# Patient Record
Sex: Female | Born: 1945
Health system: Southern US, Community
[De-identification: ages and names within clinical notes are randomized; demographics above are authoritative.]

## PROBLEM LIST (undated history)

## (undated) DIAGNOSIS — N289 Disorder of kidney and ureter, unspecified: Secondary | ICD-10-CM

## (undated) DIAGNOSIS — H3530 Unspecified macular degeneration: Secondary | ICD-10-CM

## (undated) DIAGNOSIS — M1990 Unspecified osteoarthritis, unspecified site: Secondary | ICD-10-CM

## (undated) DIAGNOSIS — Z972 Presence of dental prosthetic device (complete) (partial): Secondary | ICD-10-CM

## (undated) DIAGNOSIS — D649 Anemia, unspecified: Secondary | ICD-10-CM

## (undated) DIAGNOSIS — Z872 Personal history of diseases of the skin and subcutaneous tissue: Secondary | ICD-10-CM

## (undated) DIAGNOSIS — E7800 Pure hypercholesterolemia, unspecified: Secondary | ICD-10-CM

## (undated) DIAGNOSIS — C9000 Multiple myeloma not having achieved remission: Secondary | ICD-10-CM

## (undated) DIAGNOSIS — I10 Essential (primary) hypertension: Secondary | ICD-10-CM

## (undated) HISTORY — DX: Personal history of diseases of the skin and subcutaneous tissue: Z87.2

## (undated) HISTORY — PX: EXPLORATORY LAPAROTOMY: SUR591

## (undated) HISTORY — DX: Unspecified macular degeneration: H35.30

---

## 2005-01-22 ENCOUNTER — Ambulatory Visit: Payer: Self-pay | Admitting: Unknown Physician Specialty

## 2005-02-10 ENCOUNTER — Ambulatory Visit: Payer: Self-pay | Admitting: Unknown Physician Specialty

## 2006-03-11 ENCOUNTER — Ambulatory Visit: Payer: Self-pay | Admitting: Unknown Physician Specialty

## 2007-05-19 ENCOUNTER — Ambulatory Visit: Payer: Self-pay | Admitting: Unknown Physician Specialty

## 2008-05-24 ENCOUNTER — Ambulatory Visit: Payer: Self-pay | Admitting: Unknown Physician Specialty

## 2009-07-30 ENCOUNTER — Ambulatory Visit: Payer: Self-pay | Admitting: Unknown Physician Specialty

## 2010-08-15 ENCOUNTER — Ambulatory Visit: Payer: Self-pay | Admitting: Unknown Physician Specialty

## 2010-08-20 ENCOUNTER — Ambulatory Visit: Payer: Self-pay | Admitting: Internal Medicine

## 2011-10-05 ENCOUNTER — Ambulatory Visit: Payer: Self-pay | Admitting: Unknown Physician Specialty

## 2011-10-28 ENCOUNTER — Ambulatory Visit: Payer: Self-pay | Admitting: Unknown Physician Specialty

## 2013-04-04 DIAGNOSIS — N200 Calculus of kidney: Secondary | ICD-10-CM | POA: Insufficient documentation

## 2013-04-04 DIAGNOSIS — D4100 Neoplasm of uncertain behavior of unspecified kidney: Secondary | ICD-10-CM | POA: Insufficient documentation

## 2013-04-04 DIAGNOSIS — D4120 Neoplasm of uncertain behavior of unspecified ureter: Secondary | ICD-10-CM

## 2013-12-07 ENCOUNTER — Ambulatory Visit: Payer: Self-pay | Admitting: Unknown Physician Specialty

## 2013-12-11 LAB — PATHOLOGY REPORT

## 2014-01-06 ENCOUNTER — Emergency Department: Payer: Self-pay | Admitting: Emergency Medicine

## 2014-01-06 LAB — CBC WITH DIFFERENTIAL/PLATELET
BASOS PCT: 0.2 %
Basophil #: 0 10*3/uL (ref 0.0–0.1)
EOS ABS: 0 10*3/uL (ref 0.0–0.7)
Eosinophil %: 0.2 %
HCT: 40.9 % (ref 35.0–47.0)
HGB: 14 g/dL (ref 12.0–16.0)
Lymphocyte #: 0.4 10*3/uL — ABNORMAL LOW (ref 1.0–3.6)
Lymphocyte %: 3.8 %
MCH: 30.5 pg (ref 26.0–34.0)
MCHC: 34.3 g/dL (ref 32.0–36.0)
MCV: 89 fL (ref 80–100)
MONOS PCT: 3.7 %
Monocyte #: 0.4 x10 3/mm (ref 0.2–0.9)
Neutrophil #: 9.7 10*3/uL — ABNORMAL HIGH (ref 1.4–6.5)
Neutrophil %: 92.1 %
Platelet: 242 10*3/uL (ref 150–440)
RBC: 4.6 10*6/uL (ref 3.80–5.20)
RDW: 13.5 % (ref 11.5–14.5)
WBC: 10.6 10*3/uL (ref 3.6–11.0)

## 2014-01-06 LAB — URINALYSIS, COMPLETE
BILIRUBIN, UR: NEGATIVE
Bacteria: NONE SEEN
Blood: NEGATIVE
Glucose,UR: NEGATIVE mg/dL (ref 0–75)
Leukocyte Esterase: NEGATIVE
Nitrite: NEGATIVE
PH: 6 (ref 4.5–8.0)
Protein: NEGATIVE
RBC, UR: NONE SEEN /HPF (ref 0–5)
Specific Gravity: 1.025 (ref 1.003–1.030)
Squamous Epithelial: NONE SEEN
WBC UR: NONE SEEN /HPF (ref 0–5)

## 2014-01-06 LAB — COMPREHENSIVE METABOLIC PANEL
Albumin: 3.3 g/dL — ABNORMAL LOW (ref 3.4–5.0)
Alkaline Phosphatase: 74 U/L
Anion Gap: 7 (ref 7–16)
BUN: 16 mg/dL (ref 7–18)
Bilirubin,Total: 0.6 mg/dL (ref 0.2–1.0)
CALCIUM: 8.7 mg/dL (ref 8.5–10.1)
CHLORIDE: 101 mmol/L (ref 98–107)
CREATININE: 0.91 mg/dL (ref 0.60–1.30)
Co2: 24 mmol/L (ref 21–32)
EGFR (African American): >60
EGFR (Non-African Amer.): >60
Glucose: 128 mg/dL — ABNORMAL HIGH (ref 65–99)
Osmolality: 267 (ref 275–301)
Potassium: 3.7 mmol/L (ref 3.5–5.1)
SGOT(AST): 70 U/L — ABNORMAL HIGH (ref 15–37)
SGPT (ALT): 68 U/L (ref 12–78)
SODIUM: 132 mmol/L — AB (ref 136–145)
TOTAL PROTEIN: 7.6 g/dL (ref 6.4–8.2)

## 2014-01-06 LAB — LIPASE, BLOOD: Lipase: 103 U/L (ref 73–393)

## 2014-02-19 ENCOUNTER — Ambulatory Visit: Payer: Self-pay

## 2016-01-14 ENCOUNTER — Inpatient Hospital Stay
Admission: RE | Admit: 2016-01-14 | Discharge: 2016-01-14 | Disposition: A | Discharge status: Home / Self Care | Payer: Self-pay | Source: Ambulatory Visit | Attending: *Deleted | Admitting: *Deleted

## 2016-01-14 ENCOUNTER — Other Ambulatory Visit: Payer: Self-pay | Admitting: *Deleted

## 2016-01-14 ENCOUNTER — Other Ambulatory Visit: Payer: Self-pay | Admitting: Nurse Practitioner

## 2016-01-14 DIAGNOSIS — Z9289 Personal history of other medical treatment: Secondary | ICD-10-CM

## 2016-01-14 DIAGNOSIS — N63 Unspecified lump in unspecified breast: Secondary | ICD-10-CM

## 2016-01-14 DIAGNOSIS — N6489 Other specified disorders of breast: Secondary | ICD-10-CM

## 2016-01-24 ENCOUNTER — Ambulatory Visit
Admission: RE | Admit: 2016-01-24 | Discharge: 2016-01-24 | Disposition: A | Discharge status: Home / Self Care | Payer: Commercial Managed Care - HMO | Source: Ambulatory Visit | Attending: Nurse Practitioner | Admitting: Nurse Practitioner

## 2016-01-24 ENCOUNTER — Other Ambulatory Visit: Payer: Self-pay | Admitting: Nurse Practitioner

## 2016-01-24 DIAGNOSIS — N6489 Other specified disorders of breast: Secondary | ICD-10-CM

## 2016-01-24 DIAGNOSIS — N644 Mastodynia: Secondary | ICD-10-CM | POA: Diagnosis present

## 2016-01-24 DIAGNOSIS — N63 Unspecified lump in unspecified breast: Secondary | ICD-10-CM

## 2016-09-30 ENCOUNTER — Other Ambulatory Visit: Payer: Self-pay | Admitting: Nurse Practitioner

## 2016-09-30 DIAGNOSIS — Z1231 Encounter for screening mammogram for malignant neoplasm of breast: Secondary | ICD-10-CM

## 2016-09-30 DIAGNOSIS — E2839 Other primary ovarian failure: Secondary | ICD-10-CM

## 2016-10-29 DIAGNOSIS — E785 Hyperlipidemia, unspecified: Secondary | ICD-10-CM | POA: Insufficient documentation

## 2016-10-29 DIAGNOSIS — E782 Mixed hyperlipidemia: Secondary | ICD-10-CM | POA: Insufficient documentation

## 2016-11-06 ENCOUNTER — Emergency Department: Payer: Commercial Managed Care - HMO

## 2016-11-06 ENCOUNTER — Emergency Department
Admission: EM | Admit: 2016-11-06 | Discharge: 2016-11-06 | Disposition: A | Discharge status: Home / Self Care | Payer: Commercial Managed Care - HMO | Attending: Emergency Medicine | Admitting: Emergency Medicine

## 2016-11-06 ENCOUNTER — Encounter: Payer: Self-pay | Admitting: Emergency Medicine

## 2016-11-06 DIAGNOSIS — J069 Acute upper respiratory infection, unspecified: Secondary | ICD-10-CM | POA: Insufficient documentation

## 2016-11-06 DIAGNOSIS — R05 Cough: Secondary | ICD-10-CM | POA: Diagnosis present

## 2016-11-06 HISTORY — DX: Disorder of kidney and ureter, unspecified: N28.9

## 2016-11-06 HISTORY — DX: Anemia, unspecified: D64.9

## 2016-11-06 HISTORY — DX: Pure hypercholesterolemia, unspecified: E78.00

## 2016-11-06 HISTORY — DX: Multiple myeloma not having achieved remission: C90.00

## 2016-11-06 HISTORY — DX: Essential (primary) hypertension: I10

## 2016-11-06 LAB — CBC WITH DIFFERENTIAL/PLATELET
Basophils Absolute: 0.1 10*3/uL (ref 0–0.1)
Basophils Relative: 1 %
EOS PCT: 1 %
Eosinophils Absolute: 0.1 10*3/uL (ref 0–0.7)
HCT: 32.7 % — ABNORMAL LOW (ref 35.0–47.0)
Hemoglobin: 11 g/dL — ABNORMAL LOW (ref 12.0–16.0)
LYMPHS ABS: 3 10*3/uL (ref 1.0–3.6)
LYMPHS PCT: 21 %
MCH: 32.1 pg (ref 26.0–34.0)
MCHC: 33.5 g/dL (ref 32.0–36.0)
MCV: 95.9 fL (ref 80.0–100.0)
MONO ABS: 1.2 10*3/uL — AB (ref 0.2–0.9)
MONOS PCT: 8 %
Neutro Abs: 10.4 10*3/uL — ABNORMAL HIGH (ref 1.4–6.5)
Neutrophils Relative %: 69 %
PLATELETS: 236 10*3/uL (ref 150–440)
RBC: 3.41 MIL/uL — ABNORMAL LOW (ref 3.80–5.20)
RDW: 14.4 % (ref 11.5–14.5)
WBC: 14.8 10*3/uL — ABNORMAL HIGH (ref 3.6–11.0)

## 2016-11-06 LAB — COMPREHENSIVE METABOLIC PANEL
ALT: 17 U/L (ref 14–54)
ANION GAP: 5 (ref 5–15)
AST: 31 U/L (ref 15–41)
Albumin: 3.3 g/dL — ABNORMAL LOW (ref 3.5–5.0)
Alkaline Phosphatase: 56 U/L (ref 38–126)
BUN: 11 mg/dL (ref 6–20)
CHLORIDE: 104 mmol/L (ref 101–111)
CO2: 23 mmol/L (ref 22–32)
CREATININE: 1.2 mg/dL — AB (ref 0.44–1.00)
Calcium: 9.8 mg/dL (ref 8.9–10.3)
GFR, EST AFRICAN AMERICAN: 52 mL/min — AB (ref >60)
GFR, EST NON AFRICAN AMERICAN: 45 mL/min — AB (ref >60)
Glucose, Bld: 109 mg/dL — ABNORMAL HIGH (ref 65–99)
POTASSIUM: 3.9 mmol/L (ref 3.5–5.1)
Sodium: 132 mmol/L — ABNORMAL LOW (ref 135–145)
Total Bilirubin: 0.3 mg/dL (ref 0.3–1.2)
Total Protein: 11.1 g/dL — ABNORMAL HIGH (ref 6.5–8.1)

## 2016-11-06 LAB — TROPONIN I: Troponin I: <0.03 ng/mL (ref <0.03)

## 2016-11-06 MED ORDER — ALBUTEROL SULFATE HFA 108 (90 BASE) MCG/ACT IN AERS: 2.0000 | INHALATION_SPRAY | Freq: Four times a day (QID) | RESPIRATORY_TRACT | 0 refills | Status: DC | PRN

## 2016-11-06 MED ORDER — IPRATROPIUM-ALBUTEROL 0.5-2.5 (3) MG/3ML IN SOLN
3.0000 mL | Freq: Once | RESPIRATORY_TRACT | Status: AC
  Administered 2016-11-06: 3 mL via RESPIRATORY_TRACT
  Filled 2016-11-06: qty 3

## 2016-11-06 MED ORDER — AZITHROMYCIN 250 MG PO TABS: ORAL_TABLET | ORAL | 0 refills | Status: AC

## 2016-11-06 NOTE — ED Triage Notes (Signed)
Patient presents with productive cough, nasal congestion, fever since monday. Patient states she took 593m tylenol at 0700 this morning. Patient afebrile now. Patient states she was recently diagnosed with multiple myeloma but it has not been staged yet, awaiting pet scan. ([facility]

## 2016-11-06 NOTE — ED Provider Notes (Signed)
[facility] Emergency Department Provider Note   ____________________________________________   I have reviewed the triage vital signs and the nursing notes.   HISTORY  Chief Complaint Cough   History limited by: Not Limited   HPI Sherry Oneal is a 70 y.o. female who presents to the emergency department today because of concern for cough, congestion and fever. The patient states that the symptoms started roughly 4 days ago. She initially was having congestion in her head but feels like it moved down to her chest last night. The patient has had a cough since last night. When she coughs she feels a burning sensation in the middle of her chest. She has had fever up to 101. Took tylenol for the fever this morning.    Past Medical History:  Diagnosis Date  . Anemia   . High blood pressure   . High cholesterol   . Multiple myeloma ([place])   . Renal insufficiency     There are no active problems to display for this patient.   Past Surgical History:  Procedure Laterality Date  . EXPLORATORY LAPAROTOMY      Prior to Admission medications   Not on File    Allergies Atenolol; Doxycycline; Epinephrine; Nsaids; Penicillins; Sulfa antibiotics; and Tramadol  Family History  Problem Relation Age of Onset  . Breast cancer Maternal Grandmother 70    Social History Social History  Substance Use Topics  . Smoking status: Never Smoker  . Smokeless tobacco: Never Used  . Alcohol use No    Review of Systems  Constitutional: Negative for fever. Cardiovascular: Positive for burning sensation. Respiratory: Positive for cough. Gastrointestinal: Negative for abdominal pain, vomiting and diarrhea. Genitourinary: Negative for dysuria. Musculoskeletal: Negative for back pain. Skin: Negative for rash. Neurological: Negative for headaches, focal weakness or numbness.  10-point ROS otherwise  negative.  ____________________________________________   PHYSICAL EXAM:  VITAL SIGNS: ED Triage Vitals  Enc Vitals Group     BP 11/06/16 0809 (!) 123/45     Pulse --      Resp 11/06/16 0809 20     Temp 11/06/16 0809 98.3 F (36.8 C)     Temp Source 11/06/16 0809 Oral     SpO2 11/06/16 0809 99 %     Weight 11/06/16 0809 138 lb (62.6 kg)     Height 11/06/16 0809 _0  (1.626 m)     Head Circumference --      Peak Flow --      Pain Score 11/06/16 0810 6   Constitutional: Alert and oriented. Well appearing and in no distress. Eyes: Conjunctivae are normal. Normal extraocular movements. ENT   Head: Normocephalic and atraumatic.   Nose: No congestion/rhinnorhea.   Mouth/Throat: Mucous membranes are moist.   Neck: No stridor. Hematological/Lymphatic/Immunilogical: No cervical lymphadenopathy. Cardiovascular: Normal rate, regular rhythm.  No murmurs, rubs, or gallops.  Respiratory: Normal respiratory effort without tachypnea nor retractions. Breath sounds are clear and equal bilaterally. No wheezes/rales/rhonchi. Occasional dry cough. Gastrointestinal: Soft and nontender. No distention.  Genitourinary: Deferred Musculoskeletal: Normal range of motion in all extremities. No lower extremity edema. Neurologic:  Normal speech and language. No gross focal neurologic deficits are appreciated.  Skin:  Skin is warm, dry and intact. No rash noted. Psychiatric: Mood and affect are normal. Speech and behavior are normal. Patient exhibits appropriate insight and judgment.  ____________________________________________    LABS (pertinent positives/negatives)  Labs Reviewed  CBC WITH DIFFERENTIAL/PLATELET - Abnormal; Notable for the following:  Result Value   WBC 14.8 (*)    RBC 3.41 (*)    Hemoglobin 11.0 (*)    HCT 32.7 (*)    Neutro Abs 10.4 (*)    Monocytes Absolute 1.2 (*)    All other components within normal limits  COMPREHENSIVE METABOLIC PANEL - Abnormal;  Notable for the following:    Sodium 132 (*)    Glucose, Bld 109 (*)    Creatinine, Ser 1.20 (*)    Total Protein 11.1 (*)    Albumin 3.3 (*)    GFR calc non Af Amer 45 (*)    GFR calc Af Amer 52 (*)    All other components within normal limits  TROPONIN I      ____________________________________________    RADIOLOGY  CXR IMPRESSION:  No acute abnormality.    ____________________________________________   PROCEDURES  Procedures  ____________________________________________   INITIAL IMPRESSION / ASSESSMENT AND PLAN / ED COURSE  Pertinent labs & imaging results that were available during my care of the patient were reviewed by me and considered in my medical decision making (see chart for details).  Patient here with congestion, cough, fever. X-ray without pneumonia, patient with mild elevation of WBCs. She did state she felt better after duoneb treatment. Will discharge with albuterol inhaler and z-pak.  ____________________________________________   FINAL CLINICAL IMPRESSION(S) / ED DIAGNOSES  Final diagnoses:  Upper respiratory tract infection, unspecified type     Note: This dictation was prepared with Dragon dictation. Any transcriptional errors that result from this process are unintentional    Nance Pear, MD 11/06/16 1129

## 2016-11-06 NOTE — ED Notes (Signed)
Patient reports cough since Tuesday. Patient states that it burns her chest. Patient states that she has been running fever, temperature was up to 101 last pm. Patient took tylenol for fever. Patient states that cough is mostly non-productive but sometimes she does get up white sputum. Patient has had some shortness of breath this morning when she had a coughing spell.

## 2016-11-06 NOTE — Discharge Instructions (Signed)
Please seek medical attention for any high fevers, chest pain, shortness of breath, change in behavior, persistent vomiting, bloody stool or any other new or concerning symptoms.  

## 2016-12-29 DIAGNOSIS — T380X5D Adverse effect of glucocorticoids and synthetic analogues, subsequent encounter: Secondary | ICD-10-CM | POA: Diagnosis not present

## 2016-12-29 DIAGNOSIS — E785 Hyperlipidemia, unspecified: Secondary | ICD-10-CM | POA: Diagnosis not present

## 2016-12-29 DIAGNOSIS — G4709 Other insomnia: Secondary | ICD-10-CM | POA: Diagnosis not present

## 2016-12-29 DIAGNOSIS — I10 Essential (primary) hypertension: Secondary | ICD-10-CM | POA: Diagnosis not present

## 2016-12-29 DIAGNOSIS — E041 Nontoxic single thyroid nodule: Secondary | ICD-10-CM | POA: Diagnosis not present

## 2016-12-29 DIAGNOSIS — K5909 Other constipation: Secondary | ICD-10-CM | POA: Diagnosis not present

## 2016-12-29 DIAGNOSIS — N2889 Other specified disorders of kidney and ureter: Secondary | ICD-10-CM | POA: Diagnosis not present

## 2016-12-29 DIAGNOSIS — C9000 Multiple myeloma not having achieved remission: Secondary | ICD-10-CM | POA: Diagnosis not present

## 2016-12-29 DIAGNOSIS — Z5112 Encounter for antineoplastic immunotherapy: Secondary | ICD-10-CM | POA: Diagnosis not present

## 2016-12-29 DIAGNOSIS — R897 Abnormal histological findings in specimens from other organs, systems and tissues: Secondary | ICD-10-CM | POA: Diagnosis not present

## 2016-12-30 DIAGNOSIS — R69 Illness, unspecified: Secondary | ICD-10-CM | POA: Diagnosis not present

## 2017-01-01 DIAGNOSIS — C9000 Multiple myeloma not having achieved remission: Secondary | ICD-10-CM | POA: Diagnosis not present

## 2017-01-01 DIAGNOSIS — Z880 Allergy status to penicillin: Secondary | ICD-10-CM | POA: Diagnosis not present

## 2017-01-01 DIAGNOSIS — Z5112 Encounter for antineoplastic immunotherapy: Secondary | ICD-10-CM | POA: Diagnosis not present

## 2017-01-01 DIAGNOSIS — Z885 Allergy status to narcotic agent status: Secondary | ICD-10-CM | POA: Diagnosis not present

## 2017-01-01 DIAGNOSIS — Z882 Allergy status to sulfonamides status: Secondary | ICD-10-CM | POA: Diagnosis not present

## 2017-01-01 DIAGNOSIS — L509 Urticaria, unspecified: Secondary | ICD-10-CM | POA: Diagnosis not present

## 2017-01-01 DIAGNOSIS — N261 Atrophy of kidney (terminal): Secondary | ICD-10-CM | POA: Diagnosis not present

## 2017-01-01 DIAGNOSIS — L299 Pruritus, unspecified: Secondary | ICD-10-CM | POA: Diagnosis not present

## 2017-01-05 DIAGNOSIS — Z880 Allergy status to penicillin: Secondary | ICD-10-CM | POA: Diagnosis not present

## 2017-01-05 DIAGNOSIS — Z882 Allergy status to sulfonamides status: Secondary | ICD-10-CM | POA: Diagnosis not present

## 2017-01-05 DIAGNOSIS — Z886 Allergy status to analgesic agent status: Secondary | ICD-10-CM | POA: Diagnosis not present

## 2017-01-05 DIAGNOSIS — Z885 Allergy status to narcotic agent status: Secondary | ICD-10-CM | POA: Diagnosis not present

## 2017-01-05 DIAGNOSIS — C9000 Multiple myeloma not having achieved remission: Secondary | ICD-10-CM | POA: Diagnosis not present

## 2017-01-08 DIAGNOSIS — C9000 Multiple myeloma not having achieved remission: Secondary | ICD-10-CM | POA: Diagnosis not present

## 2017-01-08 DIAGNOSIS — Z5111 Encounter for antineoplastic chemotherapy: Secondary | ICD-10-CM | POA: Diagnosis not present

## 2017-01-19 DIAGNOSIS — N289 Disorder of kidney and ureter, unspecified: Secondary | ICD-10-CM | POA: Diagnosis not present

## 2017-01-19 DIAGNOSIS — E041 Nontoxic single thyroid nodule: Secondary | ICD-10-CM | POA: Diagnosis not present

## 2017-01-19 DIAGNOSIS — Z880 Allergy status to penicillin: Secondary | ICD-10-CM | POA: Diagnosis not present

## 2017-01-19 DIAGNOSIS — R110 Nausea: Secondary | ICD-10-CM | POA: Diagnosis not present

## 2017-01-19 DIAGNOSIS — C9000 Multiple myeloma not having achieved remission: Secondary | ICD-10-CM | POA: Diagnosis not present

## 2017-01-19 DIAGNOSIS — Z886 Allergy status to analgesic agent status: Secondary | ICD-10-CM | POA: Diagnosis not present

## 2017-01-19 DIAGNOSIS — I10 Essential (primary) hypertension: Secondary | ICD-10-CM | POA: Diagnosis not present

## 2017-01-19 DIAGNOSIS — E785 Hyperlipidemia, unspecified: Secondary | ICD-10-CM | POA: Diagnosis not present

## 2017-01-19 DIAGNOSIS — G4700 Insomnia, unspecified: Secondary | ICD-10-CM | POA: Diagnosis not present

## 2017-01-19 DIAGNOSIS — Z5112 Encounter for antineoplastic immunotherapy: Secondary | ICD-10-CM | POA: Diagnosis not present

## 2017-01-22 DIAGNOSIS — Z5112 Encounter for antineoplastic immunotherapy: Secondary | ICD-10-CM | POA: Diagnosis not present

## 2017-01-22 DIAGNOSIS — C9000 Multiple myeloma not having achieved remission: Secondary | ICD-10-CM | POA: Diagnosis not present

## 2017-01-22 DIAGNOSIS — Z79899 Other long term (current) drug therapy: Secondary | ICD-10-CM | POA: Diagnosis not present

## 2017-01-25 ENCOUNTER — Ambulatory Visit
Admission: RE | Admit: 2017-01-25 | Discharge: 2017-01-25 | Disposition: A | Discharge status: Home / Self Care | Payer: Medicare HMO | Source: Ambulatory Visit | Attending: Nurse Practitioner | Admitting: Nurse Practitioner

## 2017-01-25 DIAGNOSIS — Z1231 Encounter for screening mammogram for malignant neoplasm of breast: Secondary | ICD-10-CM | POA: Diagnosis not present

## 2017-01-25 DIAGNOSIS — E2839 Other primary ovarian failure: Secondary | ICD-10-CM

## 2017-01-26 DIAGNOSIS — Z5112 Encounter for antineoplastic immunotherapy: Secondary | ICD-10-CM | POA: Diagnosis not present

## 2017-01-26 DIAGNOSIS — C9000 Multiple myeloma not having achieved remission: Secondary | ICD-10-CM | POA: Diagnosis not present

## 2017-01-29 DIAGNOSIS — Z5112 Encounter for antineoplastic immunotherapy: Secondary | ICD-10-CM | POA: Diagnosis not present

## 2017-01-29 DIAGNOSIS — Z880 Allergy status to penicillin: Secondary | ICD-10-CM | POA: Diagnosis not present

## 2017-01-29 DIAGNOSIS — I10 Essential (primary) hypertension: Secondary | ICD-10-CM | POA: Diagnosis not present

## 2017-01-29 DIAGNOSIS — Z882 Allergy status to sulfonamides status: Secondary | ICD-10-CM | POA: Diagnosis not present

## 2017-01-29 DIAGNOSIS — E785 Hyperlipidemia, unspecified: Secondary | ICD-10-CM | POA: Diagnosis not present

## 2017-01-29 DIAGNOSIS — Z79899 Other long term (current) drug therapy: Secondary | ICD-10-CM | POA: Diagnosis not present

## 2017-01-29 DIAGNOSIS — E041 Nontoxic single thyroid nodule: Secondary | ICD-10-CM | POA: Diagnosis not present

## 2017-01-29 DIAGNOSIS — C9000 Multiple myeloma not having achieved remission: Secondary | ICD-10-CM | POA: Diagnosis not present

## 2017-01-29 DIAGNOSIS — N289 Disorder of kidney and ureter, unspecified: Secondary | ICD-10-CM | POA: Diagnosis not present

## 2017-01-29 DIAGNOSIS — Z7901 Long term (current) use of anticoagulants: Secondary | ICD-10-CM | POA: Diagnosis not present

## 2017-01-30 ENCOUNTER — Emergency Department: Payer: Medicare HMO

## 2017-01-30 ENCOUNTER — Observation Stay: Payer: Medicare HMO

## 2017-01-30 ENCOUNTER — Observation Stay
Admission: EM | Admit: 2017-01-30 | Discharge: 2017-02-01 | Disposition: A | Discharge status: Home / Self Care | Payer: Medicare HMO | Attending: Internal Medicine | Admitting: Internal Medicine

## 2017-01-30 DIAGNOSIS — E7800 Pure hypercholesterolemia, unspecified: Secondary | ICD-10-CM | POA: Insufficient documentation

## 2017-01-30 DIAGNOSIS — D62 Acute posthemorrhagic anemia: Secondary | ICD-10-CM

## 2017-01-30 DIAGNOSIS — K922 Gastrointestinal hemorrhage, unspecified: Secondary | ICD-10-CM

## 2017-01-30 DIAGNOSIS — I10 Essential (primary) hypertension: Secondary | ICD-10-CM | POA: Diagnosis not present

## 2017-01-30 DIAGNOSIS — R197 Diarrhea, unspecified: Secondary | ICD-10-CM | POA: Diagnosis present

## 2017-01-30 DIAGNOSIS — F419 Anxiety disorder, unspecified: Secondary | ICD-10-CM | POA: Insufficient documentation

## 2017-01-30 DIAGNOSIS — K629 Disease of anus and rectum, unspecified: Secondary | ICD-10-CM | POA: Diagnosis not present

## 2017-01-30 DIAGNOSIS — K529 Noninfective gastroenteritis and colitis, unspecified: Secondary | ICD-10-CM | POA: Diagnosis not present

## 2017-01-30 DIAGNOSIS — Z7952 Long term (current) use of systemic steroids: Secondary | ICD-10-CM | POA: Diagnosis not present

## 2017-01-30 DIAGNOSIS — R69 Illness, unspecified: Secondary | ICD-10-CM | POA: Diagnosis not present

## 2017-01-30 DIAGNOSIS — D630 Anemia in neoplastic disease: Secondary | ICD-10-CM | POA: Diagnosis not present

## 2017-01-30 DIAGNOSIS — R109 Unspecified abdominal pain: Secondary | ICD-10-CM | POA: Diagnosis not present

## 2017-01-30 DIAGNOSIS — C9000 Multiple myeloma not having achieved remission: Secondary | ICD-10-CM | POA: Diagnosis not present

## 2017-01-30 DIAGNOSIS — K921 Melena: Secondary | ICD-10-CM | POA: Diagnosis not present

## 2017-01-30 DIAGNOSIS — K625 Hemorrhage of anus and rectum: Secondary | ICD-10-CM

## 2017-01-30 DIAGNOSIS — Z79899 Other long term (current) drug therapy: Secondary | ICD-10-CM | POA: Diagnosis not present

## 2017-01-30 LAB — HEMOGLOBIN: Hemoglobin: 9.4 g/dL — ABNORMAL LOW (ref 12.0–16.0)

## 2017-01-30 LAB — COMPREHENSIVE METABOLIC PANEL
ALBUMIN: 3.1 g/dL — AB (ref 3.5–5.0)
ALT: 22 U/L (ref 14–54)
AST: 26 U/L (ref 15–41)
Alkaline Phosphatase: 64 U/L (ref 38–126)
Anion gap: 6 (ref 5–15)
BUN: 24 mg/dL — AB (ref 6–20)
CHLORIDE: 104 mmol/L (ref 101–111)
CO2: 20 mmol/L — ABNORMAL LOW (ref 22–32)
Calcium: 8.4 mg/dL — ABNORMAL LOW (ref 8.9–10.3)
Creatinine, Ser: 1.14 mg/dL — ABNORMAL HIGH (ref 0.44–1.00)
GFR calc Af Amer: 55 mL/min — ABNORMAL LOW (ref >60)
GFR calc non Af Amer: 48 mL/min — ABNORMAL LOW (ref >60)
GLUCOSE: 122 mg/dL — AB (ref 65–99)
POTASSIUM: 3.3 mmol/L — AB (ref 3.5–5.1)
SODIUM: 130 mmol/L — AB (ref 135–145)
Total Bilirubin: 0.5 mg/dL (ref 0.3–1.2)
Total Protein: 9.1 g/dL — ABNORMAL HIGH (ref 6.5–8.1)

## 2017-01-30 LAB — CBC
HEMATOCRIT: 29.7 % — AB (ref 35.0–47.0)
Hemoglobin: 10.4 g/dL — ABNORMAL LOW (ref 12.0–16.0)
MCH: 33.6 pg (ref 26.0–34.0)
MCHC: 35 g/dL (ref 32.0–36.0)
MCV: 95.8 fL (ref 80.0–100.0)
Platelets: 183 10*3/uL (ref 150–440)
RBC: 3.1 MIL/uL — ABNORMAL LOW (ref 3.80–5.20)
RDW: 16.5 % — AB (ref 11.5–14.5)
WBC: 13.7 10*3/uL — AB (ref 3.6–11.0)

## 2017-01-30 LAB — TYPE AND SCREEN
ABO/RH(D): O POS
Antibody Screen: NEGATIVE

## 2017-01-30 LAB — APTT: APTT: 25 s (ref 24–36)

## 2017-01-30 LAB — PROTIME-INR
INR: 1.02
Prothrombin Time: 13.4 seconds (ref 11.4–15.2)

## 2017-01-30 MED ORDER — POTASSIUM CHLORIDE IN NACL 20-0.9 MEQ/L-% IV SOLN
INTRAVENOUS | Status: DC
  Administered 2017-01-30 – 2017-01-31 (×3): via INTRAVENOUS
  Filled 2017-01-30 (×6): qty 1000

## 2017-01-30 MED ORDER — FENTANYL CITRATE (PF) 100 MCG/2ML IJ SOLN: 50.0000 ug | Freq: Once | INTRAMUSCULAR | Status: DC

## 2017-01-30 MED ORDER — ONDANSETRON HCL 4 MG PO TABS
4.0000 mg | ORAL_TABLET | Freq: Four times a day (QID) | ORAL | Status: DC | PRN
  Administered 2017-01-30: 4 mg via ORAL
  Filled 2017-01-30: qty 1

## 2017-01-30 MED ORDER — ALBUTEROL SULFATE (2.5 MG/3ML) 0.083% IN NEBU: 3.0000 mL | INHALATION_SOLUTION | Freq: Four times a day (QID) | RESPIRATORY_TRACT | Status: DC | PRN

## 2017-01-30 MED ORDER — ACETAMINOPHEN 650 MG RE SUPP: 650.0000 mg | Freq: Four times a day (QID) | RECTAL | Status: DC | PRN

## 2017-01-30 MED ORDER — VALACYCLOVIR HCL 500 MG PO TABS
500.0000 mg | ORAL_TABLET | Freq: Every day | ORAL | Status: DC
Start: 2017-01-30 — End: 2017-02-01
  Administered 2017-01-30 – 2017-02-01 (×3): 500 mg via ORAL
  Filled 2017-01-30 (×3): qty 1

## 2017-01-30 MED ORDER — POTASSIUM CHLORIDE IN NACL 20-0.9 MEQ/L-% IV SOLN
INTRAVENOUS | Status: AC
  Filled 2017-01-30: qty 1000

## 2017-01-30 MED ORDER — LISINOPRIL 10 MG PO TABS
10.0000 mg | ORAL_TABLET | Freq: Every day | ORAL | Status: DC
Start: 2017-01-30 — End: 2017-02-01
  Administered 2017-01-30 – 2017-02-01 (×3): 10 mg via ORAL
  Filled 2017-01-30 (×3): qty 1

## 2017-01-30 MED ORDER — ALPRAZOLAM 0.25 MG PO TABS
0.2500 mg | ORAL_TABLET | Freq: Three times a day (TID) | ORAL | Status: DC | PRN
  Administered 2017-01-30: 19:00:00 0.25 mg via ORAL
  Filled 2017-01-30: qty 1

## 2017-01-30 MED ORDER — BISACODYL 10 MG RE SUPP: 10.0000 mg | Freq: Every day | RECTAL | Status: DC | PRN

## 2017-01-30 MED ORDER — DOCUSATE SODIUM 100 MG PO CAPS
100.0000 mg | ORAL_CAPSULE | Freq: Two times a day (BID) | ORAL | Status: DC
  Filled 2017-01-30 (×2): qty 1

## 2017-01-30 MED ORDER — CIPROFLOXACIN IN D5W 400 MG/200ML IV SOLN
400.0000 mg | Freq: Two times a day (BID) | INTRAVENOUS | Status: DC
  Administered 2017-01-30 – 2017-02-01 (×5): 400 mg via INTRAVENOUS
  Filled 2017-01-30 (×6): qty 200

## 2017-01-30 MED ORDER — METRONIDAZOLE IN NACL 5-0.79 MG/ML-% IV SOLN
500.0000 mg | Freq: Three times a day (TID) | INTRAVENOUS | Status: DC
  Administered 2017-01-30 – 2017-02-01 (×6): 500 mg via INTRAVENOUS
  Filled 2017-01-30 (×9): qty 100

## 2017-01-30 MED ORDER — IOPAMIDOL (ISOVUE-300) INJECTION 61%: 30.0000 mL | Freq: Once | INTRAVENOUS | Status: DC | PRN

## 2017-01-30 MED ORDER — BARIUM SULFATE 2.1 % PO SUSP
450.0000 mL | ORAL | Status: AC
  Administered 2017-01-30 (×2): 450 mL via ORAL

## 2017-01-30 MED ORDER — ACETAMINOPHEN 325 MG PO TABS
650.0000 mg | ORAL_TABLET | Freq: Four times a day (QID) | ORAL | Status: DC | PRN
  Administered 2017-01-30 – 2017-01-31 (×3): 650 mg via ORAL
  Filled 2017-01-30 (×3): qty 2

## 2017-01-30 MED ORDER — ACETAMINOPHEN 325 MG PO TABS
650.0000 mg | ORAL_TABLET | Freq: Once | ORAL | Status: AC
  Administered 2017-01-30: 650 mg via ORAL
  Filled 2017-01-30: qty 2

## 2017-01-30 MED ORDER — OXYCODONE HCL 5 MG PO TABS: 5.0000 mg | ORAL_TABLET | Freq: Once | ORAL | Status: DC

## 2017-01-30 MED ORDER — ONDANSETRON HCL 4 MG/2ML IJ SOLN
4.0000 mg | Freq: Four times a day (QID) | INTRAMUSCULAR | Status: DC | PRN
  Administered 2017-01-30: 4 mg via INTRAVENOUS
  Filled 2017-01-30: qty 2

## 2017-01-30 MED ORDER — PANTOPRAZOLE SODIUM 40 MG PO TBEC
40.0000 mg | DELAYED_RELEASE_TABLET | Freq: Every day | ORAL | Status: DC
  Administered 2017-01-31 – 2017-02-01 (×2): 40 mg via ORAL
  Filled 2017-01-30 (×2): qty 1

## 2017-01-30 NOTE — ED Notes (Signed)
Report given to Maddie, RN on floor. Pt to be transferred after CT Scan. She is about to go in a few minutes. Husband called per pt's request to let him know what the patients room assignment.

## 2017-01-30 NOTE — ED Triage Notes (Signed)
Pt came to ED c/o diarrhea, abdominal pain and rectal bleeding this morning. Pt has multiple myolemma and was seen yesterday for chemo (end of 3rd round). Pt reports was having trouble with constipation so started taking miralax and having diarrhea.

## 2017-01-30 NOTE — Care Management Obs Status (Signed)
[place] NOTIFICATION   Patient Details  Name: Sherry Oneal MRN: [id number] Date of Birth: 23-Aug-1946   Medicare Observation Status Notification Given:  Yes    [name], RN 01/30/2017, 4:13 PM

## 2017-01-30 NOTE — H&P (Signed)
History and Physical    Sherry Oneal [id number] DOB: 09-Oct-1946 DOA: 01/30/2017  Referring physician: Dr. Burlene Arnt PCP: Lavera Guise, MD  Specialists: none  Chief Complaint: lower abdominal pain with bleeding  HPI: Sherry Oneal is a 71 y.o. female has a past medical history significant for multiple myeloma on chemo and chronic Lovenox now with lower abdominal pain with diarrhea and rectal bleeding. Pt c/o fever and chills. In ER, rectal bleeding noted. Pt tachycardic and hgb lower than baseline. She is now admitted. No n/V. Denies CP or SOB. No cough. Last Lovenox shot was yesterday  Review of Systems: The patient denies anorexia,  weight loss,, vision loss, decreased hearing, hoarseness, chest pain, syncope, dyspnea on exertion, peripheral edema, balance deficits, hemoptysis,  Melena, severe indigestion/heartburn, hematuria, incontinence, genital sores, muscle weakness, suspicious skin lesions, transient blindness, difficulty walking, depression, unusual weight change, abnormal bleeding, enlarged lymph nodes, angioedema, and breast masses.   Past Medical History:  Diagnosis Date  . Anemia   . High blood pressure   . High cholesterol   . Multiple myeloma ([place])   . Renal insufficiency    Past Surgical History:  Procedure Laterality Date  . EXPLORATORY LAPAROTOMY     Social History:  reports that she has never smoked. She has never used smokeless tobacco. She reports that she does not drink alcohol or use drugs.  Allergies  Allergen Reactions  . Atenolol     "wierd feeling"  . Doxycycline     Upset stomach   . Epinephrine     "Shaking"  . Nsaids     Bleeding  . Penicillins     rash  . Sulfa Antibiotics     Rash   . Tramadol     Numb feeling     Family History  Problem Relation Age of Onset  . Breast cancer Maternal Grandmother 70    Prior to Admission medications   Medication Sig Start Date End Date Taking? Authorizing Provider  albuterol (PROVENTIL  HFA;VENTOLIN HFA) 108 (90 Base) MCG/ACT inhaler Inhale 2 puffs into the lungs every 6 (six) hours as needed for wheezing or shortness of breath. 11/06/16   Nance Pear, MD   Physical Exam: Vitals:   01/30/17 0957 01/30/17 0958  BP: (!) 121/47   Pulse: (!) 105   Resp: 16   Temp: 97.7 F (36.5 C)   SpO2: 99%   Weight:  62.6 kg (138 lb)  Height:  _0  (1.626 m)     General:  No apparent distress, WDWN, [place]/AT  Eyes: PERRL, EOMI, no scleral icterus, conjunctiva clear  ENT: moist oropharynx without exudate, TM's benign, dentition good  Neck: supple, no lymphadenopathy. No bruits or thyromegaly  Cardiovascular: rapid rate with regular rhythm without MRG; 2+ peripheral pulses, no JVD, no peripheral edema  Respiratory: CTA biL, good air movement without wheezing, rhonchi or crackled. Respiratory effort normal  Abdomen: soft, non tender to palpation, positive bowel sounds, no guarding, no rebound  Skin: no rashes or lesions  Musculoskeletal: normal bulk and tone, no joint swelling  Psychiatric: normal mood and affect, A&OX3  Neurologic: CN 2-12 grossly intact, Motor strength 5/5 in all 4 groups with symmetric DTR's and non-focal sensory exam  Labs on Admission:  Basic Metabolic Panel:  Recent Labs Lab 01/30/17 1027  NA 130*  K 3.3*  CL 104  CO2 20*  GLUCOSE 122*  BUN 24*  CREATININE 1.14*  CALCIUM 8.4*   Liver Function Tests:  Recent Labs  Lab 01/30/17 1027  AST 26  ALT 22  ALKPHOS 64  BILITOT 0.5  PROT 9.1*  ALBUMIN 3.1*   No results for input(s): LIPASE, AMYLASE in the last 168 hours. No results for input(s): AMMONIA in the last 168 hours. CBC:  Recent Labs Lab 01/30/17 1027  WBC 13.7*  HGB 10.4*  HCT 29.7*  MCV 95.8  PLT 183   Cardiac Enzymes: No results for input(s): CKTOTAL, CKMB, CKMBINDEX, TROPONINI in the last 168 hours.  BNP (last 3 results) No results for input(s): BNP in the last 8760 hours.  ProBNP (last 3 results) No results  for input(s): PROBNP in the last 8760 hours.  CBG: No results for input(s): GLUCAP in the last 168 hours.  Radiological Exams on Admission: No results found.  EKG: Independently reviewed.  Assessment/Plan Principal Problem:   LGI bleed Active Problems:   Acute blood loss anemia   Abdominal pain   Multiple myeloma ([place])   Will admit to floor with IV fluids and empiric IV ABX. Guaiac stools and follow hgb closely. Hold Lovenox. Consult Oncology and GI. Repeat labs in AM.  Diet: clear liquids Fluids: NS_0  with K+ DVT Prophylaxis: none  Code Status: FULL  Family Communication: yes  Disposition Plan: home  Time spent: 55 min

## 2017-01-30 NOTE — ED Notes (Signed)
Pt escorted to bedside commode.

## 2017-01-30 NOTE — ED Provider Notes (Signed)
[facility] Emergency Department Provider Note  ____________________________________________   I have reviewed the triage vital signs and the nursing notes.   HISTORY  Chief Complaint Diarrhea and Abdominal Pain    HPI Sherry Oneal is a 71 y.o. female with a history remotely of upper GI bleed on aspirin since that time as never had aspirin or NSAIDs. Patient however is currently on Lovenox because she is getting treatment for multiple myeloma and is at some risk of clotting. She had her last Lovenox dose yesterday morning. She states she had some diarrhea. She has been constipated and started on MiraLAX 2 weeks ago. This morning she had multiple runny watery diarrheas were not melanotic or bloody and then she began to pass blood per rectum. She states she had 5 episodes of bloody stool. She states she's had some mild abdominal cramping but no significant abdominal pain. No hematemesis. No fever no chills. Did not take her dose of Lovenox this morning.       Past Medical History:  Diagnosis Date  . Anemia   . High blood pressure   . High cholesterol   . Multiple myeloma ([place])   . Renal insufficiency     There are no active problems to display for this patient.   Past Surgical History:  Procedure Laterality Date  . EXPLORATORY LAPAROTOMY      Prior to Admission medications   Medication Sig Start Date End Date Taking? Authorizing Provider  albuterol (PROVENTIL HFA;VENTOLIN HFA) 108 (90 Base) MCG/ACT inhaler Inhale 2 puffs into the lungs every 6 (six) hours as needed for wheezing or shortness of breath. 11/06/16   Nance Pear, MD    Allergies Atenolol; Doxycycline; Epinephrine; Nsaids; Penicillins; Sulfa antibiotics; and Tramadol  Family History  Problem Relation Age of Onset  . Breast cancer Maternal Grandmother 70    Social History Social History  Substance Use Topics  . Smoking status: Never Smoker  . Smokeless tobacco: Never Used   . Alcohol use No    Review of Systems Constitutional: No fever/chills Eyes: No visual changes. ENT: No sore throat. No stiff neck no neck pain Cardiovascular: Denies chest pain. Respiratory: Denies shortness of breath. Gastrointestinal:   no vomiting.  See history of present illness regarding diarrhea.  No constipation. Genitourinary: Negative for dysuria. Musculoskeletal: Negative lower extremity swelling Skin: Negative for rash. Neurological: Negative for severe headaches, focal weakness or numbness. 10-point ROS otherwise negative.  ____________________________________________   PHYSICAL EXAM:  VITAL SIGNS: ED Triage Vitals  Enc Vitals Group     BP 01/30/17 0957 (!) 121/47     Pulse Rate 01/30/17 0957 (!) 105     Resp 01/30/17 0957 16     Temp 01/30/17 0957 97.7 F (36.5 C)     Temp src --      SpO2 01/30/17 0957 99 %     Weight 01/30/17 0958 138 lb (62.6 kg)     Height 01/30/17 0958 5' 4"  (1.626 m)     Head Circumference --      Peak Flow --      Pain Score --      Pain Loc --      Pain Edu? --      Excl. in [place]? --     Constitutional: Alert and oriented. Well appearing and in no acute distress. Eyes: Conjunctivae are normal. PERRL. EOMI. Head: Atraumatic. Nose: No congestion/rhinnorhea. Mouth/Throat: Mucous membranes are moist.  Oropharynx non-erythematous. Neck: No stridor.   Nontender  with no meningismus Cardiovascular: Normal rate, regular rhythm. Grossly normal heart sounds.  Good peripheral circulation. Respiratory: Normal respiratory effort.  No retractions. Lungs CTAB. Abdominal: Soft and nontender. No distention. No guarding no rebound Back:  There is no focal tenderness or step off.  there is no midline tenderness there are no lesions noted. there is no CVA tenderness Rectal exam: No stool in the vault no obvious lesions, guaiac positive secretions are noted Musculoskeletal: No lower extremity tenderness, no upper extremity tenderness. No joint  effusions, no DVT signs strong distal pulses no edema Neurologic:  Normal speech and language. No gross focal neurologic deficits are appreciated.  Skin:  Skin is warm, dry and intact. No rash noted. Psychiatric: Mood and affect are normal. Speech and behavior are normal.  ____________________________________________   LABS (all labs ordered are listed, but only abnormal results are displayed)  Labs Reviewed  COMPREHENSIVE METABOLIC PANEL - Abnormal; Notable for the following:       Result Value   Sodium 130 (*)    Potassium 3.3 (*)    CO2 20 (*)    Glucose, Bld 122 (*)    BUN 24 (*)    Creatinine, Ser 1.14 (*)    Calcium 8.4 (*)    Total Protein 9.1 (*)    Albumin 3.1 (*)    GFR calc non Af Amer 48 (*)    GFR calc Af Amer 55 (*)    All other components within normal limits  CBC - Abnormal; Notable for the following:    WBC 13.7 (*)    RBC 3.10 (*)    Hemoglobin 10.4 (*)    HCT 29.7 (*)    RDW 16.5 (*)    All other components within normal limits  PROTIME-INR  APTT  POC OCCULT BLOOD, ED  TYPE AND SCREEN   ____________________________________________  EKG  I personally interpreted any EKGs ordered by me or triage  ____________________________________________  RADIOLOGY  I reviewed any imaging ordered by me or triage that were performed during my shift and, if possible, patient and/or family made aware of any abnormal findings. ____________________________________________   PROCEDURES  Procedure(s) performed: None  Procedures  Critical Care performed: None  ____________________________________________   INITIAL IMPRESSION / ASSESSMENT AND PLAN / ED COURSE  Pertinent labs & imaging results that were available during my care of the patient were reviewed by me and considered in my medical decision making (see chart for details). patient is on anticoagulation with Lovenox however it has been almost 24 hours since her last dose and therefore she should be  nearly back to baseline. Nonetheless, it is desired that she be anticoagulated and we do need to figure out how much bleeding she has. given her her report of 5 different bloody stools today we will admit her for observation.    ____________________________________________   FINAL CLINICAL IMPRESSION(S) / ED DIAGNOSES  Final diagnoses:  None      This chart was dictated using voice recognition software.  Despite best efforts to proofread,  errors can occur which can change meaning.      Schuyler Amor, MD 01/30/17 [id number]

## 2017-01-31 DIAGNOSIS — C9000 Multiple myeloma not having achieved remission: Secondary | ICD-10-CM

## 2017-01-31 DIAGNOSIS — R109 Unspecified abdominal pain: Secondary | ICD-10-CM | POA: Diagnosis not present

## 2017-01-31 DIAGNOSIS — E7800 Pure hypercholesterolemia, unspecified: Secondary | ICD-10-CM

## 2017-01-31 DIAGNOSIS — Z79899 Other long term (current) drug therapy: Secondary | ICD-10-CM

## 2017-01-31 DIAGNOSIS — R197 Diarrhea, unspecified: Secondary | ICD-10-CM | POA: Diagnosis not present

## 2017-01-31 DIAGNOSIS — I10 Essential (primary) hypertension: Secondary | ICD-10-CM

## 2017-01-31 DIAGNOSIS — D62 Acute posthemorrhagic anemia: Secondary | ICD-10-CM | POA: Diagnosis not present

## 2017-01-31 DIAGNOSIS — K529 Noninfective gastroenteritis and colitis, unspecified: Secondary | ICD-10-CM

## 2017-01-31 DIAGNOSIS — K922 Gastrointestinal hemorrhage, unspecified: Secondary | ICD-10-CM | POA: Diagnosis not present

## 2017-01-31 LAB — C DIFFICILE QUICK SCREEN W PCR REFLEX
C DIFFICILE (CDIFF) TOXIN: NEGATIVE
C DIFFICLE (CDIFF) ANTIGEN: NEGATIVE
C Diff interpretation: NOT DETECTED

## 2017-01-31 LAB — GASTROINTESTINAL PANEL BY PCR, STOOL (REPLACES STOOL CULTURE)
ADENOVIRUS F40/41: NOT DETECTED
ASTROVIRUS: NOT DETECTED
CYCLOSPORA CAYETANENSIS: NOT DETECTED
Campylobacter species: NOT DETECTED
Cryptosporidium: NOT DETECTED
ENTAMOEBA HISTOLYTICA: NOT DETECTED
ENTEROAGGREGATIVE E COLI (EAEC): NOT DETECTED
ENTEROPATHOGENIC E COLI (EPEC): NOT DETECTED
ENTEROTOXIGENIC E COLI (ETEC): NOT DETECTED
GIARDIA LAMBLIA: NOT DETECTED
NOROVIRUS GI/GII: NOT DETECTED
Plesimonas shigelloides: NOT DETECTED
Rotavirus A: NOT DETECTED
SHIGA LIKE TOXIN PRODUCING E COLI (STEC): NOT DETECTED
Salmonella species: NOT DETECTED
Sapovirus (I, II, IV, and V): NOT DETECTED
Shigella/Enteroinvasive E coli (EIEC): NOT DETECTED
VIBRIO CHOLERAE: NOT DETECTED
VIBRIO SPECIES: NOT DETECTED
Yersinia enterocolitica: NOT DETECTED

## 2017-01-31 LAB — COMPREHENSIVE METABOLIC PANEL
ALBUMIN: 2.5 g/dL — AB (ref 3.5–5.0)
ALT: 17 U/L (ref 14–54)
ANION GAP: 3 — AB (ref 5–15)
AST: 19 U/L (ref 15–41)
Alkaline Phosphatase: 55 U/L (ref 38–126)
BUN: 14 mg/dL (ref 6–20)
CALCIUM: 7.8 mg/dL — AB (ref 8.9–10.3)
CHLORIDE: 108 mmol/L (ref 101–111)
CO2: 22 mmol/L (ref 22–32)
Creatinine, Ser: 1.13 mg/dL — ABNORMAL HIGH (ref 0.44–1.00)
GFR calc non Af Amer: 48 mL/min — ABNORMAL LOW (ref >60)
GFR, EST AFRICAN AMERICAN: 56 mL/min — AB (ref >60)
GLUCOSE: 113 mg/dL — AB (ref 65–99)
POTASSIUM: 4.2 mmol/L (ref 3.5–5.1)
SODIUM: 133 mmol/L — AB (ref 135–145)
Total Bilirubin: 0.4 mg/dL (ref 0.3–1.2)
Total Protein: 7.2 g/dL (ref 6.5–8.1)

## 2017-01-31 LAB — CBC
HEMATOCRIT: 28.2 % — AB (ref 35.0–47.0)
HEMOGLOBIN: 9.6 g/dL — AB (ref 12.0–16.0)
MCH: 33.1 pg (ref 26.0–34.0)
MCHC: 33.9 g/dL (ref 32.0–36.0)
MCV: 97.6 fL (ref 80.0–100.0)
Platelets: 139 10*3/uL — ABNORMAL LOW (ref 150–440)
RBC: 2.89 MIL/uL — AB (ref 3.80–5.20)
RDW: 17 % — ABNORMAL HIGH (ref 11.5–14.5)
WBC: 9.9 10*3/uL (ref 3.6–11.0)

## 2017-01-31 NOTE — Consult Note (Signed)
Sherry Lame, MD [address] [place], [place] [id number] Phone: [phone] Fax : [phone]  Consultation  Referring Provider:     No ref. provider found Primary Care Physician:  Lavera Guise, MD Primary Gastroenterologist:  Dr. Tiffany Kocher         Reason for Consultation:   Bloody diarrhea  Date of Admission:  01/30/2017 Date of Consultation:  01/31/2017         HPI:   Sherry Oneal is a 71 y.o. female multiple myeloma on Revlimid-dexamethasone and Velcade presented with 1 day of acute bloody diarrhea a/w abdominal cramps. She reports having normal formed brown BMs prior to this presentation. In ER, CT showed sigmoid and descending colon wall thickening. She is empirically started on cipro and flagyl. She reports that her diarrhea slowed but still having bleeding per rectum and abdominal cramps. She denies f/c/n/v. Tolerating MM treatment very well so far. She denies smoking/ETOH. No bloody diarrhea in the past.   She had EGD in 11/2013 , H Pylori gastritis, s/p confirmed eradication by stool test per pt Colonoscopy in 09/2011 showed adenomas  She denies fam h/o GI malignancies  Past Medical History:  Diagnosis Date  . Anemia   . High blood pressure   . High cholesterol   . Multiple myeloma ([place])   . Renal insufficiency     Past Surgical History:  Procedure Laterality Date  . EXPLORATORY LAPAROTOMY      Prior to Admission medications   Medication Sig Start Date End Date Taking? Authorizing Provider  acetaminophen (TYLENOL) 500 MG tablet Take 500 mg by mouth every 4 (four) hours.   Yes Historical Provider, MD  albuterol (PROVENTIL HFA;VENTOLIN HFA) 108 (90 Base) MCG/ACT inhaler Inhale 2 puffs into the lungs every 6 (six) hours as needed for wheezing or shortness of breath. 11/06/16  Yes Nance Pear, MD  ALPRAZolam Duanne Moron) 0.25 MG tablet Take 0.25 mg by mouth daily as needed.   Yes Historical Provider, MD  atorvastatin (LIPITOR) 10 MG tablet Take 10 mg by  mouth daily. 10/05/16  Yes Historical Provider, MD  Bortezomib (VELCADE IJ) Inject 1 Dose as directed once.   Yes Historical Provider, MD  Calcium Carbonate-Vitamin D 600-400 MG-UNIT tablet Take 1 tablet by mouth daily.   Yes Historical Provider, MD  Cranberry 500 MG CAPS Take 500 mg by mouth every morning.   Yes Historical Provider, MD  DEXAMETHASONE PO Take 1 Dose by mouth once.   Yes Historical Provider, MD  diphenhydrAMINE (BENADRYL ALLERGY) 25 MG tablet Take 25 mg by mouth daily as needed.   Yes Historical Provider, MD  enoxaparin (LOVENOX) 40 MG/0.4ML injection Inject 40 mg into the skin daily. 01/19/17  Yes Historical Provider, MD  lisinopril (PRINIVIL,ZESTRIL) 10 MG tablet Take 10 mg by mouth daily.   Yes Historical Provider, MD  Melatonin 3 MG TABS Take 3 mg by mouth at bedtime as needed.   Yes Historical Provider, MD  Multiple Vitamins-Minerals (PRESERVISION AREDS PO) Take 1 tablet by mouth every morning.   Yes Historical Provider, MD  NON FORMULARY Take 1 Dose by mouth as needed for cough. arotussin   Yes Historical Provider, MD  omeprazole (PRILOSEC) 20 MG capsule Take 20 mg by mouth daily. 01/20/17  Yes Historical Provider, MD  polyethylene glycol (MIRALAX / GLYCOLAX) packet Take 17 g by mouth daily as needed.   Yes Historical Provider, MD  prochlorperazine (COMPAZINE) 5 MG tablet Take 5 mg by mouth every 8 (eight) hours as  needed. 12/04/16  Yes Historical Provider, MD  REVLIMID 15 MG capsule Take 15 mg by mouth See admin instructions. tk 1 cap qd for 14 days, then rest for 7 days 01/27/17  Yes Historical Provider, MD  triamcinolone cream (KENALOG) 0.1 % Apply 1 application topically 2 (two) times daily. 12/31/16 12/31/17 Yes Historical Provider, MD  valACYclovir (VALTREX) 500 MG tablet Take 500 mg by mouth daily. 01/20/17  Yes Historical Provider, MD    Family History  Problem Relation Age of Onset  . Breast cancer Maternal Grandmother 70     Social History  Substance Use Topics  .  Smoking status: Never Smoker  . Smokeless tobacco: Never Used  . Alcohol use No    Allergies as of 01/30/2017 - Review Complete 01/30/2017  Allergen Reaction Noted  . Atenolol  11/06/2016  . Doxycycline  11/06/2016  . Epinephrine  11/06/2016  . Ivp dye [iodinated diagnostic agents] Hypertension 01/30/2017  . Nsaids  11/06/2016  . Penicillins  11/06/2016  . Sulfa antibiotics  11/06/2016  . Tramadol  11/06/2016    Review of Systems:    All systems reviewed and negative except where noted in HPI.   Physical Exam:  Vital signs in last 24 hours: Temp:  [97.5 F (36.4 C)-98.6 F (37 C)] 98.3 F (36.8 C) (02/04 1028) Pulse Rate:  [93-101] 99 (02/04 1028) Resp:  [18-20] 18 (02/04 1028) BP: (108-133)/(43-51) 128/50 (02/04 1028) SpO2:  [94 %-96 %] 94 % (02/04 1028) Weight:  [66.4 kg (146 lb 4.8 oz)] 66.4 kg (146 lb 4.8 oz) (02/04 0500) Last BM Date: 01/31/17 General:   Pleasant, cooperative in NAD Head:  Normocephalic and atraumatic. Eyes:   No icterus.   Conjunctiva pink. PERRLA. Ears:  Normal auditory acuity. Neck:  Supple; no masses or thyroidomegaly Lungs: Respirations even and unlabored. Lungs clear to auscultation bilaterally.   No wheezes, crackles, or rhonchi.  Heart:  Regular rate and rhythm;  Without murmur, clicks, rubs or gallops Abdomen:  Soft, nondistended, moderately tender in LLQ. Normal bowel sounds. No appreciable masses or hepatomegaly.  No rebound or guarding.  Rectal:  Not performed. Msk:  Symmetrical without gross deformities.  Strength Extremities:  Without edema, cyanosis or clubbing. Neurologic:  Alert and oriented x3;  grossly normal neurologically. Skin:  Intact without significant lesions or rashes. Cervical Nodes:  No significant cervical adenopathy. Psych:  Alert and cooperative. Normal affect.  LAB RESULTS:  Recent Labs  01/30/17 1027 01/30/17 1734 01/31/17 0413  WBC 13.7*  --  9.9  HGB 10.4* 9.4* 9.6*  HCT 29.7*  --  28.2*  PLT 183  --   139*   BMET  Recent Labs  01/30/17 1027 01/31/17 0413  NA 130* 133*  K 3.3* 4.2  CL 104 108  CO2 20* 22  GLUCOSE 122* 113*  BUN 24* 14  CREATININE 1.14* 1.13*  CALCIUM 8.4* 7.8*   LFT  Recent Labs  01/31/17 0413  PROT 7.2  ALBUMIN 2.5*  AST 19  ALT 17  ALKPHOS 55  BILITOT 0.4   PT/INR  Recent Labs  01/30/17 1027  LABPROT 13.4  INR 1.02    STUDIES: Ct Abdomen Pelvis Wo Contrast  Result Date: 01/30/2017 CLINICAL DATA:  Hematochezia. EXAM: CT ABDOMEN AND PELVIS WITHOUT CONTRAST TECHNIQUE: Multidetector CT imaging of the abdomen and pelvis was performed following the standard protocol without IV contrast. COMPARISON:  None. FINDINGS: Lower chest: No acute abnormality. Hepatobiliary: Unenhanced appearance of the liver and gallbladder are unremarkable. Pancreas: Unremarkable  unenhanced appearance. Spleen: Unremarkable. Adrenals/Urinary Tract: The left kidney is atrophic relative to the right. Both kidneys demonstrate areas of dystrophic calcification involving cortex with cortical scarring. No hydronephrosis. Stomach/Bowel: No evidence of bowel obstruction. Oral contrast was administered. Without IV contrast, it is difficult to evaluate enhancement of the bowel. However, based on appearance of the colon, there is some suspicion of potential colitis involving the descending and sigmoid colon. No evidence of significant colonic diverticular disease, acute diverticulitis, free air or abscess. Vascular/Lymphatic: No enlarged lymph nodes are identified. The abdominal aorta shows normal caliber. Reproductive: Uterus and bilateral adnexa are unremarkable. Other: No abdominal wall hernia or abnormality. No abdominopelvic ascites. Musculoskeletal: No acute or significant osseous findings. IMPRESSION: 1. Suspect colitis involving the descending and sigmoid colon. No evidence of bowel perforation, obstruction or diverticular disease. 2. Scarring of both kidneys with dystrophic calcification  involving bilateral renal cortex and relative atrophy of the left kidney compared to the right. Electronically Signed   By: Aletta Edouard M.D.   On: 01/30/2017 15:07      Impression / Plan:   Sherry One[place] is a 71 y.o. y/o female with multiple myeloma on treatment presented with 1 day h/o bloody diarrhea and abdominal pain, CT with left sided colitis. DDs include ischemic colitis or infectious colitis including C Diff/CMV colitis or IBD less likely   - Continue cipro/flagyl - Continue full liquid diet only - Enteric cultures - Check stool for C diff - If stool studies negative and bleeding/diarrhea persists, recommend colonoscopy   Thank you for involving me in the care of this patient.      LOS: 0 days   Sherri Sear, MD  01/31/2017, 5:00 PM

## 2017-01-31 NOTE — Progress Notes (Signed)
[facility] NOTE  Patient Care Team: Lavera Guise, MD as PCP - General (Internal Medicine)  CHIEF COMPLAINTS/PURPOSE OF CONSULTATION:  Multiple myeloma  HISTORY OF PRESENTING ILLNESS:  [place] 71 y.o.  female with a history of IgG kappa multiple myeloma- currently on Revlimid-dexamethasone and Velcade [Follows with Dr.Tuchman; [facility]]- with a good response was also recently evaluated by bone marrow transplant team at [facility]. Patient was diagnosed with multiple myeloma in October 2017  Patient noted to have loose stool; started yesterday "multiple episodes "- and was associated with bright red blood. Patient was seen in the emergency room had a CT scan -noncontrast- that shows suspicious findings of colitis in the descending and sigmoid colon. Patient is currently on IV Flagyl and Cipro. Denies any diarrhea however had about 3 episodes of bright red blood per rectum since this morning. Continues to have intermittent abdominal cramps along with nausea no vomiting. Patient had been on prophylactic Lovenox; this is currently on hold. Patient's Revlimid- is also on hold at this time  Otherwise no skin rash. No chest pain. No shortness of breath. No cough.  ROS: A complete 10 point review of system is done which is negative except mentioned above in history of present illness  MEDICAL HISTORY:  Past Medical History:  Diagnosis Date  . Anemia   . High blood pressure   . High cholesterol   . Multiple myeloma ([place])   . Renal insufficiency     SURGICAL HISTORY: Past Surgical History:  Procedure Laterality Date  . EXPLORATORY LAPAROTOMY      SOCIAL HISTORY: Social History   Social History  . Marital status: Married    Spouse name: N/A  . Number of children: N/A  . Years of education: N/A   Occupational History  . Not on file.   Social History Main Topics  . Smoking status: Never Smoker  . Smokeless tobacco: Never Used  . Alcohol use No  . Drug use:  No  . Sexual activity: Not on file   Other Topics Concern  . Not on file   Social History Narrative  . No narrative on file    FAMILY HISTORY: Family History  Problem Relation Age of Onset  . Breast cancer Maternal Grandmother 51    ALLERGIES:  is allergic to atenolol; doxycycline; epinephrine; ivp dye [iodinated diagnostic agents]; nsaids; penicillins; sulfa antibiotics; and tramadol.  MEDICATIONS:  Current Facility-Administered Medications  Medication Dose Route Frequency Provider Last Rate Last Dose  . 0.9 % NaCl with KCl 20 mEq/ L  infusion   Intravenous Continuous Idelle Crouch, MD 100 mL/hr at 01/30/17 2248    . acetaminophen (TYLENOL) tablet 650 mg  650 mg Oral Q6H PRN Idelle Crouch, MD   650 mg at 01/31/17 0762   Or  . acetaminophen (TYLENOL) suppository 650 mg  650 mg Rectal Q6H PRN Idelle Crouch, MD      . albuterol (PROVENTIL) (2.5 MG/3ML) 0.083% nebulizer solution 3 mL  3 mL Inhalation Q6H PRN Idelle Crouch, MD      . ALPRAZolam Duanne Moron) tablet 0.25 mg  0.25 mg Oral TID PRN Idelle Crouch, MD   0.25 mg at 01/30/17 1919  . bisacodyl (DULCOLAX) suppository 10 mg  10 mg Rectal Daily PRN Idelle Crouch, MD      . ciprofloxacin (CIPRO) IVPB 400 mg  400 mg Intravenous Q12H Idelle Crouch, MD 200 mL/hr at 01/31/17 1135 400 mg at 01/31/17  1135  . docusate sodium (COLACE) capsule 100 mg  100 mg Oral BID Idelle Crouch, MD      . lisinopril (PRINIVIL,ZESTRIL) tablet 10 mg  10 mg Oral Daily Idelle Crouch, MD   10 mg at 01/31/17 1029  . metroNIDAZOLE (FLAGYL) IVPB 500 mg  500 mg Intravenous Q8H Idelle Crouch, MD 100 mL/hr at 01/31/17 1135 500 mg at 01/31/17 1135  . ondansetron (ZOFRAN) tablet 4 mg  4 mg Oral Q6H PRN Idelle Crouch, MD   4 mg at 01/30/17 2356   Or  . ondansetron [facility]) injection 4 mg  4 mg Intravenous Q6H PRN Idelle Crouch, MD   4 mg at 01/30/17 1436  . oxyCODONE (Oxy IR/ROXICODONE) immediate release tablet 5 mg  5 mg Oral Once  Loletha Grayer, MD      . pantoprazole (PROTONIX) EC tablet 40 mg  40 mg Oral Daily Idelle Crouch, MD   40 mg at 01/31/17 1029  . valACYclovir (VALTREX) tablet 500 mg  500 mg Oral Daily Idelle Crouch, MD   500 mg at 01/31/17 1029      .  PHYSICAL EXAMINATION:  Vitals:   01/31/17 0415 01/31/17 1028  BP: (!) 108/43 (!) 128/50  Pulse: 93 99  Resp: 20 18  Temp: 98.6 F (37 C) 98.3 F (36.8 C)   Filed Weights   01/30/17 0958 01/30/17 1512 01/31/17 0500  Weight: 138 lb (62.6 kg) 141 lb 8 oz (64.2 kg) 146 lb 4.8 oz (66.4 kg)    GENERAL: Well-nourished well-developed; Alert, no distress and comfortable.   Alone.  EYES: no pallor or icterus OROPHARYNX: no thrush or ulceration. NECK: supple, no masses felt LYMPH:  no palpable lymphadenopathy in the cervical, axillary or inguinal regions LUNGS: decreased breath sounds to auscultation at bases and  No wheeze or crackles HEART/CVS: regular rate & rhythm and no murmurs; No lower extremity edema ABDOMEN: abdomen soft, mild tenderness on deep palpation and normal bowel sounds Musculoskeletal:no cyanosis of digits and no clubbing  PSYCH: alert & oriented x 3 with fluent speech NEURO: no focal motor/sensory deficits SKIN:  no rashes or significant lesions  LABORATORY DATA:  I have reviewed the data as listed Lab Results  Component Value Date   WBC 9.9 01/31/2017   HGB 9.6 (L) 01/31/2017   HCT 28.2 (L) 01/31/2017   MCV 97.6 01/31/2017   PLT 139 (L) 01/31/2017    Recent Labs  11/06/16 0820 01/30/17 1027 01/31/17 0413  NA 132* 130* 133*  K 3.9 3.3* 4.2  CL 104 104 108  CO2 23 20* 22  GLUCOSE 109* 122* 113*  BUN 11 24* 14  CREATININE 1.20* 1.14* 1.13*  CALCIUM 9.8 8.4* 7.8*  GFRNONAA 45* 48* 48*  GFRAA 52* 55* 56*  PROT 11.1* 9.1* 7.2  ALBUMIN 3.3* 3.1* 2.5*  AST 31 26 19   ALT 17 22 17   ALKPHOS 56 64 55  BILITOT 0.3 0.5 0.4    RADIOGRAPHIC STUDIES: I have personally reviewed the radiological images as listed  and agreed with the findings in the report. Ct Abdomen Pelvis Wo Contrast  Result Date: 01/30/2017 CLINICAL DATA:  Hematochezia. EXAM: CT ABDOMEN AND PELVIS WITHOUT CONTRAST TECHNIQUE: Multidetector CT imaging of the abdomen and pelvis was performed following the standard protocol without IV contrast. COMPARISON:  None. FINDINGS: Lower chest: No acute abnormality. Hepatobiliary: Unenhanced appearance of the liver and gallbladder are unremarkable. Pancreas: Unremarkable unenhanced appearance. Spleen: Unremarkable. Adrenals/Urinary Tract: The left kidney  is atrophic relative to the right. Both kidneys demonstrate areas of dystrophic calcification involving cortex with cortical scarring. No hydronephrosis. Stomach/Bowel: No evidence of bowel obstruction. Oral contrast was administered. Without IV contrast, it is difficult to evaluate enhancement of the bowel. However, based on appearance of the colon, there is some suspicion of potential colitis involving the descending and sigmoid colon. No evidence of significant colonic diverticular disease, acute diverticulitis, free air or abscess. Vascular/Lymphatic: No enlarged lymph nodes are identified. The abdominal aorta shows normal caliber. Reproductive: Uterus and bilateral adnexa are unremarkable. Other: No abdominal wall hernia or abnormality. No abdominopelvic ascites. Musculoskeletal: No acute or significant osseous findings. IMPRESSION: 1. Suspect colitis involving the descending and sigmoid colon. No evidence of bowel perforation, obstruction or diverticular disease. 2. Scarring of both kidneys with dystrophic calcification involving bilateral renal cortex and relative atrophy of the left kidney compared to the right. Electronically Signed   By: Aletta Edouard M.D.   On: 01/30/2017 15:07   Mm Digital Screening Bilateral  Result Date: 01/25/2017 CLINICAL DATA:  Screening. EXAM: DIGITAL SCREENING BILATERAL MAMMOGRAM WITH CAD COMPARISON:  Previous exam(s). ACR  Breast Density Category c: The breast tissue is heterogeneously dense, which may obscure small masses. FINDINGS: There are no findings suspicious for malignancy. Images were processed with CAD. IMPRESSION: No mammographic evidence of malignancy. A result letter of this screening mammogram will be mailed directly to the patient. RECOMMENDATION: Screening mammogram in one year. (Code:SM-B-01Y) BI-RADS CATEGORY  1: Negative. Electronically Signed   By: Ammie Ferrier M.D.   On: 01/25/2017 11:33    ASSESSMENT & PLAN:  # 71 year old female patient with multiple myeloma currently on Revlimid and dexamethasone and Velcade is currently admitted to hospital for bloody diarrhea.  # Multiple myeloma- currently on induction treatment with Revlimid and dexamethasone and Velcade. It's unlikely any of this treatments should cause bloody diarrhea/colitis. Patient has 3 more days of Revlimid; recommend holding at this time. Also recommend holding prophylactic Lovenox- while bleeding. I will reach out to [place] for an update.   # Colitis/bloody diarrhea- etiology unclear. On ciprofloxacin and Flagyl IV Diarrhea improved however continues to have red blood per rectum. Awaiting GI evaluation. Monitor H&H closely.   # Thank you Dr. Anselm Jungling for allowing me to participate in the care of your pleasant patient. Please do not hesitate to contact me with questions or concerns in the interim.  All questions were answered.     Cammie Sickle, MD 01/31/2017 12:00 PM

## 2017-01-31 NOTE — Plan of Care (Signed)
Problem: Physical Regulation: Goal: Ability to maintain clinical measurements within normal limits will improve Outcome: Progressing Hqb stable at 9.4 Goal: Will remain free from infection Outcome: Progressing Pt receiving IV antibiotics  Problem: Activity: Goal: Risk for activity intolerance will decrease Outcome: Progressing Pt up to BR as needed with no difficulty.  Problem: Nutrition: Goal: Adequate nutrition will be maintained Outcome: Progressing Pt upgraded to full liquid diet today.  Problem: Bowel/Gastric: Goal: Will not experience complications related to bowel motility Outcome: Not Progressing Pt continues to have several bloody bowel movements today  Problem: Bowel/Gastric: Goal: Will show no signs and symptoms of gastrointestinal bleeding Outcome: Not Progressing Pt has had several episodes of GI bleeding today.  Problem: Fluid Volume: Goal: Will show no signs and symptoms of excessive bleeding Outcome: Progressing Pt's hqb and BP are stable at this time.

## 2017-01-31 NOTE — Progress Notes (Signed)
[employer] [facility] [place] [facility]   PATIENT NAME: Sherry Oneal    MR#:  [id number]  DATE OF BIRTH:  12-19-1946  SUBJECTIVE:  CHIEF COMPLAINT:   Chief Complaint  Patient presents with  . Diarrhea  . Abdominal Pain   Multiple myeloma, on chemo- last session was 2 days ago, came with lower abdominal cramping pain , multiple episodes of bloody diarrhea, no vomits for 1 day. Hb slightly lower tahn baseline, but stable. She is tolerating clear liquids diet and had 5-6 episodes of loose stool, but little blood since admission. Still have cramping pain in lower part of abdomen. REVIEW OF SYSTEMS:  CONSTITUTIONAL: No fever, fatigue or weakness.  EYES: No blurred or double vision.  EARS, NOSE, AND THROAT: No tinnitus or ear pain.  RESPIRATORY: No cough, shortness of breath, wheezing or hemoptysis.  CARDIOVASCULAR: No chest pain, orthopnea, edema.  GASTROINTESTINAL: No nausea, vomiting, positive for diarrhea or abdominal pain.  GENITOURINARY: No dysuria, hematuria.  ENDOCRINE: No polyuria, nocturia,  HEMATOLOGY: No anemia, easy bruising or bleeding SKIN: No rash or lesion. MUSCULOSKELETAL: No joint pain or arthritis.   NEUROLOGIC: No tingling, numbness, weakness.  PSYCHIATRY: No anxiety or depression.   ROS  DRUG ALLERGIES:   Allergies  Allergen Reactions  . Atenolol     "wierd feeling"  . Doxycycline     Upset stomach   . Epinephrine     "Shaking"  . Ivp Dye [Iodinated Diagnostic Agents] Hypertension    Per tech conversation with PT, pt states that the last time she had IV dye she had increased BP and tachycardia.     . Nsaids     Bleeding  . Penicillins     rash  . Sulfa Antibiotics     Rash   . Tramadol     Numb feeling     VITALS:  Blood pressure (!) 108/43, pulse 93, temperature 98.6 F (37 C), temperature source Oral, resp. rate 20, height 5\' 4"  (1.626 m), weight 66.4 kg (146 lb 4.8 oz), SpO2 96 %.  PHYSICAL EXAMINATION:  GENERAL:  71  y.o.-year-old patient lying in the bed with no acute distress.  EYES: Pupils equal, round, reactive to light and accommodation. No scleral icterus. Extraocular muscles intact.  HEENT: Head atraumatic, normocephalic. Oropharynx and nasopharynx clear.  NECK:  Supple, no jugular venous distention. No thyroid enlargement, no tenderness.  LUNGS: Normal breath sounds bilaterally, no wheezing, rales,rhonchi or crepitation. No use of accessory muscles of respiration.  CARDIOVASCULAR: S1, S2 normal. No murmurs, rubs, or gallops.  ABDOMEN: Soft, mild lower abdomen tender, nondistended. Bowel sounds present. No organomegaly or mass.  EXTREMITIES: No pedal edema, cyanosis, or clubbing.  NEUROLOGIC: Cranial nerves II through XII are intact. Muscle strength 5/5 in all extremities. Sensation intact. Gait not checked.  PSYCHIATRIC: The patient is alert and oriented x 3.  SKIN: No obvious rash, lesion, or ulcer.   Physical Exam LABORATORY PANEL:   CBC  Recent Labs Lab 01/31/17 0413  WBC 9.9  HGB 9.6*  HCT 28.2*  PLT 139*   ------------------------------------------------------------------------------------------------------------------  Chemistries   Recent Labs Lab 01/31/17 0413  NA 133*  K 4.2  CL 108  CO2 22  GLUCOSE 113*  BUN 14  CREATININE 1.13*  CALCIUM 7.8*  AST 19  ALT 17  ALKPHOS 55  BILITOT 0.4   ------------------------------------------------------------------------------------------------------------------  Cardiac Enzymes No results for input(s): TROPONINI in the last 168 hours. ------------------------------------------------------------------------------------------------------------------  RADIOLOGY:  Ct Abdomen Pelvis Wo Contrast  Result  Date: 01/30/2017 CLINICAL DATA:  Hematochezia. EXAM: CT ABDOMEN AND PELVIS WITHOUT CONTRAST TECHNIQUE: Multidetector CT imaging of the abdomen and pelvis was performed following the standard protocol without IV contrast.  COMPARISON:  None. FINDINGS: Lower chest: No acute abnormality. Hepatobiliary: Unenhanced appearance of the liver and gallbladder are unremarkable. Pancreas: Unremarkable unenhanced appearance. Spleen: Unremarkable. Adrenals/Urinary Tract: The left kidney is atrophic relative to the right. Both kidneys demonstrate areas of dystrophic calcification involving cortex with cortical scarring. No hydronephrosis. Stomach/Bowel: No evidence of bowel obstruction. Oral contrast was administered. Without IV contrast, it is difficult to evaluate enhancement of the bowel. However, based on appearance of the colon, there is some suspicion of potential colitis involving the descending and sigmoid colon. No evidence of significant colonic diverticular disease, acute diverticulitis, free air or abscess. Vascular/Lymphatic: No enlarged lymph nodes are identified. The abdominal aorta shows normal caliber. Reproductive: Uterus and bilateral adnexa are unremarkable. Other: No abdominal wall hernia or abnormality. No abdominopelvic ascites. Musculoskeletal: No acute or significant osseous findings. IMPRESSION: 1. Suspect colitis involving the descending and sigmoid colon. No evidence of bowel perforation, obstruction or diverticular disease. 2. Scarring of both kidneys with dystrophic calcification involving bilateral renal cortex and relative atrophy of the left kidney compared to the right. Electronically Signed   By: Aletta Edouard M.D.   On: 01/30/2017 15:07    ASSESSMENT AND PLAN:   Principal Problem:   LGI bleed Active Problems:   Acute blood loss anemia   Abdominal pain   Multiple myeloma (HCC)   * Lower GI bleed   Acute colitis    IV cipro+ Flagyl.   Liquid diet.   GI consult   Hb stable. No need for transfusion this time.  * Multiple myeloma   On chemo at [facility] consult.  * Hypertension   Lisinopril.  * Anxiety   Alprazolam  * Chronic anemia   Due to MM.   Stable, monitor.   All the  records are reviewed and case discussed with Care Management/Social Workerr. Management plans discussed with the patient, family and they are in agreement.  CODE STATUS: Full.  TOTAL TIME TAKING CARE OF THIS PATIENT: 35 minutes.    POSSIBLE D/C IN 1-2 DAYS, DEPENDING ON CLINICAL CONDITION.   Vaughan Basta M.D on 01/31/2017   Between 7am to 6pm - Pager - [phone]  After 6pm go to [email or web address] - password [email or web address] [facility]  Office  [phone]  CC: Primary care physician; Lavera Guise, MD  Note: This dictation was prepared with Dragon dictation along with smaller phrase technology. Any transcriptional errors that result from this process are unintentional.

## 2017-02-01 DIAGNOSIS — R109 Unspecified abdominal pain: Secondary | ICD-10-CM | POA: Diagnosis not present

## 2017-02-01 DIAGNOSIS — D62 Acute posthemorrhagic anemia: Secondary | ICD-10-CM | POA: Diagnosis not present

## 2017-02-01 DIAGNOSIS — C9000 Multiple myeloma not having achieved remission: Secondary | ICD-10-CM | POA: Diagnosis not present

## 2017-02-01 DIAGNOSIS — A09 Infectious gastroenteritis and colitis, unspecified: Secondary | ICD-10-CM | POA: Diagnosis not present

## 2017-02-01 DIAGNOSIS — K922 Gastrointestinal hemorrhage, unspecified: Secondary | ICD-10-CM

## 2017-02-01 MED ORDER — METRONIDAZOLE 250 MG PO TABS: 250.0000 mg | ORAL_TABLET | Freq: Three times a day (TID) | ORAL | 0 refills | Status: AC

## 2017-02-01 MED ORDER — CIPROFLOXACIN HCL 500 MG PO TABS: 500.0000 mg | ORAL_TABLET | Freq: Two times a day (BID) | ORAL | 0 refills | Status: AC

## 2017-02-01 NOTE — Discharge Instructions (Signed)
Follow with [facility] cancer center- and follow their advise about starting the cancer related medicines.

## 2017-02-01 NOTE — Progress Notes (Signed)
Jonathon Bellows MD [address], [place], [place] [address] Phone: [phone] Fax : [phone]  Sherry Oneal is being followed for colitis  Day 2 of follow up   Subjective:  " I feel much much better"- no diarrhea, tolerated entire breakfast. Abdominal pain much better.    Objective: Vital signs in last 24 hours: Vitals:   01/31/17 1956 02/01/17 0408 02/01/17 0500 02/01/17 0802  BP: (!) 116/54 (!) 126/51  (!) 137/58  Pulse: 91 83  85  Resp: 20 20    Temp: 97.7 F (36.5 C) 97.5 F (36.4 C)    TempSrc: Oral Oral    SpO2: 96% 96%    Weight:   145 lb (65.8 kg)   Height:       Weight change: 7 lb (3.175 kg)  Intake/Output Summary (Last 24 hours) at 02/01/17 1107 Last data filed at 02/01/17 1004  Gross per 24 hour  Intake              880 ml  Output                0 ml  Net              880 ml     Exam: Heart:: Regular rate and rhythm, S1S2 present or without murmur or extra heart sounds Lungs: normal, clear to auscultation and clear to auscultation and percussion Abdomen: soft, nontender, normal bowel sounds   Lab Results: CBC Latest Ref Rng & Units 01/31/2017 01/30/2017 01/30/2017  WBC 3.6 - 11.0 K/uL 9.9 - 13.7(H)  Hemoglobin 12.0 - 16.0 g/dL 9.6(L) 9.4(L) 10.4(L)  Hematocrit 35.0 - 47.0 % 28.2(L) - 29.7(L)  Platelets 150 - 440 K/uL 139(L) - 183    Hepatic Function Latest Ref Rng & Units 01/31/2017 01/30/2017 11/06/2016  Total Protein 6.5 - 8.1 g/dL 7.2 9.1(H) 11.1(H)  Albumin 3.5 - 5.0 g/dL 2.5(L) 3.1(L) 3.3(L)  AST 15 - 41 U/L 19 26 31   ALT 14 - 54 U/L 17 22 17   Alk Phosphatase 38 - 126 U/L 55 64 56  Total Bilirubin 0.3 - 1.2 mg/dL 0.4 0.5 0.3    BMP Latest Ref Rng & Units 01/31/2017 01/30/2017 11/06/2016  Glucose 65 - 99 mg/dL 113(H) 122(H) 109(H)  BUN 6 - 20 mg/dL 14 24(H) 11  Creatinine 0.44 - 1.00 mg/dL 1.13(H) 1.14(H) 1.20(H)  Sodium 135 - 145 mmol/L 133(L) 130(L) 132(L)  Potassium 3.5 - 5.1 mmol/L 4.2 3.3(L) 3.9  Chloride 101 - 111 mmol/L 108 104 104   CO2 22 - 32 mmol/L 22 20(L) 23  Calcium 8.9 - 10.3 mg/dL 7.8(L) 8.4(L) 9.8    Micro Results: Recent Results (from the past 240 hour(s))  C difficile quick screen w PCR reflex     Status: None   Collection Time: 01/31/17  6:50 PM  Result Value Ref Range Status   C Diff antigen NEGATIVE NEGATIVE Final   C Diff toxin NEGATIVE NEGATIVE Final   C Diff interpretation No C. difficile detected.  Final  Gastrointestinal Panel by PCR , Stool     Status: None   Collection Time: 01/31/17  6:50 PM  Result Value Ref Range Status   Campylobacter species NOT DETECTED NOT DETECTED Final   Plesimonas shigelloides NOT DETECTED NOT DETECTED Final   Salmonella species NOT DETECTED NOT DETECTED Final   Yersinia enterocolitica NOT DETECTED NOT DETECTED Final   Vibrio species NOT DETECTED NOT DETECTED Final   Vibrio cholerae NOT DETECTED NOT DETECTED Final  Enteroaggregative E coli (EAEC) NOT DETECTED NOT DETECTED Final   Enteropathogenic E coli (EPEC) NOT DETECTED NOT DETECTED Final   Enterotoxigenic E coli (ETEC) NOT DETECTED NOT DETECTED Final   Shiga like toxin producing E coli (STEC) NOT DETECTED NOT DETECTED Final   Shigella/Enteroinvasive E coli (EIEC) NOT DETECTED NOT DETECTED Final   Cryptosporidium NOT DETECTED NOT DETECTED Final   Cyclospora cayetanensis NOT DETECTED NOT DETECTED Final   Entamoeba histolytica NOT DETECTED NOT DETECTED Final   Giardia lamblia NOT DETECTED NOT DETECTED Final   Adenovirus F40/41 NOT DETECTED NOT DETECTED Final   Astrovirus NOT DETECTED NOT DETECTED Final   Norovirus GI/GII NOT DETECTED NOT DETECTED Final   Rotavirus A NOT DETECTED NOT DETECTED Final   Sapovirus (I, II, IV, and V) NOT DETECTED NOT DETECTED Final   Studies/Results: Ct Abdomen Pelvis Wo Contrast  Result Date: 01/30/2017 CLINICAL DATA:  Hematochezia. EXAM: CT ABDOMEN AND PELVIS WITHOUT CONTRAST TECHNIQUE: Multidetector CT imaging of the abdomen and pelvis was performed following the standard  protocol without IV contrast. COMPARISON:  None. FINDINGS: Lower chest: No acute abnormality. Hepatobiliary: Unenhanced appearance of the liver and gallbladder are unremarkable. Pancreas: Unremarkable unenhanced appearance. Spleen: Unremarkable. Adrenals/Urinary Tract: The left kidney is atrophic relative to the right. Both kidneys demonstrate areas of dystrophic calcification involving cortex with cortical scarring. No hydronephrosis. Stomach/Bowel: No evidence of bowel obstruction. Oral contrast was administered. Without IV contrast, it is difficult to evaluate enhancement of the bowel. However, based on appearance of the colon, there is some suspicion of potential colitis involving the descending and sigmoid colon. No evidence of significant colonic diverticular disease, acute diverticulitis, free air or abscess. Vascular/Lymphatic: No enlarged lymph nodes are identified. The abdominal aorta shows normal caliber. Reproductive: Uterus and bilateral adnexa are unremarkable. Other: No abdominal wall hernia or abnormality. No abdominopelvic ascites. Musculoskeletal: No acute or significant osseous findings. IMPRESSION: 1. Suspect colitis involving the descending and sigmoid colon. No evidence of bowel perforation, obstruction or diverticular disease. 2. Scarring of both kidneys with dystrophic calcification involving bilateral renal cortex and relative atrophy of the left kidney compared to the right. Electronically Signed   By: Aletta Edouard M.D.   On: 01/30/2017 15:07   Medications: I have reviewed the patient's current medications. Scheduled Meds: . ciprofloxacin  400 mg Intravenous Q12H  . docusate sodium  100 mg Oral BID  . lisinopril  10 mg Oral Daily  . metronidazole  500 mg Intravenous Q8H  . oxyCODONE  5 mg Oral Once  . pantoprazole  40 mg Oral Daily  . valACYclovir  500 mg Oral Daily   Continuous Infusions: . 0.9 % NaCl with KCl 20 mEq / L 100 mL/hr at 01/31/17 2120   PRN  Meds:.acetaminophen **OR** acetaminophen, albuterol, ALPRAZolam, bisacodyl, ondansetron **OR** ondansetron (ZOFRAN) IV   Assessment: Principal Problem:   LGI bleed Active Problems:   Acute blood loss anemia   Abdominal pain   Multiple myeloma (HCC)    Plan: - likely infectious colitis that has resolved. No further rectal bleeding or diarrhea.  - Continue and complete course of antibiotics - If bloody diarrhea recurs then will need evaluation  - I will sign off.  Please call me if any further GI concerns or questions.  We would like to thank you for the opportunity to participate in the care of Sherry Oneal.    LOS: 0 days   Jonathon Bellows 02/01/2017, 11:07 AM

## 2017-02-01 NOTE — Discharge Summary (Signed)
[place] [place] [facility] [place] NAME: Sherry Oneal    MR#:  [id number]  DATE OF BIRTH:  Sep 05, 1946  DATE OF ADMISSION:  01/30/2017 ADMITTING PHYSICIAN: Idelle Crouch, MD  DATE OF DISCHARGE: 02/01/2017  PRIMARY CARE PHYSICIAN: Lavera Guise, MD    ADMISSION DIAGNOSIS:  Abdominal pain [R10.9] Gastrointestinal hemorrhage associated with anorectal source [K62.5]  DISCHARGE DIAGNOSIS:  Principal Problem:   LGI bleed Active Problems:   Acute blood loss anemia   Abdominal pain   Multiple myeloma ([place])   SECONDARY DIAGNOSIS:   Past Medical History:  Diagnosis Date  . Anemia   . High blood pressure   . High cholesterol   . Multiple myeloma ([place])   . Renal insufficiency     HOSPITAL COURSE:   * Lower GI bleed   Acute colitis    IV cipro+ Flagyl.   Liquid diet.   GI consult   Hb stable. No need for transfusion this time.  * Multiple myeloma   On chemo at [facility] consult.  * Hypertension   Lisinopril.  * Anxiety   Alprazolam  * Chronic anemia   Due to MM.   Stable, monitor.   DISCHARGE CONDITIONS:   Stable.  CONSULTS OBTAINED:  Treatment Team:  Cammie Sickle, MD  DRUG ALLERGIES:   Allergies  Allergen Reactions  . Atenolol     "wierd feeling"  . Doxycycline     Upset stomach   . Epinephrine     "Shaking"  . Ivp Dye [Iodinated Diagnostic Agents] Hypertension    Per tech conversation with PT, pt states that the last time she had IV dye she had increased BP and tachycardia.     . Nsaids     Bleeding  . Penicillins     rash  . Sulfa Antibiotics     Rash   . Tramadol     Numb feeling     DISCHARGE MEDICATIONS:   Current Discharge Medication List    START taking these medications   Details  ciprofloxacin (CIPRO) 500 MG tablet Take 1 tablet (500 mg total) by mouth 2 (two) times daily. Qty: 10 tablet, Refills: 0    metroNIDAZOLE (FLAGYL) 250 MG tablet Take 1 tablet (250 mg  total) by mouth 3 (three) times daily. Qty: 15 tablet, Refills: 0      CONTINUE these medications which have NOT CHANGED   Details  acetaminophen (TYLENOL) 500 MG tablet Take 500 mg by mouth every 4 (four) hours.    albuterol (PROVENTIL HFA;VENTOLIN HFA) 108 (90 Base) MCG/ACT inhaler Inhale 2 puffs into the lungs every 6 (six) hours as needed for wheezing or shortness of breath. Qty: 1 Inhaler, Refills: 0    ALPRAZolam (XANAX) 0.25 MG tablet Take 0.25 mg by mouth daily as needed.    atorvastatin (LIPITOR) 10 MG tablet Take 10 mg by mouth daily.    Calcium Carbonate-Vitamin D 600-400 MG-UNIT tablet Take 1 tablet by mouth daily.    Cranberry 500 MG CAPS Take 500 mg by mouth every morning.    diphenhydrAMINE (BENADRYL ALLERGY) 25 MG tablet Take 25 mg by mouth daily as needed.    lisinopril (PRINIVIL,ZESTRIL) 10 MG tablet Take 10 mg by mouth daily.    Melatonin 3 MG TABS Take 3 mg by mouth at bedtime as needed.    Multiple Vitamins-Minerals (PRESERVISION AREDS PO) Take 1 tablet by mouth every morning.    omeprazole (PRILOSEC) 20 MG capsule  Take 20 mg by mouth daily.    polyethylene glycol (MIRALAX / GLYCOLAX) packet Take 17 g by mouth daily as needed.    prochlorperazine (COMPAZINE) 5 MG tablet Take 5 mg by mouth every 8 (eight) hours as needed.    triamcinolone cream (KENALOG) 0.1 % Apply 1 application topically 2 (two) times daily.    valACYclovir (VALTREX) 500 MG tablet Take 500 mg by mouth daily.      STOP taking these medications     Bortezomib (VELCADE IJ)      DEXAMETHASONE PO      enoxaparin (LOVENOX) 40 MG/0.4ML injection      NON FORMULARY      REVLIMID 15 MG capsule          DISCHARGE INSTRUCTIONS:    Follow with Cancer center at [facility].  If you experience worsening of your admission symptoms, develop shortness of breath, life threatening emergency, suicidal or homicidal thoughts you must seek medical attention immediately by calling 911 or calling your  MD immediately  if symptoms less severe.  You Must read complete instructions/literature along with all the possible adverse reactions/side effects for all the Medicines you take and that have been prescribed to you. Take any new Medicines after you have completely understood and accept all the possible adverse reactions/side effects.   Please note  You were cared for by a hospitalist during your hospital stay. If you have any questions about your discharge medications or the care you received while you were in the hospital after you are discharged, you can call the unit and asked to speak with the hospitalist on call if the hospitalist that took care of you is not available. Once you are discharged, your primary care physician will handle any further medical issues. Please note that NO REFILLS for any discharge medications will be authorized once you are discharged, as it is imperative that you return to your primary care physician (or establish a relationship with a primary care physician if you do not have one) for your aftercare needs so that they can reassess your need for medications and monitor your lab values.    Today   CHIEF COMPLAINT:   Chief Complaint  Patient presents with  . Diarrhea  . Abdominal Pain    HISTORY OF PRESENT ILLNESS:  Sherry Oneal  is a 71 y.o. female with a known history of multiple myeloma on chemo and chronic Lovenox now with lower abdominal pain with diarrhea and rectal bleeding. Pt c/o fever and chills. In ER, rectal bleeding noted. Pt tachycardic and hgb lower than baseline. She is now admitted. No n/V. Denies CP or SOB. No cough. Last Lovenox shot was yesterday  VITAL SIGNS:  Blood pressure (!) 125/42, pulse 95, temperature 98.2 F (36.8 C), temperature source Oral, resp. rate 18, height _0  (1.626 m), weight 65.8 kg (145 lb), SpO2 98 %.  I/O:   Intake/Output Summary (Last 24 hours) at 02/01/17 1346 Last data filed at 02/01/17 1309  Gross per 24  hour  Intake              880 ml  Output                3 ml  Net              877 ml    PHYSICAL EXAMINATION:   GENERAL:  70 y.o.-year-old patient lying in the bed with no acute distress.  EYES: Pupils equal, round, reactive to light and  accommodation. No scleral icterus. Extraocular muscles intact.  HEENT: Head atraumatic, normocephalic. Oropharynx and nasopharynx clear.  NECK:  Supple, no jugular venous distention. No thyroid enlargement, no tenderness.  LUNGS: Normal breath sounds bilaterally, no wheezing, rales,rhonchi or crepitation. No use of accessory muscles of respiration.  CARDIOVASCULAR: S1, S2 normal. No murmurs, rubs, or gallops.  ABDOMEN: Soft, mild lower abdomen tender, nondistended. Bowel sounds present. No organomegaly or mass.  EXTREMITIES: No pedal edema, cyanosis, or clubbing.  NEUROLOGIC: Cranial nerves II through XII are intact. Muscle strength 5/5 in all extremities. Sensation intact. Gait not checked.  PSYCHIATRIC: The patient is alert and oriented x 3.  SKIN: No obvious rash, lesion, or ulcer.   DATA REVIEW:   CBC  Recent Labs Lab 01/31/17 0413  WBC 9.9  HGB 9.6*  HCT 28.2*  PLT 139*    Chemistries   Recent Labs Lab 01/31/17 0413  NA 133*  K 4.2  CL 108  CO2 22  GLUCOSE 113*  BUN 14  CREATININE 1.13*  CALCIUM 7.8*  AST 19  ALT 17  ALKPHOS 55  BILITOT 0.4    Cardiac Enzymes No results for input(s): TROPONINI in the last 168 hours.  Microbiology Results  Results for orders placed or performed during the hospital encounter of 01/30/17  C difficile quick screen w PCR reflex     Status: None   Collection Time: 01/31/17  6:50 PM  Result Value Ref Range Status   C Diff antigen NEGATIVE NEGATIVE Final   C Diff toxin NEGATIVE NEGATIVE Final   C Diff interpretation No C. difficile detected.  Final  Gastrointestinal Panel by PCR , Stool     Status: None   Collection Time: 01/31/17  6:50 PM  Result Value Ref Range Status   Campylobacter  species NOT DETECTED NOT DETECTED Final   Plesimonas shigelloides NOT DETECTED NOT DETECTED Final   Salmonella species NOT DETECTED NOT DETECTED Final   Yersinia enterocolitica NOT DETECTED NOT DETECTED Final   Vibrio species NOT DETECTED NOT DETECTED Final   Vibrio cholerae NOT DETECTED NOT DETECTED Final   Enteroaggregative E coli (EAEC) NOT DETECTED NOT DETECTED Final   Enteropathogenic E coli (EPEC) NOT DETECTED NOT DETECTED Final   Enterotoxigenic E coli (ETEC) NOT DETECTED NOT DETECTED Final   Shiga like toxin producing E coli (STEC) NOT DETECTED NOT DETECTED Final   Shigella/Enteroinvasive E coli (EIEC) NOT DETECTED NOT DETECTED Final   Cryptosporidium NOT DETECTED NOT DETECTED Final   Cyclospora cayetanensis NOT DETECTED NOT DETECTED Final   Entamoeba histolytica NOT DETECTED NOT DETECTED Final   Giardia lamblia NOT DETECTED NOT DETECTED Final   Adenovirus F40/41 NOT DETECTED NOT DETECTED Final   Astrovirus NOT DETECTED NOT DETECTED Final   Norovirus GI/GII NOT DETECTED NOT DETECTED Final   Rotavirus A NOT DETECTED NOT DETECTED Final   Sapovirus (I, II, IV, and V) NOT DETECTED NOT DETECTED Final    RADIOLOGY:  Ct Abdomen Pelvis Wo Contrast  Result Date: 01/30/2017 CLINICAL DATA:  Hematochezia. EXAM: CT ABDOMEN AND PELVIS WITHOUT CONTRAST TECHNIQUE: Multidetector CT imaging of the abdomen and pelvis was performed following the standard protocol without IV contrast. COMPARISON:  None. FINDINGS: Lower chest: No acute abnormality. Hepatobiliary: Unenhanced appearance of the liver and gallbladder are unremarkable. Pancreas: Unremarkable unenhanced appearance. Spleen: Unremarkable. Adrenals/Urinary Tract: The left kidney is atrophic relative to the right. Both kidneys demonstrate areas of dystrophic calcification involving cortex with cortical scarring. No hydronephrosis. Stomach/Bowel: No evidence of bowel obstruction. Oral contrast  was administered. Without IV contrast, it is difficult to  evaluate enhancement of the bowel. However, based on appearance of the colon, there is some suspicion of potential colitis involving the descending and sigmoid colon. No evidence of significant colonic diverticular disease, acute diverticulitis, free air or abscess. Vascular/Lymphatic: No enlarged lymph nodes are identified. The abdominal aorta shows normal caliber. Reproductive: Uterus and bilateral adnexa are unremarkable. Other: No abdominal wall hernia or abnormality. No abdominopelvic ascites. Musculoskeletal: No acute or significant osseous findings. IMPRESSION: 1. Suspect colitis involving the descending and sigmoid colon. No evidence of bowel perforation, obstruction or diverticular disease. 2. Scarring of both kidneys with dystrophic calcification involving bilateral renal cortex and relative atrophy of the left kidney compared to the right. Electronically Signed   By: Aletta Edouard M.D.   On: 01/30/2017 15:07    EKG:   Orders placed or performed during the hospital encounter of 11/06/16  . ED EKG  . ED EKG    Management plans discussed with the patient, family and they are in agreement.  CODE STATUS:     Code Status Orders        Start     Ordered   01/30/17 1518  Full code  Continuous     01/30/17 1517    Code Status History    Date Active Date Inactive Code Status Order ID Comments User Context   This patient has a current code status but no historical code status.    Advance Directive Documentation   Flowsheet Row Most Recent Value  Type of Advance Directive  Healthcare Power of Attorney, Living will  Pre-existing out of facility DNR order (yellow form or pink MOST form)  No data  "MOST" Form in Place?  No data      TOTAL TIME TAKING CARE OF THIS PATIENT: 35 minutes.    Vaughan Basta M.D on 02/01/2017 at 1:46 PM  Between 7am to 6pm - Pager - [phone]  After 6pm go to [email or web address] - password [email or web address] [place] [facility]  Office   [phone]  CC: Primary care physician; Lavera Guise, MD   Note: This dictation was prepared with Dragon dictation along with smaller phrase technology. Any transcriptional errors that result from this process are unintentional.

## 2017-02-02 DIAGNOSIS — I10 Essential (primary) hypertension: Secondary | ICD-10-CM | POA: Diagnosis not present

## 2017-02-02 DIAGNOSIS — E041 Nontoxic single thyroid nodule: Secondary | ICD-10-CM | POA: Diagnosis not present

## 2017-02-02 DIAGNOSIS — Z886 Allergy status to analgesic agent status: Secondary | ICD-10-CM | POA: Diagnosis not present

## 2017-02-02 DIAGNOSIS — A09 Infectious gastroenteritis and colitis, unspecified: Secondary | ICD-10-CM | POA: Diagnosis not present

## 2017-02-02 DIAGNOSIS — Z880 Allergy status to penicillin: Secondary | ICD-10-CM | POA: Diagnosis not present

## 2017-02-02 DIAGNOSIS — Z885 Allergy status to narcotic agent status: Secondary | ICD-10-CM | POA: Diagnosis not present

## 2017-02-02 DIAGNOSIS — E785 Hyperlipidemia, unspecified: Secondary | ICD-10-CM | POA: Diagnosis not present

## 2017-02-02 DIAGNOSIS — C9000 Multiple myeloma not having achieved remission: Secondary | ICD-10-CM | POA: Diagnosis not present

## 2017-02-02 DIAGNOSIS — K0889 Other specified disorders of teeth and supporting structures: Secondary | ICD-10-CM | POA: Diagnosis not present

## 2017-02-02 DIAGNOSIS — N289 Disorder of kidney and ureter, unspecified: Secondary | ICD-10-CM | POA: Diagnosis not present

## 2017-02-09 DIAGNOSIS — Z5112 Encounter for antineoplastic immunotherapy: Secondary | ICD-10-CM | POA: Diagnosis not present

## 2017-02-09 DIAGNOSIS — C9000 Multiple myeloma not having achieved remission: Secondary | ICD-10-CM | POA: Diagnosis not present

## 2017-02-12 DIAGNOSIS — C9000 Multiple myeloma not having achieved remission: Secondary | ICD-10-CM | POA: Diagnosis not present

## 2017-02-12 DIAGNOSIS — Z79899 Other long term (current) drug therapy: Secondary | ICD-10-CM | POA: Diagnosis not present

## 2017-02-12 DIAGNOSIS — Z5112 Encounter for antineoplastic immunotherapy: Secondary | ICD-10-CM | POA: Diagnosis not present

## 2017-02-16 DIAGNOSIS — Z5111 Encounter for antineoplastic chemotherapy: Secondary | ICD-10-CM | POA: Diagnosis not present

## 2017-02-16 DIAGNOSIS — C9000 Multiple myeloma not having achieved remission: Secondary | ICD-10-CM | POA: Diagnosis not present

## 2017-02-19 DIAGNOSIS — C9000 Multiple myeloma not having achieved remission: Secondary | ICD-10-CM | POA: Diagnosis not present

## 2017-02-19 DIAGNOSIS — Z5112 Encounter for antineoplastic immunotherapy: Secondary | ICD-10-CM | POA: Diagnosis not present

## 2017-03-02 DIAGNOSIS — C9000 Multiple myeloma not having achieved remission: Secondary | ICD-10-CM | POA: Diagnosis not present

## 2017-03-02 DIAGNOSIS — Z5112 Encounter for antineoplastic immunotherapy: Secondary | ICD-10-CM | POA: Diagnosis not present

## 2017-03-02 DIAGNOSIS — Z888 Allergy status to other drugs, medicaments and biological substances status: Secondary | ICD-10-CM | POA: Diagnosis not present

## 2017-03-02 DIAGNOSIS — Z882 Allergy status to sulfonamides status: Secondary | ICD-10-CM | POA: Diagnosis not present

## 2017-03-02 DIAGNOSIS — E041 Nontoxic single thyroid nodule: Secondary | ICD-10-CM | POA: Diagnosis not present

## 2017-03-02 DIAGNOSIS — N289 Disorder of kidney and ureter, unspecified: Secondary | ICD-10-CM | POA: Diagnosis not present

## 2017-03-02 DIAGNOSIS — R110 Nausea: Secondary | ICD-10-CM | POA: Diagnosis not present

## 2017-03-02 DIAGNOSIS — Z881 Allergy status to other antibiotic agents status: Secondary | ICD-10-CM | POA: Diagnosis not present

## 2017-03-02 DIAGNOSIS — Z880 Allergy status to penicillin: Secondary | ICD-10-CM | POA: Diagnosis not present

## 2017-03-02 DIAGNOSIS — Z79899 Other long term (current) drug therapy: Secondary | ICD-10-CM | POA: Diagnosis not present

## 2017-03-09 DIAGNOSIS — C9000 Multiple myeloma not having achieved remission: Secondary | ICD-10-CM | POA: Diagnosis not present

## 2017-03-16 DIAGNOSIS — C9000 Multiple myeloma not having achieved remission: Secondary | ICD-10-CM | POA: Diagnosis not present

## 2017-03-22 DIAGNOSIS — C9000 Multiple myeloma not having achieved remission: Secondary | ICD-10-CM | POA: Diagnosis not present

## 2017-03-22 DIAGNOSIS — Z5111 Encounter for antineoplastic chemotherapy: Secondary | ICD-10-CM | POA: Diagnosis not present

## 2017-03-22 DIAGNOSIS — Z79899 Other long term (current) drug therapy: Secondary | ICD-10-CM | POA: Diagnosis not present

## 2017-03-22 DIAGNOSIS — Z5112 Encounter for antineoplastic immunotherapy: Secondary | ICD-10-CM | POA: Diagnosis not present

## 2017-03-30 DIAGNOSIS — Z5112 Encounter for antineoplastic immunotherapy: Secondary | ICD-10-CM | POA: Diagnosis not present

## 2017-03-30 DIAGNOSIS — C9000 Multiple myeloma not having achieved remission: Secondary | ICD-10-CM | POA: Diagnosis not present

## 2017-04-06 DIAGNOSIS — Z888 Allergy status to other drugs, medicaments and biological substances status: Secondary | ICD-10-CM | POA: Diagnosis not present

## 2017-04-06 DIAGNOSIS — Z886 Allergy status to analgesic agent status: Secondary | ICD-10-CM | POA: Diagnosis not present

## 2017-04-06 DIAGNOSIS — Z5112 Encounter for antineoplastic immunotherapy: Secondary | ICD-10-CM | POA: Diagnosis not present

## 2017-04-06 DIAGNOSIS — Z79899 Other long term (current) drug therapy: Secondary | ICD-10-CM | POA: Diagnosis not present

## 2017-04-06 DIAGNOSIS — Z880 Allergy status to penicillin: Secondary | ICD-10-CM | POA: Diagnosis not present

## 2017-04-06 DIAGNOSIS — Z881 Allergy status to other antibiotic agents status: Secondary | ICD-10-CM | POA: Diagnosis not present

## 2017-04-06 DIAGNOSIS — Z7901 Long term (current) use of anticoagulants: Secondary | ICD-10-CM | POA: Diagnosis not present

## 2017-04-06 DIAGNOSIS — C9000 Multiple myeloma not having achieved remission: Secondary | ICD-10-CM | POA: Diagnosis not present

## 2017-04-06 DIAGNOSIS — Z885 Allergy status to narcotic agent status: Secondary | ICD-10-CM | POA: Diagnosis not present

## 2017-04-13 DIAGNOSIS — Z886 Allergy status to analgesic agent status: Secondary | ICD-10-CM | POA: Diagnosis not present

## 2017-04-13 DIAGNOSIS — C9000 Multiple myeloma not having achieved remission: Secondary | ICD-10-CM | POA: Diagnosis not present

## 2017-04-13 DIAGNOSIS — I10 Essential (primary) hypertension: Secondary | ICD-10-CM | POA: Diagnosis not present

## 2017-04-13 DIAGNOSIS — E785 Hyperlipidemia, unspecified: Secondary | ICD-10-CM | POA: Diagnosis not present

## 2017-04-13 DIAGNOSIS — Z881 Allergy status to other antibiotic agents status: Secondary | ICD-10-CM | POA: Diagnosis not present

## 2017-04-13 DIAGNOSIS — Z882 Allergy status to sulfonamides status: Secondary | ICD-10-CM | POA: Diagnosis not present

## 2017-04-13 DIAGNOSIS — Z6823 Body mass index (BMI) 23.0-23.9, adult: Secondary | ICD-10-CM | POA: Diagnosis not present

## 2017-04-13 DIAGNOSIS — Z862 Personal history of diseases of the blood and blood-forming organs and certain disorders involving the immune mechanism: Secondary | ICD-10-CM | POA: Diagnosis not present

## 2017-04-13 DIAGNOSIS — Z885 Allergy status to narcotic agent status: Secondary | ICD-10-CM | POA: Diagnosis not present

## 2017-04-13 DIAGNOSIS — Z880 Allergy status to penicillin: Secondary | ICD-10-CM | POA: Diagnosis not present

## 2017-04-16 DIAGNOSIS — I10 Essential (primary) hypertension: Secondary | ICD-10-CM | POA: Diagnosis not present

## 2017-04-16 DIAGNOSIS — C9000 Multiple myeloma not having achieved remission: Secondary | ICD-10-CM | POA: Diagnosis not present

## 2017-04-16 DIAGNOSIS — R69 Illness, unspecified: Secondary | ICD-10-CM | POA: Diagnosis not present

## 2017-04-16 DIAGNOSIS — E782 Mixed hyperlipidemia: Secondary | ICD-10-CM | POA: Diagnosis not present

## 2017-04-20 DIAGNOSIS — Z5111 Encounter for antineoplastic chemotherapy: Secondary | ICD-10-CM | POA: Diagnosis not present

## 2017-04-20 DIAGNOSIS — C9000 Multiple myeloma not having achieved remission: Secondary | ICD-10-CM | POA: Diagnosis not present

## 2017-04-27 DIAGNOSIS — Z5111 Encounter for antineoplastic chemotherapy: Secondary | ICD-10-CM | POA: Diagnosis not present

## 2017-04-27 DIAGNOSIS — Z79899 Other long term (current) drug therapy: Secondary | ICD-10-CM | POA: Diagnosis not present

## 2017-04-27 DIAGNOSIS — C9000 Multiple myeloma not having achieved remission: Secondary | ICD-10-CM | POA: Diagnosis not present

## 2017-04-27 DIAGNOSIS — Z5112 Encounter for antineoplastic immunotherapy: Secondary | ICD-10-CM | POA: Diagnosis not present

## 2017-05-04 DIAGNOSIS — C9000 Multiple myeloma not having achieved remission: Secondary | ICD-10-CM | POA: Diagnosis not present

## 2017-05-04 DIAGNOSIS — Z5112 Encounter for antineoplastic immunotherapy: Secondary | ICD-10-CM | POA: Diagnosis not present

## 2017-05-06 DIAGNOSIS — H40003 Preglaucoma, unspecified, bilateral: Secondary | ICD-10-CM | POA: Diagnosis not present

## 2017-05-11 DIAGNOSIS — Z79899 Other long term (current) drug therapy: Secondary | ICD-10-CM | POA: Diagnosis not present

## 2017-05-11 DIAGNOSIS — C9000 Multiple myeloma not having achieved remission: Secondary | ICD-10-CM | POA: Diagnosis not present

## 2017-05-11 DIAGNOSIS — Z5112 Encounter for antineoplastic immunotherapy: Secondary | ICD-10-CM | POA: Diagnosis not present

## 2017-05-18 DIAGNOSIS — N289 Disorder of kidney and ureter, unspecified: Secondary | ICD-10-CM | POA: Diagnosis not present

## 2017-05-18 DIAGNOSIS — C9000 Multiple myeloma not having achieved remission: Secondary | ICD-10-CM | POA: Diagnosis not present

## 2017-05-18 DIAGNOSIS — E041 Nontoxic single thyroid nodule: Secondary | ICD-10-CM | POA: Diagnosis not present

## 2017-05-18 DIAGNOSIS — I129 Hypertensive chronic kidney disease with stage 1 through stage 4 chronic kidney disease, or unspecified chronic kidney disease: Secondary | ICD-10-CM | POA: Diagnosis not present

## 2017-05-18 DIAGNOSIS — A09 Infectious gastroenteritis and colitis, unspecified: Secondary | ICD-10-CM | POA: Diagnosis not present

## 2017-05-18 DIAGNOSIS — T451X5A Adverse effect of antineoplastic and immunosuppressive drugs, initial encounter: Secondary | ICD-10-CM | POA: Diagnosis not present

## 2017-05-18 DIAGNOSIS — D701 Agranulocytosis secondary to cancer chemotherapy: Secondary | ICD-10-CM | POA: Diagnosis not present

## 2017-05-18 DIAGNOSIS — N189 Chronic kidney disease, unspecified: Secondary | ICD-10-CM | POA: Diagnosis not present

## 2017-05-18 DIAGNOSIS — Z5112 Encounter for antineoplastic immunotherapy: Secondary | ICD-10-CM | POA: Diagnosis not present

## 2017-05-18 DIAGNOSIS — D709 Neutropenia, unspecified: Secondary | ICD-10-CM | POA: Diagnosis not present

## 2017-05-18 DIAGNOSIS — K529 Noninfective gastroenteritis and colitis, unspecified: Secondary | ICD-10-CM | POA: Diagnosis not present

## 2017-05-18 DIAGNOSIS — D61810 Antineoplastic chemotherapy induced pancytopenia: Secondary | ICD-10-CM | POA: Insufficient documentation

## 2017-05-18 DIAGNOSIS — K5909 Other constipation: Secondary | ICD-10-CM | POA: Diagnosis not present

## 2017-06-01 DIAGNOSIS — C9000 Multiple myeloma not having achieved remission: Secondary | ICD-10-CM | POA: Diagnosis not present

## 2017-06-01 DIAGNOSIS — Z5112 Encounter for antineoplastic immunotherapy: Secondary | ICD-10-CM | POA: Diagnosis not present

## 2017-06-01 DIAGNOSIS — Z79899 Other long term (current) drug therapy: Secondary | ICD-10-CM | POA: Diagnosis not present

## 2017-06-14 ENCOUNTER — Other Ambulatory Visit: Payer: Self-pay | Admitting: Otolaryngology

## 2017-06-14 DIAGNOSIS — H6980 Other specified disorders of Eustachian tube, unspecified ear: Secondary | ICD-10-CM | POA: Diagnosis not present

## 2017-06-14 DIAGNOSIS — H903 Sensorineural hearing loss, bilateral: Secondary | ICD-10-CM | POA: Diagnosis not present

## 2017-06-14 DIAGNOSIS — IMO0001 Reserved for inherently not codable concepts without codable children: Secondary | ICD-10-CM

## 2017-06-14 DIAGNOSIS — H9042 Sensorineural hearing loss, unilateral, left ear, with unrestricted hearing on the contralateral side: Secondary | ICD-10-CM

## 2017-06-14 DIAGNOSIS — H90A22 Sensorineural hearing loss, unilateral, left ear, with restricted hearing on the contralateral side: Secondary | ICD-10-CM | POA: Diagnosis not present

## 2017-06-15 DIAGNOSIS — I10 Essential (primary) hypertension: Secondary | ICD-10-CM | POA: Diagnosis not present

## 2017-06-15 DIAGNOSIS — E785 Hyperlipidemia, unspecified: Secondary | ICD-10-CM | POA: Diagnosis not present

## 2017-06-15 DIAGNOSIS — K529 Noninfective gastroenteritis and colitis, unspecified: Secondary | ICD-10-CM | POA: Diagnosis not present

## 2017-06-15 DIAGNOSIS — K5900 Constipation, unspecified: Secondary | ICD-10-CM | POA: Diagnosis not present

## 2017-06-15 DIAGNOSIS — C9000 Multiple myeloma not having achieved remission: Secondary | ICD-10-CM | POA: Diagnosis not present

## 2017-06-15 DIAGNOSIS — E041 Nontoxic single thyroid nodule: Secondary | ICD-10-CM | POA: Diagnosis not present

## 2017-06-15 DIAGNOSIS — R5383 Other fatigue: Secondary | ICD-10-CM | POA: Diagnosis not present

## 2017-06-15 DIAGNOSIS — D709 Neutropenia, unspecified: Secondary | ICD-10-CM | POA: Diagnosis not present

## 2017-06-15 DIAGNOSIS — N289 Disorder of kidney and ureter, unspecified: Secondary | ICD-10-CM | POA: Diagnosis not present

## 2017-06-15 DIAGNOSIS — Z5112 Encounter for antineoplastic immunotherapy: Secondary | ICD-10-CM | POA: Diagnosis not present

## 2017-06-24 ENCOUNTER — Ambulatory Visit
Admission: RE | Admit: 2017-06-24 | Discharge: 2017-06-24 | Disposition: A | Discharge status: Home / Self Care | Payer: Medicare HMO | Source: Ambulatory Visit | Attending: Otolaryngology | Admitting: Otolaryngology

## 2017-06-24 DIAGNOSIS — IMO0001 Reserved for inherently not codable concepts without codable children: Secondary | ICD-10-CM

## 2017-06-24 DIAGNOSIS — G936 Cerebral edema: Secondary | ICD-10-CM | POA: Diagnosis not present

## 2017-06-24 DIAGNOSIS — I6782 Cerebral ischemia: Secondary | ICD-10-CM | POA: Diagnosis not present

## 2017-06-24 DIAGNOSIS — H9042 Sensorineural hearing loss, unilateral, left ear, with unrestricted hearing on the contralateral side: Secondary | ICD-10-CM | POA: Diagnosis present

## 2017-06-24 DIAGNOSIS — J323 Chronic sphenoidal sinusitis: Secondary | ICD-10-CM | POA: Diagnosis not present

## 2017-06-24 DIAGNOSIS — H9190 Unspecified hearing loss, unspecified ear: Secondary | ICD-10-CM | POA: Diagnosis not present

## 2017-06-24 MED ORDER — GADOBENATE DIMEGLUMINE 529 MG/ML IV SOLN
15.0000 mL | Freq: Once | INTRAVENOUS | Status: AC | PRN
  Administered 2017-06-24: 14 mL via INTRAVENOUS

## 2017-06-29 DIAGNOSIS — Z5111 Encounter for antineoplastic chemotherapy: Secondary | ICD-10-CM | POA: Diagnosis not present

## 2017-06-29 DIAGNOSIS — C9000 Multiple myeloma not having achieved remission: Secondary | ICD-10-CM | POA: Diagnosis not present

## 2017-06-29 DIAGNOSIS — Z79899 Other long term (current) drug therapy: Secondary | ICD-10-CM | POA: Diagnosis not present

## 2017-07-02 DIAGNOSIS — J323 Chronic sphenoidal sinusitis: Secondary | ICD-10-CM | POA: Diagnosis not present

## 2017-07-02 DIAGNOSIS — H90A22 Sensorineural hearing loss, unilateral, left ear, with restricted hearing on the contralateral side: Secondary | ICD-10-CM | POA: Diagnosis not present

## 2017-07-13 DIAGNOSIS — D709 Neutropenia, unspecified: Secondary | ICD-10-CM | POA: Diagnosis not present

## 2017-07-13 DIAGNOSIS — E041 Nontoxic single thyroid nodule: Secondary | ICD-10-CM | POA: Diagnosis not present

## 2017-07-13 DIAGNOSIS — R21 Rash and other nonspecific skin eruption: Secondary | ICD-10-CM | POA: Diagnosis not present

## 2017-07-13 DIAGNOSIS — K5900 Constipation, unspecified: Secondary | ICD-10-CM | POA: Diagnosis not present

## 2017-07-13 DIAGNOSIS — Z5112 Encounter for antineoplastic immunotherapy: Secondary | ICD-10-CM | POA: Diagnosis not present

## 2017-07-13 DIAGNOSIS — N289 Disorder of kidney and ureter, unspecified: Secondary | ICD-10-CM | POA: Diagnosis not present

## 2017-07-13 DIAGNOSIS — T451X5A Adverse effect of antineoplastic and immunosuppressive drugs, initial encounter: Secondary | ICD-10-CM | POA: Diagnosis not present

## 2017-07-13 DIAGNOSIS — R5383 Other fatigue: Secondary | ICD-10-CM | POA: Diagnosis not present

## 2017-07-13 DIAGNOSIS — K529 Noninfective gastroenteritis and colitis, unspecified: Secondary | ICD-10-CM | POA: Diagnosis not present

## 2017-07-13 DIAGNOSIS — C9000 Multiple myeloma not having achieved remission: Secondary | ICD-10-CM | POA: Diagnosis not present

## 2017-07-14 DIAGNOSIS — R69 Illness, unspecified: Secondary | ICD-10-CM | POA: Diagnosis not present

## 2017-07-27 DIAGNOSIS — Z5111 Encounter for antineoplastic chemotherapy: Secondary | ICD-10-CM | POA: Diagnosis not present

## 2017-07-27 DIAGNOSIS — C9000 Multiple myeloma not having achieved remission: Secondary | ICD-10-CM | POA: Diagnosis not present

## 2017-07-30 DIAGNOSIS — J323 Chronic sphenoidal sinusitis: Secondary | ICD-10-CM | POA: Diagnosis not present

## 2017-08-10 DIAGNOSIS — T451X5A Adverse effect of antineoplastic and immunosuppressive drugs, initial encounter: Secondary | ICD-10-CM | POA: Diagnosis not present

## 2017-08-10 DIAGNOSIS — C9001 Multiple myeloma in remission: Secondary | ICD-10-CM | POA: Diagnosis not present

## 2017-08-10 DIAGNOSIS — Z5112 Encounter for antineoplastic immunotherapy: Secondary | ICD-10-CM | POA: Diagnosis not present

## 2017-08-10 DIAGNOSIS — K529 Noninfective gastroenteritis and colitis, unspecified: Secondary | ICD-10-CM | POA: Diagnosis not present

## 2017-08-10 DIAGNOSIS — C9000 Multiple myeloma not having achieved remission: Secondary | ICD-10-CM | POA: Diagnosis not present

## 2017-08-10 DIAGNOSIS — R21 Rash and other nonspecific skin eruption: Secondary | ICD-10-CM | POA: Diagnosis not present

## 2017-08-10 DIAGNOSIS — N289 Disorder of kidney and ureter, unspecified: Secondary | ICD-10-CM | POA: Diagnosis not present

## 2017-08-10 DIAGNOSIS — K5900 Constipation, unspecified: Secondary | ICD-10-CM | POA: Diagnosis not present

## 2017-08-10 DIAGNOSIS — E785 Hyperlipidemia, unspecified: Secondary | ICD-10-CM | POA: Diagnosis not present

## 2017-08-10 DIAGNOSIS — R530 Neoplastic (malignant) related fatigue: Secondary | ICD-10-CM | POA: Diagnosis not present

## 2017-08-10 DIAGNOSIS — D701 Agranulocytosis secondary to cancer chemotherapy: Secondary | ICD-10-CM | POA: Diagnosis not present

## 2017-08-16 DIAGNOSIS — J323 Chronic sphenoidal sinusitis: Secondary | ICD-10-CM | POA: Diagnosis not present

## 2017-08-16 DIAGNOSIS — H903 Sensorineural hearing loss, bilateral: Secondary | ICD-10-CM | POA: Diagnosis not present

## 2017-09-03 DIAGNOSIS — R0609 Other forms of dyspnea: Secondary | ICD-10-CM | POA: Insufficient documentation

## 2017-09-07 DIAGNOSIS — R5383 Other fatigue: Secondary | ICD-10-CM | POA: Diagnosis not present

## 2017-09-07 DIAGNOSIS — R0609 Other forms of dyspnea: Secondary | ICD-10-CM | POA: Diagnosis not present

## 2017-09-07 DIAGNOSIS — T451X5A Adverse effect of antineoplastic and immunosuppressive drugs, initial encounter: Secondary | ICD-10-CM | POA: Diagnosis not present

## 2017-09-07 DIAGNOSIS — E041 Nontoxic single thyroid nodule: Secondary | ICD-10-CM | POA: Diagnosis not present

## 2017-09-07 DIAGNOSIS — C9001 Multiple myeloma in remission: Secondary | ICD-10-CM | POA: Diagnosis not present

## 2017-09-07 DIAGNOSIS — D701 Agranulocytosis secondary to cancer chemotherapy: Secondary | ICD-10-CM | POA: Diagnosis not present

## 2017-09-07 DIAGNOSIS — D709 Neutropenia, unspecified: Secondary | ICD-10-CM | POA: Diagnosis not present

## 2017-09-07 DIAGNOSIS — R110 Nausea: Secondary | ICD-10-CM | POA: Diagnosis not present

## 2017-09-07 DIAGNOSIS — R197 Diarrhea, unspecified: Secondary | ICD-10-CM | POA: Diagnosis not present

## 2017-09-07 DIAGNOSIS — N289 Disorder of kidney and ureter, unspecified: Secondary | ICD-10-CM | POA: Diagnosis not present

## 2017-09-07 DIAGNOSIS — Z882 Allergy status to sulfonamides status: Secondary | ICD-10-CM | POA: Diagnosis not present

## 2017-09-07 DIAGNOSIS — K5900 Constipation, unspecified: Secondary | ICD-10-CM | POA: Diagnosis not present

## 2017-09-14 DIAGNOSIS — C9001 Multiple myeloma in remission: Secondary | ICD-10-CM | POA: Diagnosis not present

## 2017-09-14 DIAGNOSIS — R0609 Other forms of dyspnea: Secondary | ICD-10-CM | POA: Diagnosis not present

## 2017-09-28 DIAGNOSIS — Z5112 Encounter for antineoplastic immunotherapy: Secondary | ICD-10-CM | POA: Diagnosis not present

## 2017-09-28 DIAGNOSIS — C9001 Multiple myeloma in remission: Secondary | ICD-10-CM | POA: Diagnosis not present

## 2017-09-28 DIAGNOSIS — R0609 Other forms of dyspnea: Secondary | ICD-10-CM | POA: Diagnosis not present

## 2017-10-12 DIAGNOSIS — M8588 Other specified disorders of bone density and structure, other site: Secondary | ICD-10-CM | POA: Diagnosis not present

## 2017-10-12 DIAGNOSIS — R5382 Chronic fatigue, unspecified: Secondary | ICD-10-CM | POA: Diagnosis not present

## 2017-10-12 DIAGNOSIS — R5383 Other fatigue: Secondary | ICD-10-CM | POA: Diagnosis not present

## 2017-10-12 DIAGNOSIS — E041 Nontoxic single thyroid nodule: Secondary | ICD-10-CM | POA: Diagnosis not present

## 2017-10-12 DIAGNOSIS — D701 Agranulocytosis secondary to cancer chemotherapy: Secondary | ICD-10-CM | POA: Diagnosis not present

## 2017-10-12 DIAGNOSIS — M549 Dorsalgia, unspecified: Secondary | ICD-10-CM | POA: Diagnosis not present

## 2017-10-12 DIAGNOSIS — R0609 Other forms of dyspnea: Secondary | ICD-10-CM | POA: Diagnosis not present

## 2017-10-12 DIAGNOSIS — T451X5A Adverse effect of antineoplastic and immunosuppressive drugs, initial encounter: Secondary | ICD-10-CM | POA: Diagnosis not present

## 2017-10-12 DIAGNOSIS — M479 Spondylosis, unspecified: Secondary | ICD-10-CM | POA: Diagnosis not present

## 2017-10-12 DIAGNOSIS — K5900 Constipation, unspecified: Secondary | ICD-10-CM | POA: Diagnosis not present

## 2017-10-12 DIAGNOSIS — C9001 Multiple myeloma in remission: Secondary | ICD-10-CM | POA: Diagnosis not present

## 2017-10-12 DIAGNOSIS — N281 Cyst of kidney, acquired: Secondary | ICD-10-CM | POA: Diagnosis not present

## 2017-10-12 DIAGNOSIS — R197 Diarrhea, unspecified: Secondary | ICD-10-CM | POA: Diagnosis not present

## 2017-10-16 DIAGNOSIS — R69 Illness, unspecified: Secondary | ICD-10-CM | POA: Diagnosis not present

## 2017-10-26 ENCOUNTER — Emergency Department: Payer: Medicare HMO

## 2017-10-26 ENCOUNTER — Emergency Department
Admission: EM | Admit: 2017-10-26 | Discharge: 2017-10-26 | Disposition: A | Discharge status: Home / Self Care | Payer: Medicare HMO | Attending: Emergency Medicine | Admitting: Emergency Medicine

## 2017-10-26 DIAGNOSIS — N390 Urinary tract infection, site not specified: Secondary | ICD-10-CM | POA: Diagnosis not present

## 2017-10-26 DIAGNOSIS — R1011 Right upper quadrant pain: Secondary | ICD-10-CM | POA: Diagnosis not present

## 2017-10-26 DIAGNOSIS — R1010 Upper abdominal pain, unspecified: Secondary | ICD-10-CM | POA: Insufficient documentation

## 2017-10-26 DIAGNOSIS — D649 Anemia, unspecified: Secondary | ICD-10-CM | POA: Diagnosis not present

## 2017-10-26 DIAGNOSIS — R112 Nausea with vomiting, unspecified: Secondary | ICD-10-CM | POA: Insufficient documentation

## 2017-10-26 DIAGNOSIS — Z79899 Other long term (current) drug therapy: Secondary | ICD-10-CM | POA: Insufficient documentation

## 2017-10-26 DIAGNOSIS — R197 Diarrhea, unspecified: Secondary | ICD-10-CM | POA: Insufficient documentation

## 2017-10-26 DIAGNOSIS — R8281 Pyuria: Secondary | ICD-10-CM

## 2017-10-26 DIAGNOSIS — Z9221 Personal history of antineoplastic chemotherapy: Secondary | ICD-10-CM | POA: Diagnosis not present

## 2017-10-26 DIAGNOSIS — T451X5A Adverse effect of antineoplastic and immunosuppressive drugs, initial encounter: Secondary | ICD-10-CM

## 2017-10-26 DIAGNOSIS — R109 Unspecified abdominal pain: Secondary | ICD-10-CM | POA: Diagnosis not present

## 2017-10-26 LAB — URINALYSIS, COMPLETE (UACMP) WITH MICROSCOPIC
BACTERIA UA: NONE SEEN
BILIRUBIN URINE: NEGATIVE
Glucose, UA: NEGATIVE mg/dL
HGB URINE DIPSTICK: NEGATIVE
KETONES UR: NEGATIVE mg/dL
Nitrite: NEGATIVE
PROTEIN: 30 mg/dL — AB
SPECIFIC GRAVITY, URINE: 1.014 (ref 1.005–1.030)
pH: 7 (ref 5.0–8.0)

## 2017-10-26 LAB — CBC
HEMATOCRIT: 42.4 % (ref 35.0–47.0)
HEMOGLOBIN: 14.1 g/dL (ref 12.0–16.0)
MCH: 31.1 pg (ref 26.0–34.0)
MCHC: 33.2 g/dL (ref 32.0–36.0)
MCV: 93.8 fL (ref 80.0–100.0)
Platelets: 227 10*3/uL (ref 150–440)
RBC: 4.52 MIL/uL (ref 3.80–5.20)
RDW: 16.5 % — ABNORMAL HIGH (ref 11.5–14.5)
WBC: 15.5 10*3/uL — AB (ref 3.6–11.0)

## 2017-10-26 LAB — COMPREHENSIVE METABOLIC PANEL
ALT: 21 U/L (ref 14–54)
AST: 28 U/L (ref 15–41)
Albumin: 4 g/dL (ref 3.5–5.0)
Alkaline Phosphatase: 58 U/L (ref 38–126)
Anion gap: 13 (ref 5–15)
BUN: 18 mg/dL (ref 6–20)
CHLORIDE: 105 mmol/L (ref 101–111)
CO2: 20 mmol/L — AB (ref 22–32)
Calcium: 9.4 mg/dL (ref 8.9–10.3)
Creatinine, Ser: 1.13 mg/dL — ABNORMAL HIGH (ref 0.44–1.00)
GFR, EST AFRICAN AMERICAN: 55 mL/min — AB (ref >60)
GFR, EST NON AFRICAN AMERICAN: 48 mL/min — AB (ref >60)
Glucose, Bld: 117 mg/dL — ABNORMAL HIGH (ref 65–99)
Potassium: 3.3 mmol/L — ABNORMAL LOW (ref 3.5–5.1)
SODIUM: 138 mmol/L (ref 135–145)
Total Bilirubin: 0.9 mg/dL (ref 0.3–1.2)
Total Protein: 6.5 g/dL (ref 6.5–8.1)

## 2017-10-26 LAB — LIPASE, BLOOD: LIPASE: 29 U/L (ref 11–51)

## 2017-10-26 MED ORDER — FOSFOMYCIN TROMETHAMINE 3 G PO PACK
3.0000 g | PACK | Freq: Once | ORAL | Status: AC
  Administered 2017-10-26: 3 g via ORAL
  Filled 2017-10-26: qty 3

## 2017-10-26 MED ORDER — POTASSIUM CHLORIDE 20 MEQ PO PACK
40.0000 meq | PACK | ORAL | Status: AC
  Administered 2017-10-26: 40 meq via ORAL
  Filled 2017-10-26: qty 2

## 2017-10-26 MED ORDER — SODIUM CHLORIDE 0.9 % IV BOLUS (SEPSIS)
500.0000 mL | INTRAVENOUS | Status: AC
  Administered 2017-10-26: 500 mL via INTRAVENOUS

## 2017-10-26 NOTE — ED Provider Notes (Signed)
[facility] Emergency Department Provider Note  ____________________________________________   First MD Initiated Contact with Patient 10/26/17 0202     (approximate)  I have reviewed the triage vital signs and the nursing notes.   HISTORY  Chief Complaint Nausea; Emesis; and Diarrhea    HPI Sherry Oneal is a 71 y.o. female whose medical history includes multiple myeloma for which she is actively undergoing chemotherapy and is followed at [facility].  She presents by EMS tonight for acute onset nausea, vomiting, diarrhea, and upper abdominal pain.  She states that it occurred about 3 hours after eating dinner.  Her husband shared some of the same food and is not ill.  She states that the symptoms were immediately severe and resulting simultaneously.  The pain started similar to prior episodes she has had, with aching and sharp upper abdominal pain that then developed into the vomiting and diarrhea.  The symptoms continued for at least an hour and were quite severe.  After getting Zofran by EMS she is feeling much better and is currently asymptomatic.  She denies fever/chills, shortness of breath, lower abdominal pain, and dysuria.  She had a few sharp pains in her chest during the time she was vomiting but is not certain if that was separate or a result of the vomiting.  Nothing in particular made the symptoms worse and Zofran made it better.  Past Medical History:  Diagnosis Date  . Anemia   . High blood pressure   . High cholesterol   . Multiple myeloma ([place])   . Renal insufficiency     Patient Active Problem List   Diagnosis Date Noted  . LGI bleed 01/30/2017  . Acute blood loss anemia 01/30/2017  . Abdominal pain 01/30/2017  . Multiple myeloma ([place]) 01/30/2017    Past Surgical History:  Procedure Laterality Date  . EXPLORATORY LAPAROTOMY      Prior to Admission medications   Medication Sig Start Date End Date Taking? Authorizing Provider    acetaminophen (TYLENOL) 500 MG tablet Take 500 mg by mouth every 4 (four) hours.    [provider]  albuterol (PROVENTIL HFA;VENTOLIN HFA) 108 (90 Base) MCG/ACT inhaler Inhale 2 puffs into the lungs every 6 (six) hours as needed for wheezing or shortness of breath. 11/06/16   Nance Pear, MD  ALPRAZolam Duanne Moron) 0.25 MG tablet Take 0.25 mg by mouth daily as needed.    [provider]  atorvastatin (LIPITOR) 10 MG tablet Take 10 mg by mouth daily. 10/05/16   [provider]  Calcium Carbonate-Vitamin D 600-400 MG-UNIT tablet Take 1 tablet by mouth daily.    [provider]  Cranberry 500 MG CAPS Take 500 mg by mouth every morning.    [provider]  diphenhydrAMINE (BENADRYL ALLERGY) 25 MG tablet Take 25 mg by mouth daily as needed.    [provider]  lisinopril (PRINIVIL,ZESTRIL) 10 MG tablet Take 10 mg by mouth daily.    [provider]  Melatonin 3 MG TABS Take 3 mg by mouth at bedtime as needed.    [provider]  Multiple Vitamins-Minerals (PRESERVISION AREDS PO) Take 1 tablet by mouth every morning.    [provider]  omeprazole (PRILOSEC) 20 MG capsule Take 20 mg by mouth daily. 01/20/17   [provider]  polyethylene glycol (MIRALAX / GLYCOLAX) packet Take 17 g by mouth daily as needed.    [provider]  prochlorperazine (COMPAZINE) 5 MG tablet Take 5 mg  by mouth every 8 (eight) hours as needed. 12/04/16   [provider]  triamcinolone cream (KENALOG) 0.1 % Apply 1 application topically 2 (two) times daily. 12/31/16 12/31/17  [provider]  valACYclovir (VALTREX) 500 MG tablet Take 500 mg by mouth daily. 01/20/17   [provider]    Allergies Atenolol; Doxycycline; Epinephrine; Ivp dye [iodinated diagnostic agents]; Nsaids; Penicillins; Sulfa antibiotics; and Tramadol  Family History  Problem Relation Age of Onset  . Breast cancer Maternal Grandmother 70     Social History Social History  Substance Use Topics  . Smoking status: Never Smoker  . Smokeless tobacco: Never Used  . Alcohol use No    Review of Systems Constitutional: No fever/chills Eyes: No visual changes. ENT: No sore throat. Cardiovascular: Brief sharp chest pains while she was vomiting Respiratory: Denies shortness of breath. Gastrointestinal: Acute onset upper abdominal pain with nausea, vomiting, and diarrhea.  No blood in her stool Genitourinary: Negative for dysuria. Musculoskeletal: Negative for neck pain.  Negative for back pain. Integumentary: Negative for rash. Neurological: Negative for headaches, focal weakness or numbness.   ____________________________________________   PHYSICAL EXAM:  VITAL SIGNS: ED Triage Vitals  Enc Vitals Group     BP 10/26/17 0118 109/62     Pulse Rate 10/26/17 0118 89     Resp 10/26/17 0118 18     Temp 10/26/17 0118 97.8 F (36.6 C)     Temp Source 10/26/17 0118 Oral     SpO2 10/26/17 0118 100 %     Weight 10/26/17 0119 62.6 kg (138 lb)     Height 10/26/17 0119 1.626 m (_0 )     Head Circumference --      Peak Flow --      Pain Score 10/26/17 0118 0     Pain Loc --      Pain Edu? --      Excl. in [place]? --     Constitutional: Alert and oriented. Well appearing and in no acute distress. Eyes: Conjunctivae are normal.  Head: Atraumatic. Nose: No congestion/rhinnorhea. Mouth/Throat: Mucous membranes are moist. Neck: No stridor.  No meningeal signs.   Cardiovascular: Normal rate, regular rhythm. Good peripheral circulation. Grossly normal heart sounds. Respiratory: Normal respiratory effort.  No retractions. Lungs CTAB. Gastrointestinal: Soft with mild tenderness to palpation of the epigastrium and right upper quadrant with a borderline Murphy's sign.  No lower abdominal tenderness.  No distention. Musculoskeletal: No lower extremity tenderness nor edema. No gross deformities of extremities. Neurologic:  Normal  speech and language. No gross focal neurologic deficits are appreciated.  Skin:  Skin is warm, dry and intact. No rash noted. Psychiatric: Mood and affect are normal. Speech and behavior are normal.  ____________________________________________   LABS (all labs ordered are listed, but only abnormal results are displayed)  Labs Reviewed  COMPREHENSIVE METABOLIC PANEL - Abnormal; Notable for the following:       Result Value   Potassium 3.3 (*)    CO2 20 (*)    Glucose, Bld 117 (*)    Creatinine, Ser 1.13 (*)    GFR calc non Af Amer 48 (*)    GFR calc Af Amer 55 (*)    All other components within normal limits  CBC - Abnormal; Notable for the following:    WBC 15.5 (*)    RDW 16.5 (*)    All other components within normal limits  URINALYSIS, COMPLETE (UACMP) WITH MICROSCOPIC - Abnormal; Notable for the following:  Color, Urine YELLOW (*)    APPearance HAZY (*)    Protein, ur 30 (*)    Leukocytes, UA TRACE (*)    Squamous Epithelial / LPF 0-5 (*)    All other components within normal limits  URINE CULTURE  LIPASE, BLOOD   ____________________________________________  EKG  None - EKG not ordered by ED physician ____________________________________________  RADIOLOGY   US Abdomen Limited Ruq  Result Date: 10/26/2017 CLINICAL DATA:  Acute onset of right upper quadrant and epigastric pain. Nausea, vomiting and diarrhea. EXAM: ULTRASOUND ABDOMEN LIMITED RIGHT UPPER QUADRANT COMPARISON:  CT 01/30/2017 FINDINGS: Gallbladder: Physiologically distended. No gallstones or wall thickening visualized. No sonographic Murphy sign noted by sonographer. Common bile duct: Diameter: 3 mm, normal. Liver: No focal lesion identified. Within normal limits in parenchymal echogenicity. Portal vein is patent on color Doppler imaging with normal direction of blood flow towards the liver. IMPRESSION: Unremarkable right upper quadrant ultrasound.  No gallstones. Electronically Signed   By: Jeb Levering M.D.   On: 10/26/2017 04:01    ____________________________________________   PROCEDURES  Critical Care performed: No   Procedure(s) performed:   Procedures   ____________________________________________   INITIAL IMPRESSION / ASSESSMENT AND PLAN / ED COURSE  As part of my medical decision making, I reviewed the following data within the [place] notes reviewed and incorporated, Labs reviewed  and EKG interpreted     Differential diagnosis includes, but is not limited to, biliary disease (biliary colic, acute cholecystitis, cholangitis, choledocholithiasis, etc), intrathoracic causes for epigastric abdominal pain including ACS, gastritis, duodenitis, pancreatitis, small bowel or large bowel obstruction, abdominal aortic aneurysm, hernia, gastritis, and viral infection.  Complications of multiple myeloma / chemotherapy are also possible.  Labs are pending.  Fortunately the patient feels much better at this time and is essentially asymptomatic.  She still has some mild tenderness to palpation which could be the result of all the vomiting earlier, but to her knowledge she has never been evaluated for gallbladder disease.  I will obtain an ultrasound to make sure that there is no evidence of acute cholecystitis or that the symptoms may be the result of biliary colic.  I will reassess after lab work and ultrasound results are back.   Clinical Course as of Oct 26 499  Tue Oct 26, 2017  0257 WBC: (!) 15.5 [CF]  5102 Unremarkable U/S with no gallstones. US ABDOMEN LIMITED RUQ [CF]  5852 Patient states she feels much better.  She is asymptomatic at this time.  Lab work is unremarkable except for a decreased potassium likely due to the vomiting and diarrhea.  She is tolerating ginger ale now.  Feels like she is ready to go home and note she does have 6-30 white blood cells in her urine but it is nitrite negative.  Given that she is on chemotherapy I think it  is reasonable and appropriate to treat her empirically and I will send a urine culture.  Given that this is likely a very mild urinary tract infection given that she is asymptomatic except for a few white blood cells, I will treat her with a one-time dose of fosfomycin and explained in the discharge instructions that a urine culture is pending so that she can inform her oncologist.  I gave my usual customary return precautions.  [CF]    Clinical Course User Index [CF] Hinda Kehr, MD    ____________________________________________  FINAL CLINICAL IMPRESSION(S) / ED DIAGNOSES  Final diagnoses:  Nausea vomiting  and diarrhea  Pyuria  Immunosuppressed due to chemotherapy     MEDICATIONS GIVEN DURING THIS VISIT:  Medications  sodium chloride 0.9 % bolus 500 mL (0 mLs Intravenous Stopped 10/26/17 0247)  potassium chloride (KLOR-CON) packet 40 mEq (40 mEq Oral Given 10/26/17 0446)  fosfomycin (MONUROL) packet 3 g (3 g Oral Given 10/26/17 0447)     NEW OUTPATIENT MEDICATIONS STARTED DURING THIS VISIT:  New Prescriptions   No medications on file    Modified Medications   No medications on file    Discontinued Medications   No medications on file     Note:  This document was prepared using Dragon voice recognition software and may include unintentional dictation errors.    Hinda Kehr, MD 10/26/17 [phone]

## 2017-10-26 NOTE — Discharge Instructions (Signed)
As we discussed, your workup today was reassuring.  Though we do not know exactly what is causing your symptoms, it appears that you have no emergent medical condition at this time and that you are safe to go home and follow up as recommended in this paperwork.    Your potassium level was a little bit low due to your vomiting and diarrhea, but we provided a supplement to bring the level back up.  Please read through the provided discharge instructions including potassium content of foods to continue to bring the level up.  You do have some white blood cells in your urine, but no definitive indication of urinary tract infection (UTI).  Since you are on chemotherapy, we gave you a dose of antibiotics that typically cures most mild UTIs (fosfomycin 3 g by mouth).  A urine culture is pending in our lab; your regular doctor(s) can follow up through the Epic computer system to find the results.  Please return immediately to the Emergency Department if you develop any new or worsening symptoms that concern you.

## 2017-10-26 NOTE — ED Notes (Signed)
Called Lab to check on status of blood results

## 2017-10-26 NOTE — ED Triage Notes (Signed)
Pt arrived via EMS from home with complaints of nausea, vomiting, and diarrhea. EMS stated that 3 hoursago  pt had just finished dinner pt reported upper quadrant pain and cramping along with having several bout of diarrhea and vomiting. Pt states that she is clammy and nauseous. Per EMS pt has a 22 in her Left Hand. Per EMS pt received 8mg  of Zofran.  Pt reported have 3 separate episodes of sharp pain under her Left breast that lasted for 3 seconds each time. Pt has a Hx of choelitis with a GI  Bleed back in Feb. Pt also has Hx of cancer (multiple meloma) which she is being treated for. VS per EMS orthostatic changes SUPINE- 112/56 HR- 90 SITTING-98/49 HR-98. CGB-152. 12-Lead NSR with PACs. Pt complaining of leg cramps and shaking. Husband at bedside.

## 2017-10-27 LAB — URINE CULTURE: Special Requests: NORMAL

## 2017-11-09 DIAGNOSIS — R0609 Other forms of dyspnea: Secondary | ICD-10-CM | POA: Diagnosis not present

## 2017-11-09 DIAGNOSIS — R5383 Other fatigue: Secondary | ICD-10-CM | POA: Diagnosis not present

## 2017-11-09 DIAGNOSIS — Z882 Allergy status to sulfonamides status: Secondary | ICD-10-CM | POA: Diagnosis not present

## 2017-11-09 DIAGNOSIS — Z886 Allergy status to analgesic agent status: Secondary | ICD-10-CM | POA: Diagnosis not present

## 2017-11-09 DIAGNOSIS — Z5112 Encounter for antineoplastic immunotherapy: Secondary | ICD-10-CM | POA: Diagnosis not present

## 2017-11-09 DIAGNOSIS — C9001 Multiple myeloma in remission: Secondary | ICD-10-CM | POA: Diagnosis not present

## 2017-11-09 DIAGNOSIS — D61818 Other pancytopenia: Secondary | ICD-10-CM | POA: Diagnosis not present

## 2017-11-09 DIAGNOSIS — T451X5A Adverse effect of antineoplastic and immunosuppressive drugs, initial encounter: Secondary | ICD-10-CM | POA: Diagnosis not present

## 2017-11-09 DIAGNOSIS — C9002 Multiple myeloma in relapse: Secondary | ICD-10-CM | POA: Diagnosis not present

## 2017-11-09 DIAGNOSIS — D701 Agranulocytosis secondary to cancer chemotherapy: Secondary | ICD-10-CM | POA: Diagnosis not present

## 2017-11-09 DIAGNOSIS — E041 Nontoxic single thyroid nodule: Secondary | ICD-10-CM | POA: Diagnosis not present

## 2017-11-10 DIAGNOSIS — H40003 Preglaucoma, unspecified, bilateral: Secondary | ICD-10-CM | POA: Diagnosis not present

## 2017-11-10 DIAGNOSIS — H353132 Nonexudative age-related macular degeneration, bilateral, intermediate dry stage: Secondary | ICD-10-CM | POA: Diagnosis not present

## 2017-11-23 DIAGNOSIS — R69 Illness, unspecified: Secondary | ICD-10-CM | POA: Diagnosis not present

## 2017-11-23 DIAGNOSIS — Z0001 Encounter for general adult medical examination with abnormal findings: Secondary | ICD-10-CM | POA: Diagnosis not present

## 2017-11-23 DIAGNOSIS — E782 Mixed hyperlipidemia: Secondary | ICD-10-CM | POA: Diagnosis not present

## 2017-11-23 DIAGNOSIS — C9000 Multiple myeloma not having achieved remission: Secondary | ICD-10-CM | POA: Diagnosis not present

## 2017-11-23 DIAGNOSIS — I10 Essential (primary) hypertension: Secondary | ICD-10-CM | POA: Diagnosis not present

## 2017-11-29 DIAGNOSIS — Z0001 Encounter for general adult medical examination with abnormal findings: Secondary | ICD-10-CM | POA: Diagnosis not present

## 2017-11-30 ENCOUNTER — Other Ambulatory Visit: Payer: Self-pay | Admitting: Nurse Practitioner

## 2017-11-30 DIAGNOSIS — Z1231 Encounter for screening mammogram for malignant neoplasm of breast: Secondary | ICD-10-CM

## 2017-12-03 DIAGNOSIS — H40003 Preglaucoma, unspecified, bilateral: Secondary | ICD-10-CM | POA: Diagnosis not present

## 2017-12-15 DIAGNOSIS — C9001 Multiple myeloma in remission: Secondary | ICD-10-CM | POA: Diagnosis not present

## 2017-12-15 DIAGNOSIS — R5383 Other fatigue: Secondary | ICD-10-CM | POA: Diagnosis not present

## 2017-12-15 DIAGNOSIS — R0609 Other forms of dyspnea: Secondary | ICD-10-CM | POA: Diagnosis not present

## 2017-12-15 DIAGNOSIS — M545 Low back pain: Secondary | ICD-10-CM | POA: Diagnosis not present

## 2017-12-15 DIAGNOSIS — R197 Diarrhea, unspecified: Secondary | ICD-10-CM | POA: Diagnosis not present

## 2017-12-15 DIAGNOSIS — Z5112 Encounter for antineoplastic immunotherapy: Secondary | ICD-10-CM | POA: Diagnosis not present

## 2017-12-15 DIAGNOSIS — T451X5A Adverse effect of antineoplastic and immunosuppressive drugs, initial encounter: Secondary | ICD-10-CM | POA: Diagnosis not present

## 2017-12-15 DIAGNOSIS — C9000 Multiple myeloma not having achieved remission: Secondary | ICD-10-CM | POA: Diagnosis not present

## 2017-12-15 DIAGNOSIS — E041 Nontoxic single thyroid nodule: Secondary | ICD-10-CM | POA: Diagnosis not present

## 2017-12-15 DIAGNOSIS — Z886 Allergy status to analgesic agent status: Secondary | ICD-10-CM | POA: Diagnosis not present

## 2017-12-15 DIAGNOSIS — D701 Agranulocytosis secondary to cancer chemotherapy: Secondary | ICD-10-CM | POA: Diagnosis not present

## 2017-12-15 DIAGNOSIS — R112 Nausea with vomiting, unspecified: Secondary | ICD-10-CM | POA: Diagnosis not present

## 2017-12-15 DIAGNOSIS — Z882 Allergy status to sulfonamides status: Secondary | ICD-10-CM | POA: Diagnosis not present

## 2018-01-11 DIAGNOSIS — T451X5A Adverse effect of antineoplastic and immunosuppressive drugs, initial encounter: Secondary | ICD-10-CM | POA: Diagnosis not present

## 2018-01-11 DIAGNOSIS — M545 Low back pain: Secondary | ICD-10-CM | POA: Diagnosis not present

## 2018-01-11 DIAGNOSIS — E785 Hyperlipidemia, unspecified: Secondary | ICD-10-CM | POA: Diagnosis not present

## 2018-01-11 DIAGNOSIS — I10 Essential (primary) hypertension: Secondary | ICD-10-CM | POA: Diagnosis not present

## 2018-01-11 DIAGNOSIS — C9001 Multiple myeloma in remission: Secondary | ICD-10-CM | POA: Diagnosis not present

## 2018-01-11 DIAGNOSIS — Z5112 Encounter for antineoplastic immunotherapy: Secondary | ICD-10-CM | POA: Diagnosis not present

## 2018-01-11 DIAGNOSIS — D801 Nonfamilial hypogammaglobulinemia: Secondary | ICD-10-CM | POA: Insufficient documentation

## 2018-01-11 DIAGNOSIS — D701 Agranulocytosis secondary to cancer chemotherapy: Secondary | ICD-10-CM | POA: Diagnosis not present

## 2018-01-11 DIAGNOSIS — R110 Nausea: Secondary | ICD-10-CM | POA: Diagnosis not present

## 2018-01-11 DIAGNOSIS — R0609 Other forms of dyspnea: Secondary | ICD-10-CM | POA: Diagnosis not present

## 2018-01-11 DIAGNOSIS — D61810 Antineoplastic chemotherapy induced pancytopenia: Secondary | ICD-10-CM | POA: Diagnosis not present

## 2018-01-11 DIAGNOSIS — E041 Nontoxic single thyroid nodule: Secondary | ICD-10-CM | POA: Diagnosis not present

## 2018-02-03 ENCOUNTER — Ambulatory Visit
Admission: RE | Admit: 2018-02-03 | Discharge: 2018-02-03 | Disposition: A | Discharge status: Home / Self Care | Payer: Medicare HMO | Source: Ambulatory Visit | Attending: Nurse Practitioner | Admitting: Nurse Practitioner

## 2018-02-03 DIAGNOSIS — Z1231 Encounter for screening mammogram for malignant neoplasm of breast: Secondary | ICD-10-CM | POA: Diagnosis not present

## 2018-02-08 DIAGNOSIS — Z882 Allergy status to sulfonamides status: Secondary | ICD-10-CM | POA: Diagnosis not present

## 2018-02-08 DIAGNOSIS — Z886 Allergy status to analgesic agent status: Secondary | ICD-10-CM | POA: Diagnosis not present

## 2018-02-08 DIAGNOSIS — Z885 Allergy status to narcotic agent status: Secondary | ICD-10-CM | POA: Diagnosis not present

## 2018-02-08 DIAGNOSIS — R110 Nausea: Secondary | ICD-10-CM | POA: Diagnosis not present

## 2018-02-08 DIAGNOSIS — E041 Nontoxic single thyroid nodule: Secondary | ICD-10-CM | POA: Diagnosis not present

## 2018-02-08 DIAGNOSIS — Z880 Allergy status to penicillin: Secondary | ICD-10-CM | POA: Diagnosis not present

## 2018-02-08 DIAGNOSIS — C9001 Multiple myeloma in remission: Secondary | ICD-10-CM | POA: Diagnosis not present

## 2018-02-08 DIAGNOSIS — Z5112 Encounter for antineoplastic immunotherapy: Secondary | ICD-10-CM | POA: Diagnosis not present

## 2018-02-08 DIAGNOSIS — D61810 Antineoplastic chemotherapy induced pancytopenia: Secondary | ICD-10-CM | POA: Diagnosis not present

## 2018-02-08 DIAGNOSIS — Z881 Allergy status to other antibiotic agents status: Secondary | ICD-10-CM | POA: Diagnosis not present

## 2018-02-08 DIAGNOSIS — R0609 Other forms of dyspnea: Secondary | ICD-10-CM | POA: Diagnosis not present

## 2018-02-10 IMAGING — MG MM DIGITAL SCREENING BILAT W/ CAD
4 series · 4 of 4 positions shown · non-contrast
Comparison: Previous exam(s).

CLINICAL DATA: Screening.

EXAM:
DIGITAL SCREENING BILATERAL MAMMOGRAM WITH CAD

[L MLO]
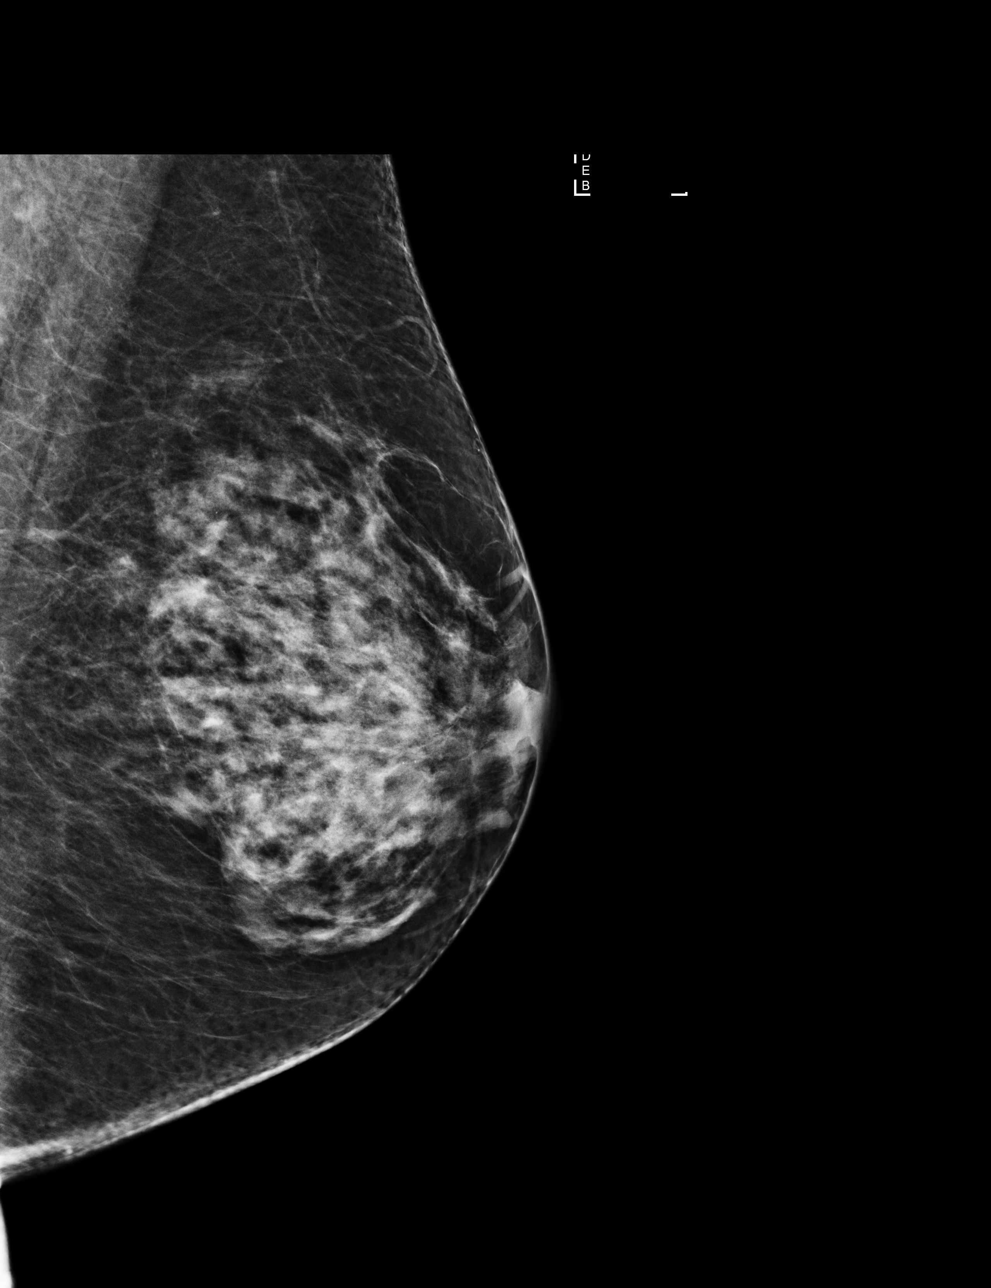

[L CC]
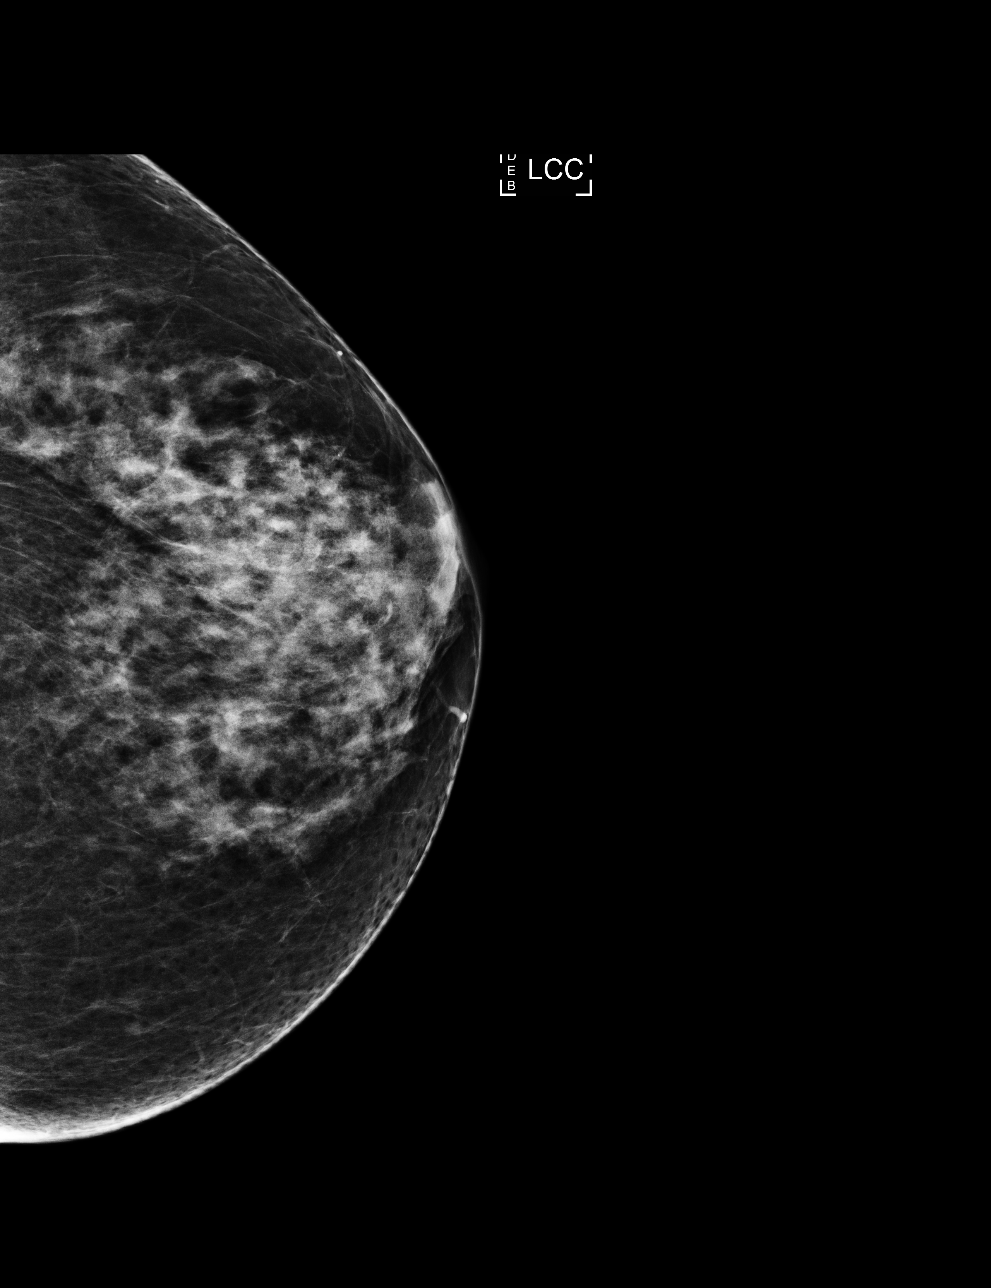

[R CC]
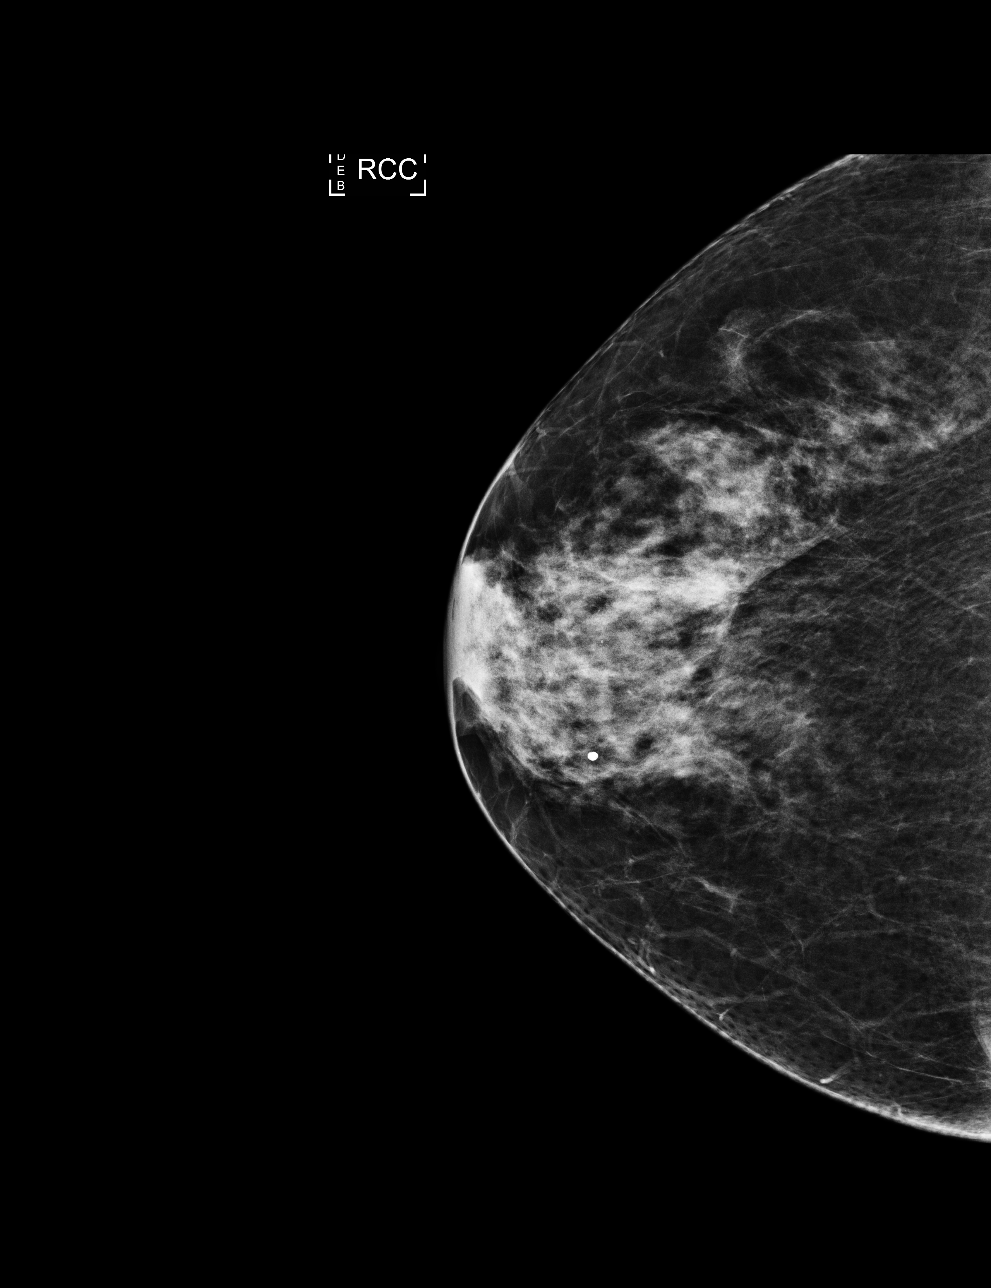

[R MLO]
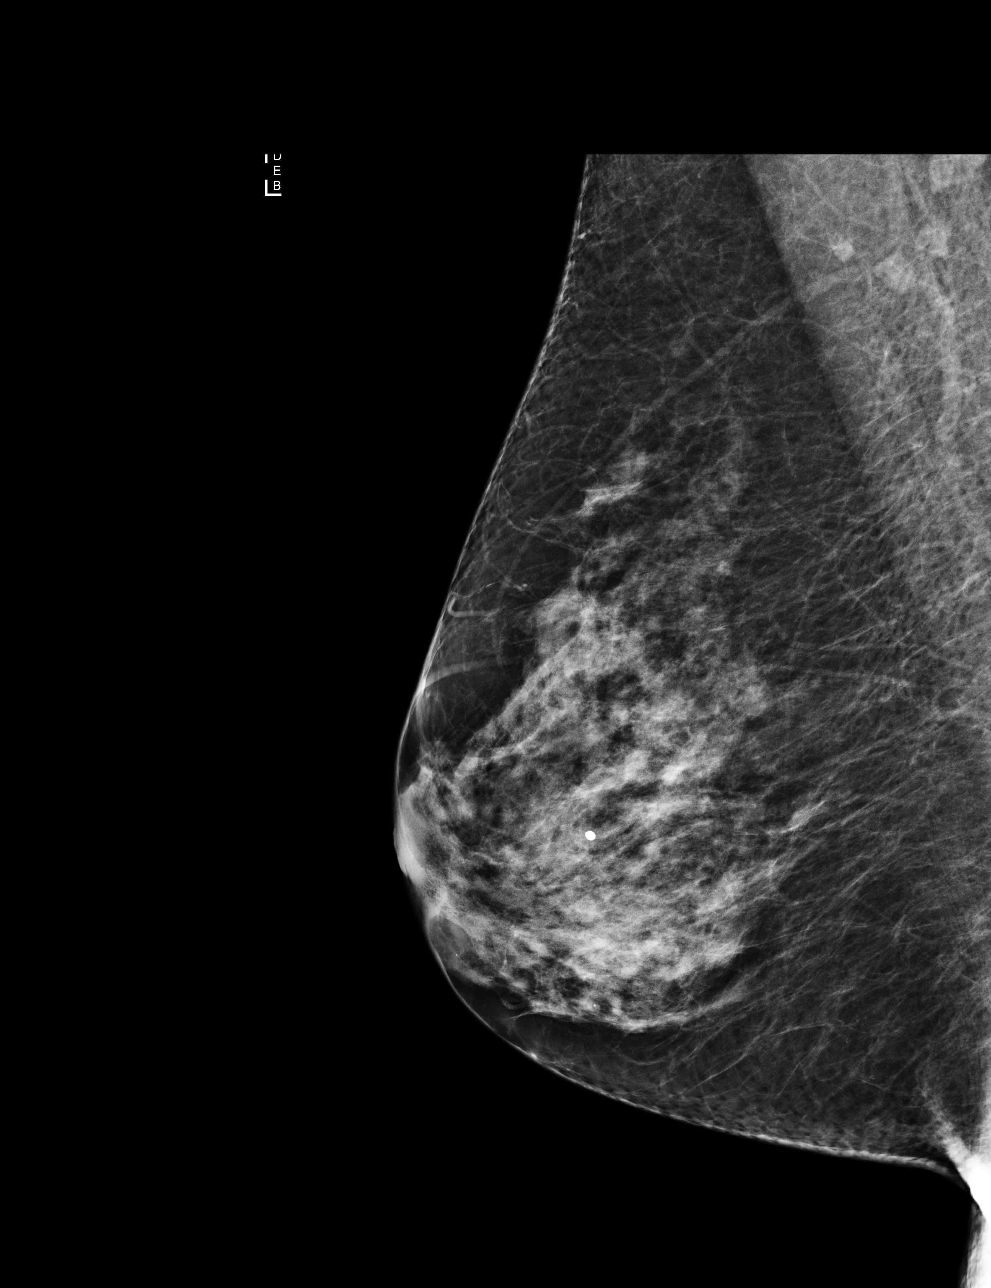

[4 of 4 positions shown; findings below may reference images not displayed]

ACR Breast Density Category c: The breast tissue is heterogeneously
dense, which may obscure small masses.
FINDINGS: There are no findings suspicious for malignancy. Images were
processed with CAD.
IMPRESSION: No mammographic evidence of malignancy. A result letter of this
screening mammogram will be mailed directly to the patient.

RECOMMENDATION:
Screening mammogram in one year. (Code:YJ-2-FEZ)

BI-RADS CATEGORY  1: Negative.

## 2018-02-15 IMAGING — CT CT ABD-PELV W/O CM
2 of 4 series · 16 of 46 positions shown, 18 images · non-contrast
Comparison: None.

CLINICAL DATA: Hematochezia.

EXAM:
CT ABDOMEN AND PELVIS WITHOUT CONTRAST
TECHNIQUE: Multidetector CT imaging of the abdomen and pelvis was performed
following the standard protocol without IV contrast.

[Series 2: routine abd/pel wo · axial · 0.77mm/px · z∈[-475,-85]mm · 13 of 86 slices shown, 15 images]
[im 4/86  soft-tissue]
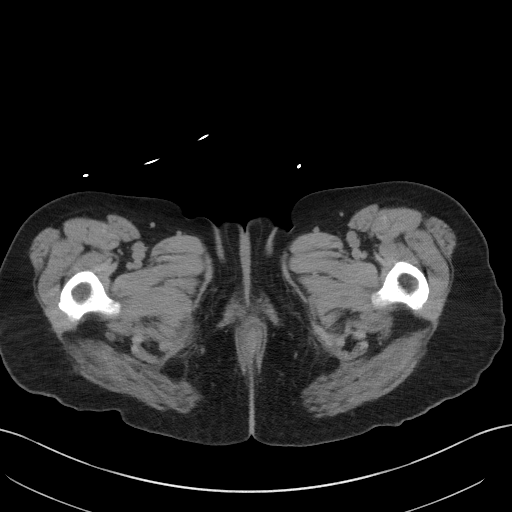
[im 4/86  bone]
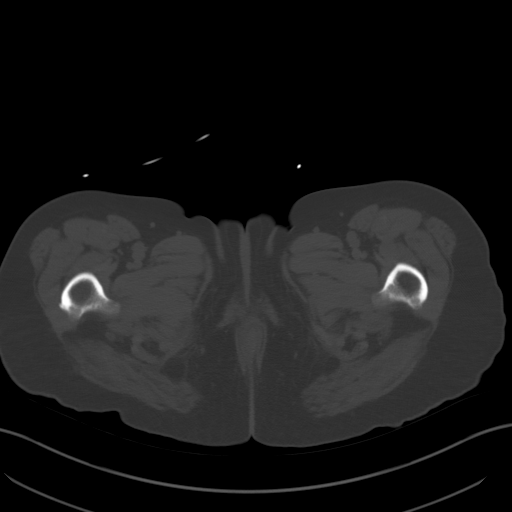
[im 11/86  soft-tissue]
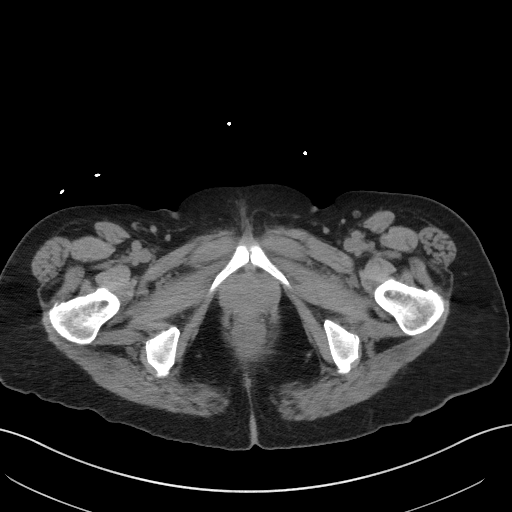
[im 18/86  soft-tissue]
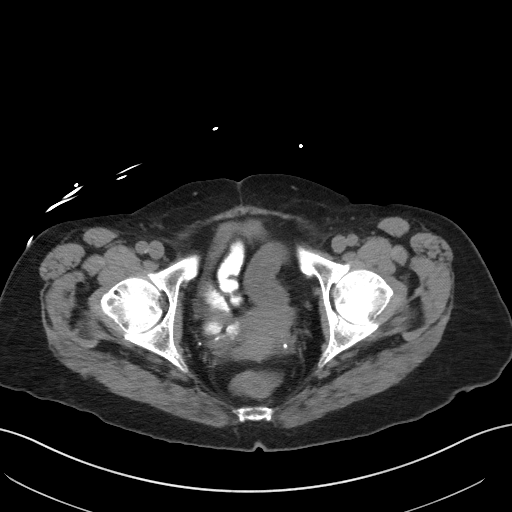
[im 25/86  soft-tissue]
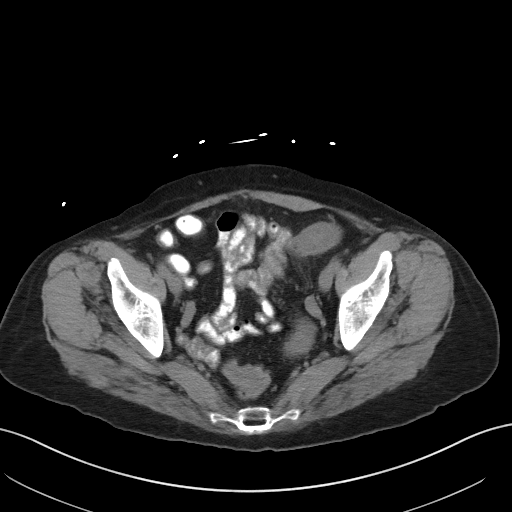
[im 29/86  soft-tissue]
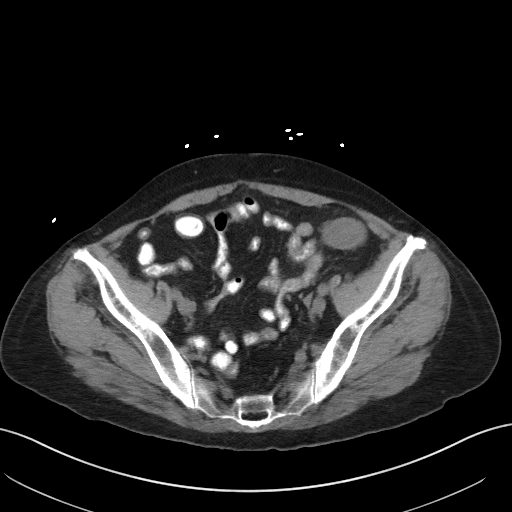
[im 36/86  soft-tissue]
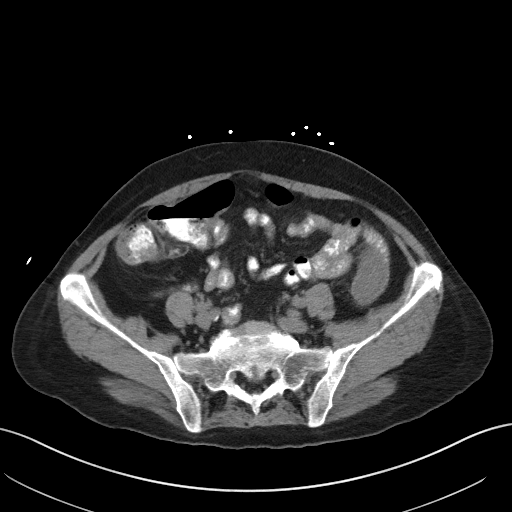
[im 43/86  soft-tissue]
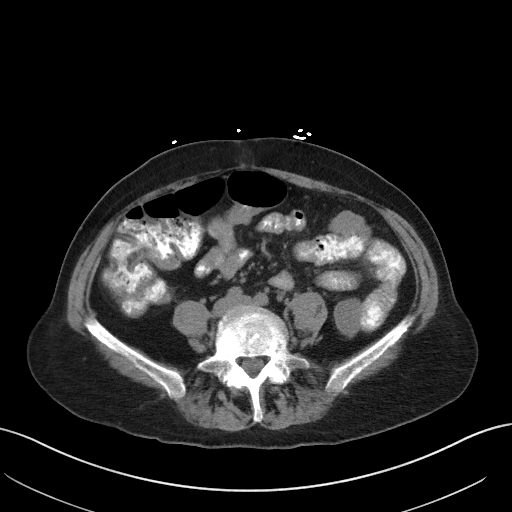
[im 50/86  soft-tissue]
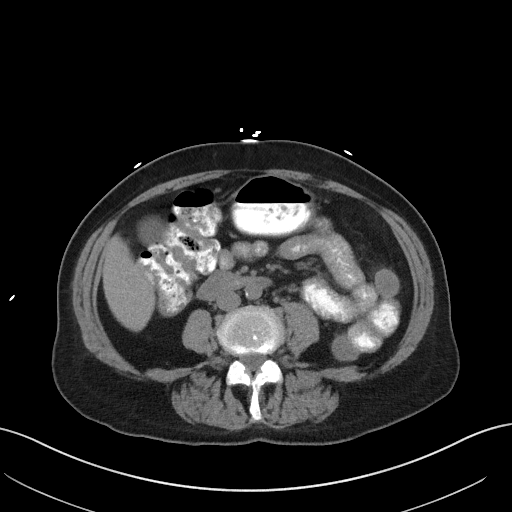
[im 57/86  soft-tissue]
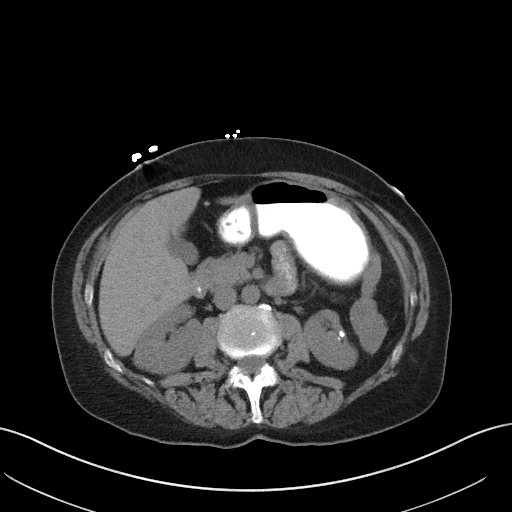
[im 57/86  bone]
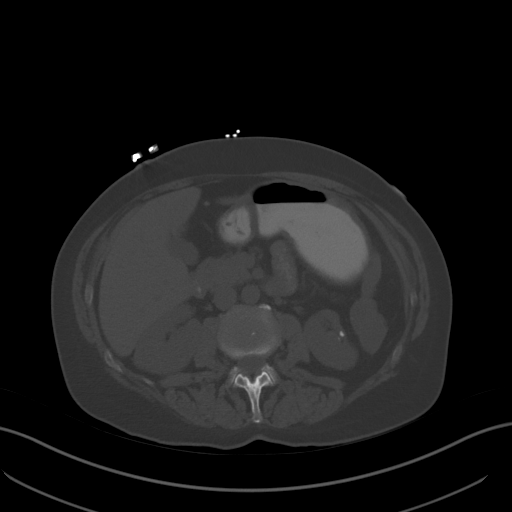
[im 61/86  soft-tissue]
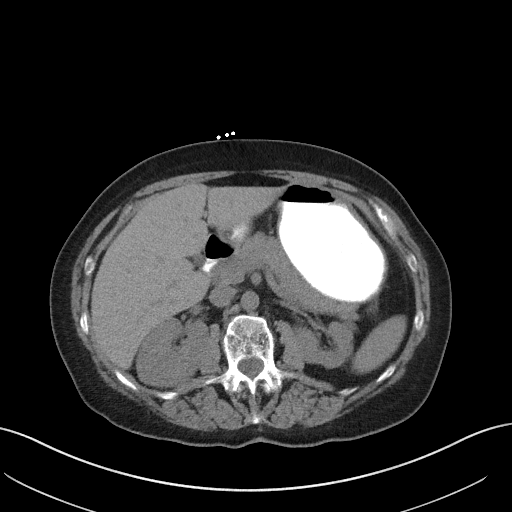
[im 68/86  soft-tissue]
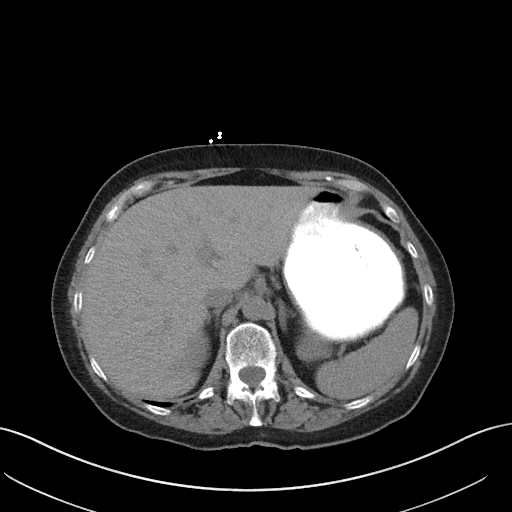
[im 75/86  soft-tissue]
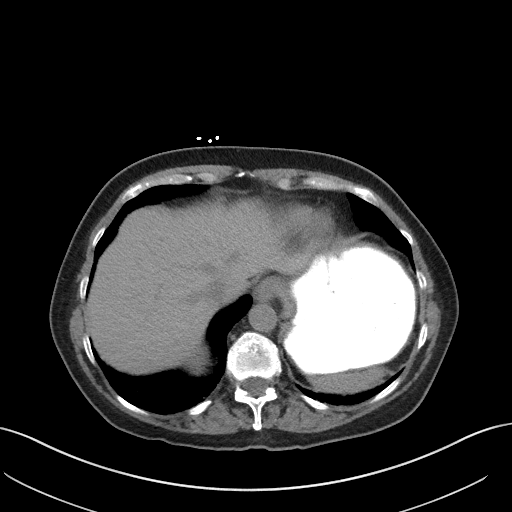
[im 82/86  soft-tissue]
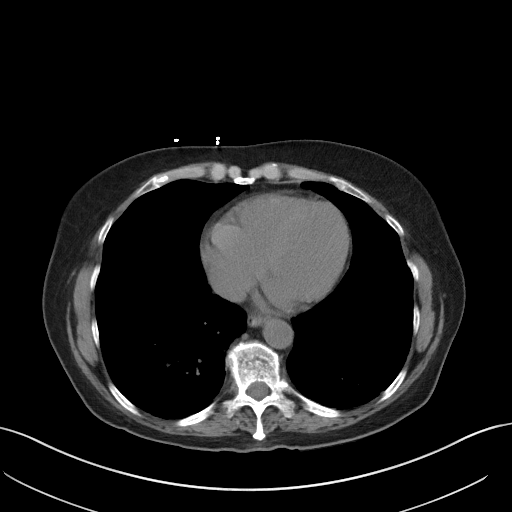

[Series 5: coronal st · coronal · 0.73mm/px · 3 of 82 slices shown]
[im 28/82  soft-tissue]
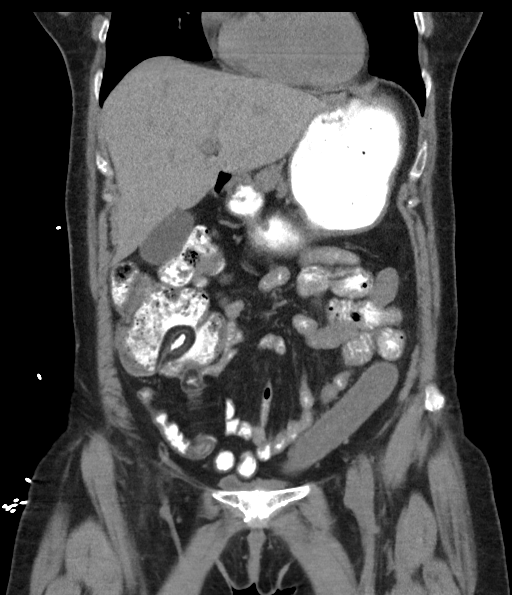
[im 37/82  soft-tissue]
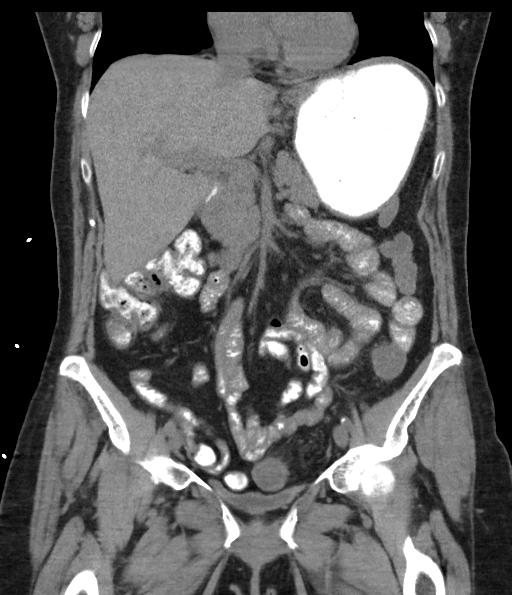
[im 46/82  soft-tissue]
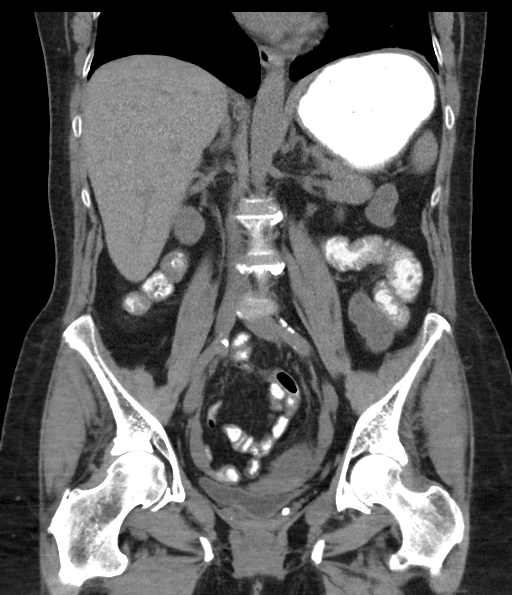

[16 of 46 positions shown; findings below may reference images not displayed]

FINDINGS: Lower chest: No acute abnormality.

Hepatobiliary: Unenhanced appearance of the liver and gallbladder
are unremarkable.

Pancreas: Unremarkable unenhanced appearance.

Spleen: Unremarkable.

Adrenals/Urinary Tract: The left kidney is atrophic relative to the
right. Both kidneys demonstrate areas of dystrophic calcification
involving cortex with cortical scarring. No hydronephrosis.

Stomach/Bowel: No evidence of bowel obstruction. Oral contrast was
administered. Without IV contrast, it is difficult to evaluate
enhancement of the bowel. However, based on appearance of the colon,
there is some suspicion of potential colitis involving the
descending and sigmoid colon. No evidence of significant colonic
diverticular disease, acute diverticulitis, free air or abscess.

Vascular/Lymphatic: No enlarged lymph nodes are identified. The
abdominal aorta shows normal caliber.

Reproductive: Uterus and bilateral adnexa are unremarkable.

Other: No abdominal wall hernia or abnormality. No abdominopelvic
ascites.

Musculoskeletal: No acute or significant osseous findings.
IMPRESSION: 1. Suspect colitis involving the descending and sigmoid colon. No
evidence of bowel perforation, obstruction or diverticular disease.
2. Scarring of both kidneys with dystrophic calcification involving
bilateral renal cortex and relative atrophy of the left kidney
compared to the right.

## 2018-03-01 ENCOUNTER — Encounter: Payer: Self-pay | Admitting: Nurse Practitioner

## 2018-03-01 ENCOUNTER — Ambulatory Visit (INDEPENDENT_AMBULATORY_CARE_PROVIDER_SITE_OTHER): Payer: Medicare HMO | Admitting: Nurse Practitioner

## 2018-03-01 VITALS — BP 128/80 | HR 70 | Temp 98.3°F | Resp 16 | Ht 64.5 in | Wt 134.2 lb

## 2018-03-01 DIAGNOSIS — J209 Acute bronchitis, unspecified: Secondary | ICD-10-CM | POA: Diagnosis not present

## 2018-03-01 DIAGNOSIS — R05 Cough: Secondary | ICD-10-CM

## 2018-03-01 DIAGNOSIS — R6889 Other general symptoms and signs: Secondary | ICD-10-CM | POA: Diagnosis not present

## 2018-03-01 DIAGNOSIS — R059 Cough, unspecified: Secondary | ICD-10-CM | POA: Insufficient documentation

## 2018-03-01 DIAGNOSIS — I10 Essential (primary) hypertension: Secondary | ICD-10-CM

## 2018-03-01 LAB — POCT INFLUENZA A/B
INFLUENZA A, POC: NEGATIVE
INFLUENZA B, POC: NEGATIVE

## 2018-03-01 MED ORDER — AZITHROMYCIN 250 MG PO TABS: ORAL_TABLET | ORAL | 0 refills | Status: DC

## 2018-03-01 NOTE — Progress Notes (Signed)
[facility] [place], [place] [address]  Internal MEDICINE  Office Visit Note  Patient Name: Sherry Oneal  [id number]  [id number]  Date of Service: 03/01/2018  Chief Complaint  Patient presents with  . URI     URI   This is a new problem. The current episode started in the past 7 days. The problem has been gradually worsening. The maximum temperature recorded prior to her arrival was 100.4 - 100.9 F. The fever has been present for less than 1 day. Associated symptoms include congestion, coughing, headaches, nausea, rhinorrhea and a sore throat. Pertinent negatives include no chest pain, rash, vomiting or wheezing. She has tried acetaminophen (OTC cough suppressant) for the symptoms. The treatment provided mild relief.   Pt is here for a sick visit.     Current Medication:  Outpatient Encounter Medications as of 03/01/2018  Medication Sig  . acetaminophen (TYLENOL) 500 MG tablet Take 500 mg by mouth every 4 (four) hours.  . ALPRAZolam (XANAX) 0.25 MG tablet Take 0.25 mg by mouth daily as needed.  Marland Kitchen atorvastatin (LIPITOR) 10 MG tablet Take 10 mg by mouth daily.  . Calcium Carbonate-Vitamin D 600-400 MG-UNIT tablet Take 1 tablet by mouth daily.  . Cranberry 500 MG CAPS Take 500 mg by mouth every morning.  . diphenhydrAMINE (BENADRYL ALLERGY) 25 MG tablet Take 25 mg by mouth daily as needed.  Marland Kitchen lisinopril (PRINIVIL,ZESTRIL) 10 MG tablet Take 10 mg by mouth daily.  . Multiple Vitamins-Minerals (PRESERVISION AREDS PO) Take 1 tablet by mouth every morning.  Marland Kitchen omeprazole (PRILOSEC) 20 MG capsule Take 20 mg by mouth daily.  . ondansetron (ZOFRAN-ODT) 4 MG disintegrating tablet DISSOLVE 1 TABLET IN MOUTH EVERY 8 HOURS AS NEEDED FOR NAUSEA  . polyethylene glycol (MIRALAX / GLYCOLAX) packet Take 17 g by mouth daily as needed.  . pomalidomide (POMALYST) 4 MG capsule Take 1 capsule by mouth once daily for days 1-21. 7 days rest.  . prochlorperazine (COMPAZINE) 5 MG  tablet Take 5 mg by mouth every 8 (eight) hours as needed.  . valACYclovir (VALTREX) 500 MG tablet Take 500 mg by mouth daily.  Marland Kitchen albuterol (PROVENTIL HFA;VENTOLIN HFA) 108 (90 Base) MCG/ACT inhaler Inhale 2 puffs into the lungs every 6 (six) hours as needed for wheezing or shortness of breath. (Patient not taking: Reported on 03/01/2018)  . azithromycin (ZITHROMAX) 250 MG tablet z-pack - take as directed for 5 days  . [DISCONTINUED] Melatonin 3 MG TABS Take 3 mg by mouth at bedtime as needed.   No facility-administered encounter medications on file as of 03/01/2018.       Medical History: Past Medical History:  Diagnosis Date  . Anemia   . High blood pressure   . High cholesterol   . Multiple myeloma ([place])   . Renal insufficiency      Today's Vitals   03/01/18 0936  BP: 128/80  Pulse: 70  Resp: 16  Temp: 98.3 F (36.8 C)  Weight: 134 lb 3.2 oz (60.9 kg)  Height: 5' 4.5" (1.638 m)    Review of Systems  Constitutional: Positive for chills, fatigue and fever.  HENT: Positive for congestion, postnasal drip, rhinorrhea and sore throat.   Eyes: Negative.   Respiratory: Positive for cough. Negative for shortness of breath and wheezing.   Cardiovascular: Negative for chest pain and palpitations.  Gastrointestinal: Positive for nausea. Negative for vomiting.  Endocrine: Negative for cold intolerance, heat intolerance, polydipsia, polyphagia and polyuria.  Musculoskeletal: Negative for  arthralgias.  Skin: Negative for rash.  Allergic/Immunologic: Negative for environmental allergies.  Neurological: Positive for headaches.  Hematological: Positive for adenopathy.  Psychiatric/Behavioral: Negative for behavioral problems. The patient is not nervous/anxious.     Physical Exam  Constitutional: She is oriented to person, place, and time. She appears well-developed and well-nourished. She appears ill. No distress.  HENT:  Head: Normocephalic and atraumatic.  Right Ear: Tympanic  membrane is bulging.  Left Ear: Tympanic membrane is bulging.  Nose: Rhinorrhea present. Right sinus exhibits no maxillary sinus tenderness and no frontal sinus tenderness. Left sinus exhibits no maxillary sinus tenderness and no frontal sinus tenderness.  Mouth/Throat: Posterior oropharyngeal erythema present. No oropharyngeal exudate.  Eyes: EOM are normal. Pupils are equal, round, and reactive to light.  Neck: Normal range of motion. Neck supple. No JVD present. No tracheal deviation present. No thyromegaly present.  Cardiovascular: Normal rate, regular rhythm and normal heart sounds. Exam reveals no gallop and no friction rub.  No murmur heard. Pulmonary/Chest: Effort normal. No respiratory distress. She has no wheezes. She has no rales. She exhibits no tenderness.  Very slight congestion auscultate in left lower lobe of the lungs. Breath sounds are, otherwise, clear. She has harsh, dry cough.  Abdominal: Soft. Bowel sounds are normal. There is no tenderness.  Musculoskeletal: Normal range of motion.  Lymphadenopathy:    She has cervical adenopathy.  Neurological: She is alert and oriented to person, place, and time. No cranial nerve deficit.  Skin: Skin is warm and dry. She is not diaphoretic.  Psychiatric: She has a normal mood and affect. Her behavior is normal. Judgment and thought content normal.  Nursing note and vitals reviewed.  Assessment/Plan:  1. Flu-like symptoms - POCT Influenza A/B negative today  2. Acute bronchitis, unspecified organism - azithromycin (ZITHROMAX) 250 MG tablet; z-pack - take as directed for 5 days  Dispense: 6 tablet; Refill: 0 Rest and increase fluids. Continue to take tylenol as needed and as indicated for body aches and fever.   3. Cough Continue to use previously prescribed cough suppressant as needed and as prescribed.   4. Essential hypertension Generally stable. Continue bp medication as prescribed.    General Counseling: ayat drenning  understanding of the findings of todays visit and agrees with plan of treatment. I have discussed any further diagnostic evaluation that may be needed or ordered today. We also reviewed her medications today. she has been encouraged to call the office with any questions or concerns that should arise related to todays visit.  This patient was seen by Leretha Pol, FNP- C in Collaboration with Dr Lavera Guise as a part of collaborative care agreement    Orders Placed This Encounter  Procedures  . POCT Influenza A/B    Meds ordered this encounter  Medications  . azithromycin (ZITHROMAX) 250 MG tablet    Sig: z-pack - take as directed for 5 days    Dispense:  6 tablet    Refill:  0    Order Specific Question:   Supervising Provider    Answer:   Lavera Guise [[id number]]    Time spent: 15 Minutes

## 2018-03-04 ENCOUNTER — Telehealth: Payer: Self-pay

## 2018-03-04 ENCOUNTER — Other Ambulatory Visit: Payer: Self-pay | Admitting: Nurse Practitioner

## 2018-03-04 DIAGNOSIS — J209 Acute bronchitis, unspecified: Secondary | ICD-10-CM

## 2018-03-04 DIAGNOSIS — R69 Illness, unspecified: Secondary | ICD-10-CM | POA: Diagnosis not present

## 2018-03-04 DIAGNOSIS — R05 Cough: Secondary | ICD-10-CM

## 2018-03-04 DIAGNOSIS — R059 Cough, unspecified: Secondary | ICD-10-CM

## 2018-03-04 MED ORDER — IPRATROPIUM-ALBUTEROL 0.5-2.5 (3) MG/3ML IN SOLN: 3.0000 mL | Freq: Four times a day (QID) | RESPIRATORY_TRACT | 3 refills | Status: DC | PRN

## 2018-03-04 MED ORDER — LEVOFLOXACIN 500 MG PO TABS: 500.0000 mg | ORAL_TABLET | Freq: Every day | ORAL | 0 refills | Status: DC

## 2018-03-04 MED ORDER — PREDNISONE 10 MG (21) PO TBPK: ORAL_TABLET | ORAL | 0 refills | Status: DC

## 2018-03-04 NOTE — Progress Notes (Signed)
Patient still feeling poorly. Changed abx to levaquin 500mg  daily for 10 days. Added prednisone 6 day taper, and duoneb to use 4 times daily as needed for cough and SOB. All sent to [facility].

## 2018-03-04 NOTE — Telephone Encounter (Signed)
Spoke to pt's husband and advised that new abx and other meds was sent to [facility].  dbs

## 2018-03-15 ENCOUNTER — Ambulatory Visit (INDEPENDENT_AMBULATORY_CARE_PROVIDER_SITE_OTHER): Payer: Medicare HMO | Admitting: Nurse Practitioner

## 2018-03-15 ENCOUNTER — Ambulatory Visit
Admission: RE | Admit: 2018-03-15 | Discharge: 2018-03-15 | Disposition: A | Discharge status: Home / Self Care | Payer: Medicare HMO | Source: Ambulatory Visit | Attending: Nurse Practitioner | Admitting: Nurse Practitioner

## 2018-03-15 ENCOUNTER — Encounter: Payer: Self-pay | Admitting: Nurse Practitioner

## 2018-03-15 VITALS — BP 103/71 | HR 88 | Ht 64.5 in | Wt 133.0 lb

## 2018-03-15 DIAGNOSIS — J209 Acute bronchitis, unspecified: Secondary | ICD-10-CM | POA: Diagnosis not present

## 2018-03-15 DIAGNOSIS — R918 Other nonspecific abnormal finding of lung field: Secondary | ICD-10-CM | POA: Diagnosis not present

## 2018-03-15 DIAGNOSIS — R05 Cough: Secondary | ICD-10-CM | POA: Diagnosis not present

## 2018-03-15 DIAGNOSIS — R059 Cough, unspecified: Secondary | ICD-10-CM

## 2018-03-15 DIAGNOSIS — C9000 Multiple myeloma not having achieved remission: Secondary | ICD-10-CM | POA: Diagnosis not present

## 2018-03-15 MED ORDER — LEVOFLOXACIN 500 MG PO TABS: 500.0000 mg | ORAL_TABLET | Freq: Every day | ORAL | 0 refills | Status: DC

## 2018-03-15 NOTE — Progress Notes (Signed)
[facility] [place], [place] [address]  Internal MEDICINE  Office Visit Note  Patient Name: Sherry Oneal  [id number]  [id number]  Date of Service: 03/17/2018  Chief Complaint  Patient presents with  . Cough    going on for few weeks   . Sinusitis  . Ear Pain    both     The patient is here for sick visit. She continues to cough and wheeze. Initially treated on 3/4 with z-pack. Did not improve. Did 10 day round of levofloxacin. Started her with nebulizer treatments four times daily when needed for coughing and wheezing. She states that she has noted some imrpovement, however, she feels like she cannot get over this completely. She is supposed to get chemotherapy treatment tomorrow. Had to miss her last round due to this illness. She will have blood work done tomorrow and see her oncologist regardless of chemotherapy.    Pt is here for a sick visit.     Current Medication:  Outpatient Encounter Medications as of 03/15/2018  Medication Sig  . acetaminophen (TYLENOL) 500 MG tablet Take 500 mg by mouth every 4 (four) hours.  Marland Kitchen albuterol (PROVENTIL HFA;VENTOLIN HFA) 108 (90 Base) MCG/ACT inhaler Inhale 2 puffs into the lungs every 6 (six) hours as needed for wheezing or shortness of breath. (Patient not taking: Reported on 03/01/2018)  . ALPRAZolam (XANAX) 0.25 MG tablet Take 0.25 mg by mouth daily as needed.  Marland Kitchen atorvastatin (LIPITOR) 10 MG tablet Take 10 mg by mouth daily.  . Calcium Carbonate-Vitamin D 600-400 MG-UNIT tablet Take 1 tablet by mouth daily.  . Cranberry 500 MG CAPS Take 500 mg by mouth every morning.  . diphenhydrAMINE (BENADRYL ALLERGY) 25 MG tablet Take 25 mg by mouth daily as needed.  Marland Kitchen ipratropium-albuterol (DUONEB) 0.5-2.5 (3) MG/3ML SOLN Take 3 mLs by nebulization every 6 (six) hours as needed.  Marland Kitchen levofloxacin (LEVAQUIN) 500 MG tablet Take 1 tablet (500 mg total) by mouth daily.  Marland Kitchen lisinopril (PRINIVIL,ZESTRIL) 10 MG tablet Take 10 mg  by mouth daily.  . Multiple Vitamins-Minerals (PRESERVISION AREDS PO) Take 1 tablet by mouth every morning.  Marland Kitchen omeprazole (PRILOSEC) 20 MG capsule Take 20 mg by mouth daily.  . ondansetron (ZOFRAN-ODT) 4 MG disintegrating tablet DISSOLVE 1 TABLET IN MOUTH EVERY 8 HOURS AS NEEDED FOR NAUSEA  . polyethylene glycol (MIRALAX / GLYCOLAX) packet Take 17 g by mouth daily as needed.  . pomalidomide (POMALYST) 4 MG capsule Take 1 capsule by mouth once daily for days 1-21. 7 days rest.  . predniSONE (STERAPRED UNI-PAK 21 TAB) 10 MG (21) TBPK tablet 6 day taper - take by mouth as directed for 6 days (Patient not taking: Reported on 03/15/2018)  . prochlorperazine (COMPAZINE) 5 MG tablet Take 5 mg by mouth every 8 (eight) hours as needed.  . valACYclovir (VALTREX) 500 MG tablet Take 500 mg by mouth daily.  . [DISCONTINUED] levofloxacin (LEVAQUIN) 500 MG tablet Take 1 tablet (500 mg total) by mouth daily. (Patient not taking: Reported on 03/15/2018)   No facility-administered encounter medications on file as of 03/15/2018.       Medical History: Past Medical History:  Diagnosis Date  . Anemia   . High blood pressure   . High cholesterol   . Multiple myeloma ([place])   . Renal insufficiency      Today's Vitals   03/15/18 1420  BP: 103/71  Pulse: 88  SpO2: 98%  Weight: 133 lb (60.3 kg)  Height:  5' 4.5" (1.638 m)    Review of Systems  Constitutional: Positive for chills, fatigue and fever.  HENT: Positive for congestion, postnasal drip, rhinorrhea and sore throat.   Eyes: Negative.   Respiratory: Positive for cough and wheezing. Negative for shortness of breath.   Cardiovascular: Negative for chest pain and palpitations.  Gastrointestinal: Positive for nausea. Negative for vomiting.  Endocrine: Negative for cold intolerance, heat intolerance, polydipsia, polyphagia and polyuria.  Musculoskeletal: Negative for arthralgias.  Skin: Negative for rash.  Allergic/Immunologic: Negative for  environmental allergies.  Neurological: Positive for headaches.  Hematological: Positive for adenopathy.  Psychiatric/Behavioral: Negative for behavioral problems. The patient is not nervous/anxious.     Physical Exam  Constitutional: She is oriented to person, place, and time. She appears well-developed and well-nourished. She appears ill. No distress.  HENT:  Head: Normocephalic and atraumatic.  Right Ear: Tympanic membrane is bulging.  Left Ear: Tympanic membrane is bulging.  Nose: Rhinorrhea present. Right sinus exhibits no maxillary sinus tenderness and no frontal sinus tenderness. Left sinus exhibits no maxillary sinus tenderness and no frontal sinus tenderness.  Mouth/Throat: Posterior oropharyngeal erythema present. No oropharyngeal exudate.  Eyes: Pupils are equal, round, and reactive to light. EOM are normal.  Neck: Normal range of motion. Neck supple. No JVD present. No tracheal deviation present. No thyromegaly present.  Cardiovascular: Normal rate, regular rhythm and normal heart sounds. Exam reveals no gallop and no friction rub.  No murmur heard. Pulmonary/Chest: Effort normal. No respiratory distress. She has no wheezes. She has no rales. She exhibits no tenderness.  Very slight congestion auscultate in left lower lobe of the lungs. Breath sounds are, otherwise, clear. She has harsh, dry cough. Symptoms have improved some since initial visit, but they are not resolved.   Abdominal: Soft. Bowel sounds are normal. There is no tenderness.  Musculoskeletal: Normal range of motion.  Lymphadenopathy:    She has cervical adenopathy.  Neurological: She is alert and oriented to person, place, and time. No cranial nerve deficit.  Skin: Skin is warm and dry. She is not diaphoretic.  Psychiatric: She has a normal mood and affect. Her behavior is normal. Judgment and thought content normal.  Nursing note and vitals reviewed.   Assessment/Plan: 1. Acute bronchitis, unspecified  organism Repeat treatment with levofloxacin 598m daily for 10 days. Will get chest x-ray for further evaluation.  - levofloxacin (LEVAQUIN) 500 MG tablet; Take 1 tablet (500 mg total) by mouth daily.  Dispense: 10 tablet; Refill: 0  2. Cough - DG Chest 2 View; Future  3. [place], [place] Upcoming visit for labs, oncology visit, and possible chemotherapy. Keep appointment as scheduled.  General Counseling: Lmckenlee manghamunderstanding of the findings of todays visit and agrees with plan of treatment. I have discussed any further diagnostic evaluation that may be needed or ordered today. We also reviewed her medications today. she has been encouraged to call the office with any questions or concerns that should arise related to todays visit.  This patient was seen by HLeretha Pol FNP- C in Collaboration with Dr FLavera Guiseas a part of collaborative care agreement    Orders Placed This Encounter  Procedures  . DG Chest 2 View    Meds ordered this encounter  Medications  . levofloxacin (LEVAQUIN) 500 MG tablet    Sig: Take 1 tablet (500 mg total) by mouth daily.    Dispense:  10 tablet    Refill:  0    Order  Specific Question:   Supervising Provider    Answer:   Lavera Guise [[id number]]    Time spent: 15 Minutes

## 2018-03-15 NOTE — Progress Notes (Signed)
Please let the patient know that her chest x-ray is showing no evidence of pneumonia. thanks

## 2018-03-16 DIAGNOSIS — Z880 Allergy status to penicillin: Secondary | ICD-10-CM | POA: Diagnosis not present

## 2018-03-16 DIAGNOSIS — Z882 Allergy status to sulfonamides status: Secondary | ICD-10-CM | POA: Diagnosis not present

## 2018-03-16 DIAGNOSIS — C9001 Multiple myeloma in remission: Secondary | ICD-10-CM | POA: Diagnosis not present

## 2018-03-16 DIAGNOSIS — R110 Nausea: Secondary | ICD-10-CM | POA: Diagnosis not present

## 2018-03-16 DIAGNOSIS — J40 Bronchitis, not specified as acute or chronic: Secondary | ICD-10-CM | POA: Diagnosis not present

## 2018-03-16 DIAGNOSIS — D61810 Antineoplastic chemotherapy induced pancytopenia: Secondary | ICD-10-CM | POA: Diagnosis not present

## 2018-03-16 DIAGNOSIS — D61818 Other pancytopenia: Secondary | ICD-10-CM | POA: Diagnosis not present

## 2018-03-16 DIAGNOSIS — D801 Nonfamilial hypogammaglobulinemia: Secondary | ICD-10-CM | POA: Diagnosis not present

## 2018-03-16 DIAGNOSIS — E041 Nontoxic single thyroid nodule: Secondary | ICD-10-CM | POA: Diagnosis not present

## 2018-03-16 DIAGNOSIS — R0609 Other forms of dyspnea: Secondary | ICD-10-CM | POA: Diagnosis not present

## 2018-03-16 DIAGNOSIS — Z5112 Encounter for antineoplastic immunotherapy: Secondary | ICD-10-CM | POA: Diagnosis not present

## 2018-03-16 NOTE — Progress Notes (Signed)
Told pt husband that her ct scan showed no sign of pneumonia.

## 2018-03-21 ENCOUNTER — Telehealth: Payer: Self-pay

## 2018-03-21 NOTE — Telephone Encounter (Signed)
Can you find out from the patient is she felt better before she had her chemo treatment? I think blood work should also be done. Last blood work I see is from 02/15/2018 at [facility]. Did they do repeat blood work last week?

## 2018-03-21 NOTE — Telephone Encounter (Signed)
Pt said that she did feel better before her chemo treatment and the last time she got her blood work done was last Wednesday on 03/16/18. I was not able to see her blood work from 02/15/18, but she said that everything looked normal.

## 2018-03-21 NOTE — Telephone Encounter (Signed)
AS PER DR Humphrey Rolls ADVISED PT MAY BE IS BLOOD INFECTION CALLED CHEMO DOCTOR OR GO TO ER IF SHE IS RUNNING FEVER

## 2018-03-22 ENCOUNTER — Inpatient Hospital Stay
Admission: EM | Admit: 2018-03-22 | Discharge: 2018-03-24 | DRG: 194 | Disposition: A | Discharge status: Home / Self Care | Payer: Medicare HMO | Attending: Internal Medicine | Admitting: Internal Medicine

## 2018-03-22 ENCOUNTER — Emergency Department: Payer: Medicare HMO

## 2018-03-22 ENCOUNTER — Other Ambulatory Visit: Payer: Self-pay

## 2018-03-22 ENCOUNTER — Encounter: Payer: Self-pay | Admitting: Emergency Medicine

## 2018-03-22 DIAGNOSIS — Z888 Allergy status to other drugs, medicaments and biological substances status: Secondary | ICD-10-CM | POA: Diagnosis not present

## 2018-03-22 DIAGNOSIS — N183 Chronic kidney disease, stage 3 (moderate): Secondary | ICD-10-CM | POA: Diagnosis present

## 2018-03-22 DIAGNOSIS — Y95 Nosocomial condition: Secondary | ICD-10-CM | POA: Diagnosis present

## 2018-03-22 DIAGNOSIS — Z882 Allergy status to sulfonamides status: Secondary | ICD-10-CM | POA: Diagnosis not present

## 2018-03-22 DIAGNOSIS — C9000 Multiple myeloma not having achieved remission: Secondary | ICD-10-CM | POA: Diagnosis not present

## 2018-03-22 DIAGNOSIS — I129 Hypertensive chronic kidney disease with stage 1 through stage 4 chronic kidney disease, or unspecified chronic kidney disease: Secondary | ICD-10-CM | POA: Diagnosis present

## 2018-03-22 DIAGNOSIS — C9001 Multiple myeloma in remission: Secondary | ICD-10-CM | POA: Diagnosis present

## 2018-03-22 DIAGNOSIS — E7800 Pure hypercholesterolemia, unspecified: Secondary | ICD-10-CM | POA: Diagnosis not present

## 2018-03-22 DIAGNOSIS — Z79899 Other long term (current) drug therapy: Secondary | ICD-10-CM

## 2018-03-22 DIAGNOSIS — R05 Cough: Secondary | ICD-10-CM

## 2018-03-22 DIAGNOSIS — J181 Lobar pneumonia, unspecified organism: Principal | ICD-10-CM | POA: Diagnosis present

## 2018-03-22 DIAGNOSIS — Z885 Allergy status to narcotic agent status: Secondary | ICD-10-CM

## 2018-03-22 DIAGNOSIS — Z8249 Family history of ischemic heart disease and other diseases of the circulatory system: Secondary | ICD-10-CM | POA: Diagnosis not present

## 2018-03-22 DIAGNOSIS — J189 Pneumonia, unspecified organism: Secondary | ICD-10-CM | POA: Diagnosis present

## 2018-03-22 DIAGNOSIS — Z880 Allergy status to penicillin: Secondary | ICD-10-CM | POA: Diagnosis not present

## 2018-03-22 DIAGNOSIS — J209 Acute bronchitis, unspecified: Secondary | ICD-10-CM

## 2018-03-22 DIAGNOSIS — Z803 Family history of malignant neoplasm of breast: Secondary | ICD-10-CM

## 2018-03-22 DIAGNOSIS — I10 Essential (primary) hypertension: Secondary | ICD-10-CM | POA: Diagnosis not present

## 2018-03-22 DIAGNOSIS — Z881 Allergy status to other antibiotic agents status: Secondary | ICD-10-CM

## 2018-03-22 DIAGNOSIS — Z9225 Personal history of immunosupression therapy: Secondary | ICD-10-CM | POA: Diagnosis not present

## 2018-03-22 DIAGNOSIS — D8989 Other specified disorders involving the immune mechanism, not elsewhere classified: Secondary | ICD-10-CM | POA: Diagnosis not present

## 2018-03-22 DIAGNOSIS — D631 Anemia in chronic kidney disease: Secondary | ICD-10-CM | POA: Diagnosis not present

## 2018-03-22 DIAGNOSIS — D899 Disorder involving the immune mechanism, unspecified: Secondary | ICD-10-CM

## 2018-03-22 DIAGNOSIS — Z823 Family history of stroke: Secondary | ICD-10-CM

## 2018-03-22 DIAGNOSIS — Z91041 Radiographic dye allergy status: Secondary | ICD-10-CM

## 2018-03-22 DIAGNOSIS — Z886 Allergy status to analgesic agent status: Secondary | ICD-10-CM | POA: Diagnosis not present

## 2018-03-22 DIAGNOSIS — R059 Cough, unspecified: Secondary | ICD-10-CM

## 2018-03-22 DIAGNOSIS — D849 Immunodeficiency, unspecified: Secondary | ICD-10-CM

## 2018-03-22 LAB — CBC
HEMATOCRIT: 38.8 % (ref 35.0–47.0)
Hemoglobin: 13 g/dL (ref 12.0–16.0)
MCH: 31.1 pg (ref 26.0–34.0)
MCHC: 33.5 g/dL (ref 32.0–36.0)
MCV: 92.7 fL (ref 80.0–100.0)
Platelets: 180 10*3/uL (ref 150–440)
RBC: 4.19 MIL/uL (ref 3.80–5.20)
RDW: 15.7 % — AB (ref 11.5–14.5)
WBC: 3.8 10*3/uL (ref 3.6–11.0)

## 2018-03-22 LAB — PROCALCITONIN: PROCALCITONIN: 0.11 ng/mL

## 2018-03-22 LAB — BASIC METABOLIC PANEL
Anion gap: 10 (ref 5–15)
BUN: 15 mg/dL (ref 6–20)
CO2: 20 mmol/L — ABNORMAL LOW (ref 22–32)
Calcium: 8.3 mg/dL — ABNORMAL LOW (ref 8.9–10.3)
Chloride: 104 mmol/L (ref 101–111)
Creatinine, Ser: 1.3 mg/dL — ABNORMAL HIGH (ref 0.44–1.00)
GFR calc Af Amer: 47 mL/min — ABNORMAL LOW (ref >60)
GFR, EST NON AFRICAN AMERICAN: 40 mL/min — AB (ref >60)
Glucose, Bld: 102 mg/dL — ABNORMAL HIGH (ref 65–99)
POTASSIUM: 3.9 mmol/L (ref 3.5–5.1)
SODIUM: 134 mmol/L — AB (ref 135–145)

## 2018-03-22 LAB — DIFFERENTIAL
BASOS ABS: 0.1 10*3/uL (ref 0–0.1)
BASOS PCT: 2 %
EOS ABS: 0 10*3/uL (ref 0–0.7)
Eosinophils Relative: 1 %
LYMPHS ABS: 0.4 10*3/uL — AB (ref 1.0–3.6)
Lymphocytes Relative: 11 %
Monocytes Absolute: 0.3 10*3/uL (ref 0.2–0.9)
Monocytes Relative: 8 %
NEUTROS PCT: 78 %
Neutro Abs: 2.9 10*3/uL (ref 1.4–6.5)

## 2018-03-22 LAB — LACTIC ACID, PLASMA: LACTIC ACID, VENOUS: 1.2 mmol/L (ref 0.5–1.9)

## 2018-03-22 MED ORDER — OCUVITE-LUTEIN PO CAPS
1.0000 | ORAL_CAPSULE | Freq: Every morning | ORAL | Status: DC
  Filled 2018-03-22: qty 1

## 2018-03-22 MED ORDER — BENZONATATE 100 MG PO CAPS
200.0000 mg | ORAL_CAPSULE | Freq: Three times a day (TID) | ORAL | Status: DC
  Administered 2018-03-22 – 2018-03-24 (×5): 200 mg via ORAL
  Filled 2018-03-22 (×5): qty 2

## 2018-03-22 MED ORDER — VALACYCLOVIR HCL 500 MG PO TABS
500.0000 mg | ORAL_TABLET | Freq: Every day | ORAL | Status: DC
  Administered 2018-03-23 – 2018-03-24 (×2): 500 mg via ORAL
  Filled 2018-03-22 (×2): qty 1

## 2018-03-22 MED ORDER — SODIUM CHLORIDE 0.9 % IV SOLN
2.0000 g | Freq: Two times a day (BID) | INTRAVENOUS | Status: DC
  Administered 2018-03-23 – 2018-03-24 (×3): 2 g via INTRAVENOUS
  Filled 2018-03-22 (×4): qty 2

## 2018-03-22 MED ORDER — CALCIUM CARBONATE-VITAMIN D 500-200 MG-UNIT PO TABS
1.0000 | ORAL_TABLET | Freq: Every day | ORAL | Status: DC
  Filled 2018-03-22: qty 1

## 2018-03-22 MED ORDER — ACETAMINOPHEN 325 MG PO TABS
650.0000 mg | ORAL_TABLET | Freq: Four times a day (QID) | ORAL | Status: DC | PRN
  Administered 2018-03-22 – 2018-03-23 (×2): 650 mg via ORAL
  Filled 2018-03-22 (×2): qty 2

## 2018-03-22 MED ORDER — ALPRAZOLAM 0.25 MG PO TABS: 0.2500 mg | ORAL_TABLET | Freq: Two times a day (BID) | ORAL | Status: DC | PRN

## 2018-03-22 MED ORDER — APIXABAN 5 MG PO TABS
5.0000 mg | ORAL_TABLET | Freq: Two times a day (BID) | ORAL | Status: DC
  Administered 2018-03-22 – 2018-03-24 (×4): 5 mg via ORAL
  Filled 2018-03-22 (×4): qty 1

## 2018-03-22 MED ORDER — ONDANSETRON HCL 4 MG/2ML IJ SOLN: 4.0000 mg | Freq: Four times a day (QID) | INTRAMUSCULAR | Status: DC | PRN

## 2018-03-22 MED ORDER — SODIUM CHLORIDE 0.9 % IV SOLN
1.0000 g | Freq: Once | INTRAVENOUS | Status: AC
  Administered 2018-03-22: 1 g via INTRAVENOUS
  Filled 2018-03-22: qty 1

## 2018-03-22 MED ORDER — ATORVASTATIN CALCIUM 20 MG PO TABS
10.0000 mg | ORAL_TABLET | Freq: Every day | ORAL | Status: DC
  Administered 2018-03-23 – 2018-03-24 (×2): 10 mg via ORAL
  Filled 2018-03-22 (×2): qty 1

## 2018-03-22 MED ORDER — VANCOMYCIN HCL IN DEXTROSE 750-5 MG/150ML-% IV SOLN
750.0000 mg | INTRAVENOUS | Status: DC
  Filled 2018-03-22: qty 150

## 2018-03-22 MED ORDER — SODIUM CHLORIDE 0.9 % IV SOLN
INTRAVENOUS | Status: DC
  Administered 2018-03-22: 22:00:00 via INTRAVENOUS

## 2018-03-22 MED ORDER — IPRATROPIUM-ALBUTEROL 0.5-2.5 (3) MG/3ML IN SOLN
3.0000 mL | Freq: Four times a day (QID) | RESPIRATORY_TRACT | Status: DC | PRN
Start: 2018-03-22 — End: 2018-03-23

## 2018-03-22 MED ORDER — LISINOPRIL 10 MG PO TABS
10.0000 mg | ORAL_TABLET | Freq: Every day | ORAL | Status: DC
  Administered 2018-03-23 – 2018-03-24 (×2): 10 mg via ORAL
  Filled 2018-03-22 (×2): qty 1

## 2018-03-22 MED ORDER — HYDROCOD POLST-CPM POLST ER 10-8 MG/5ML PO SUER: 5.0000 mL | Freq: Two times a day (BID) | ORAL | Status: DC

## 2018-03-22 MED ORDER — ONDANSETRON HCL 4 MG PO TABS: 4.0000 mg | ORAL_TABLET | Freq: Four times a day (QID) | ORAL | Status: DC | PRN

## 2018-03-22 MED ORDER — VANCOMYCIN HCL IN DEXTROSE 1-5 GM/200ML-% IV SOLN
1000.0000 mg | Freq: Once | INTRAVENOUS | Status: AC
  Administered 2018-03-22: 1000 mg via INTRAVENOUS
  Filled 2018-03-22: qty 200

## 2018-03-22 MED ORDER — ACETAMINOPHEN 650 MG RE SUPP: 650.0000 mg | Freq: Four times a day (QID) | RECTAL | Status: DC | PRN

## 2018-03-22 MED ORDER — PANTOPRAZOLE SODIUM 40 MG PO TBEC
40.0000 mg | DELAYED_RELEASE_TABLET | Freq: Every day | ORAL | Status: DC
  Administered 2018-03-23 – 2018-03-24 (×2): 40 mg via ORAL
  Filled 2018-03-22 (×2): qty 1

## 2018-03-22 MED ORDER — POLYETHYLENE GLYCOL 3350 17 G PO PACK
17.0000 g | PACK | Freq: Every day | ORAL | Status: DC | PRN
Start: 2018-03-22 — End: 2018-03-24

## 2018-03-22 MED ORDER — SODIUM CHLORIDE 0.9 % IV SOLN
1.0000 g | Freq: Once | INTRAVENOUS | Status: AC
  Administered 2018-03-22: 1 g via INTRAVENOUS
  Filled 2018-03-22 (×2): qty 1

## 2018-03-22 NOTE — ED Provider Notes (Signed)
[facility] Emergency Department Provider Note  ____________________________________________  Time seen: Approximately 6:54 PM  I have reviewed the triage vital signs and the nursing notes.   HISTORY  Chief Complaint Cough   HPI Sherry Oneal is a 72 y.o. female the history of multiple myeloma currently on chemotherapy who presents for evaluation of cough and generalized weakness.  Patient has had cough and URI symptoms for 3 weeks.  Has been on a Z-Pak and Levaquin.  Currently has 2 doses left on Levaquin.  A week ago she went in for chemotherapy which she receives once a month (she is also on a daily p.o. chemotherapy agent).  Since then she has been feeling worse with constant severe generalized weakness, malaise, fever as high as 102F, and cough productive of yellow sputum.  No chest pain, no shortness of breath.  She has had nausea but no vomiting, no diarrhea, no abdominal pain.  Her oncologist stopped her oral chemotherapy 2 days ago.  Past Medical History:  Diagnosis Date  . Anemia   . High blood pressure   . High cholesterol   . Multiple myeloma ([place])   . Renal insufficiency     Patient Active Problem List   Diagnosis Date Noted  . Acute bronchitis 03/01/2018  . Cough 03/01/2018  . Flu-like symptoms 03/01/2018  . High blood pressure 03/01/2018  . LGI bleed 01/30/2017  . Acute blood loss anemia 01/30/2017  . Abdominal pain 01/30/2017  . Multiple myeloma ([place]) 01/30/2017    Past Surgical History:  Procedure Laterality Date  . EXPLORATORY LAPAROTOMY      Prior to Admission medications   Medication Sig Start Date End Date Taking? Authorizing Provider  albuterol (PROVENTIL HFA;VENTOLIN HFA) 108 (90 Base) MCG/ACT inhaler Inhale 2 puffs into the lungs every 6 (six) hours as needed for wheezing or shortness of breath. 11/06/16  Yes Nance Pear, MD  ALPRAZolam Duanne Moron) 0.25 MG tablet Take 0.25 mg by mouth daily as needed.   Yes [provider]  atorvastatin (LIPITOR) 10 MG tablet Take 10 mg by mouth daily. 10/05/16  Yes [provider]  Calcium Carbonate-Vitamin D 600-400 MG-UNIT tablet Take 1 tablet by mouth daily.   Yes [provider]  Cranberry 500 MG CAPS Take 500 mg by mouth every morning.   Yes [provider]  ELIQUIS 5 MG TABS tablet Take 1 tablet by mouth 2 (two) times daily. 02/21/18  Yes [provider]  ipratropium-albuterol (DUONEB) 0.5-2.5 (3) MG/3ML SOLN Take 3 mLs by nebulization every 6 (six) hours as needed. 03/04/18  Yes Boscia, Greer Ee, NP  levofloxacin (LEVAQUIN) 500 MG tablet Take 1 tablet (500 mg total) by mouth daily. 03/15/18  Yes Boscia, Heather E, NP  lisinopril (PRINIVIL,ZESTRIL) 10 MG tablet Take 10 mg by mouth daily.   Yes [provider]  Multiple Vitamins-Minerals (PRESERVISION AREDS PO) Take 1 tablet by mouth every morning.   Yes [provider]  omeprazole (PRILOSEC) 20 MG capsule Take 20 mg by mouth daily. 01/20/17  Yes [provider]  pomalidomide (POMALYST) 4 MG capsule Take 1 capsule by mouth once daily for days 1-21. 7 days rest. 02/16/18  Yes [provider]  valACYclovir (VALTREX) 500 MG tablet Take 500 mg by mouth daily. 01/20/17  Yes [provider]  acetaminophen (TYLENOL) 500 MG tablet Take 500 mg by mouth every 4 (four) hours.    [provider]  diphenhydrAMINE (BENADRYL ALLERGY) 25 MG tablet Take 25 mg  by mouth daily as needed.    [provider]  ondansetron (ZOFRAN-ODT) 4 MG disintegrating tablet DISSOLVE 1 TABLET IN MOUTH EVERY 8 HOURS AS NEEDED FOR NAUSEA 12/20/17   [provider]  polyethylene glycol (MIRALAX / GLYCOLAX) packet Take 17 g by mouth daily as needed.    [provider]  predniSONE (STERAPRED UNI-PAK 21 TAB) 10 MG (21) TBPK tablet 6 day taper - take by mouth as directed for 6 days Patient not taking: Reported on 03/15/2018 03/04/18   Ronnell Freshwater, NP  prochlorperazine (COMPAZINE) 5 MG tablet Take 5 mg by mouth every 8 (eight) hours as needed. 12/04/16   [provider]    Allergies Atenolol; Doxycycline; Epinephrine; Ivp dye [iodinated diagnostic agents]; Nsaids; Penicillins; Sulfa antibiotics; and Tramadol  Family History  Problem Relation Age of Onset  . Breast cancer Maternal Grandmother 70    Social History Social History   Tobacco Use  . Smoking status: Never Smoker  . Smokeless tobacco: Never Used  Substance Use Topics  . Alcohol use: No  . Drug use: No    Review of Systems  Constitutional: + fever, generalized weakness Eyes: Negative for visual changes. ENT: Negative for sore throat. Neck: No neck pain  Cardiovascular: Negative for chest pain. Respiratory: Negative for shortness of breath. + Cough Gastrointestinal: Negative for abdominal pain, vomiting or diarrhea.+ Nausea Genitourinary: Negative for dysuria. Musculoskeletal: Negative for back pain. Skin: Negative for rash. Neurological: Negative for headaches, weakness or numbness. Psych: No SI or HI  ____________________________________________   PHYSICAL EXAM:  VITAL SIGNS: ED Triage Vitals  Enc Vitals Group     BP 03/22/18 1458 115/62     Pulse Rate 03/22/18 1458 93     Resp 03/22/18 1458 20     Temp 03/22/18 1458 98.2 F (36.8 C)     Temp Source 03/22/18 1458 Oral     SpO2 03/22/18 1458 96 %     Weight 03/22/18 1458 132 lb (59.9 kg)     Height 03/22/18 1458 5' 4.5" (1.638 m)     Head Circumference --      Peak Flow --      Pain Score 03/22/18 1508 4     Pain Loc --      Pain Edu? --      Excl. in [place]? --     Constitutional: Alert and oriented. Well appearing and in no apparent distress. HEENT:      Head: Normocephalic and atraumatic.         Eyes: Conjunctivae are normal. Sclera is non-icteric.       Mouth/Throat: Mucous membranes are moist.       Neck: Supple with no signs of meningismus. Cardiovascular: Regular rate  and rhythm. No murmurs, gallops, or rubs. 2+ symmetrical distal pulses are present in all extremities. No JVD. Respiratory: Normal respiratory effort. Lungs are clear to auscultation bilaterally. No wheezes, crackles, or rhonchi.  Gastrointestinal: Soft, non tender, and non distended with positive bowel sounds. No rebound or guarding. Musculoskeletal: Nontender with normal range of motion in all extremities. No edema, cyanosis, or erythema of extremities. Neurologic: Normal speech and language. Face is symmetric. Moving all extremities. No gross focal neurologic deficits are appreciated. Skin: Skin is warm, dry and intact. No rash noted. Psychiatric: Mood and affect are normal. Speech and behavior are normal.  ____________________________________________   LABS (all labs ordered are listed, but only abnormal results are displayed)  Labs Reviewed  BASIC METABOLIC PANEL -  Abnormal; Notable for the following components:      Result Value   Sodium 134 (*)    CO2 20 (*)    Glucose, Bld 102 (*)    Creatinine, Ser 1.30 (*)    Calcium 8.3 (*)    GFR calc non Af Amer 40 (*)    GFR calc Af Amer 47 (*)    All other components within normal limits  CBC - Abnormal; Notable for the following components:   RDW 15.7 (*)    All other components within normal limits  DIFFERENTIAL - Abnormal; Notable for the following components:   Lymphs Abs 0.4 (*)    All other components within normal limits  CULTURE, BLOOD (ROUTINE X 2)  CULTURE, BLOOD (ROUTINE X 2)  LACTIC ACID, PLASMA   ____________________________________________  EKG  ED ECG REPORT I, Rudene Re, the attending physician, personally viewed and interpreted this ECG.  Normal sinus rhythm, rate of 81, normal intervals, normal axis, no ST elevations or depressions.  Normal EKG.  No prior for comparison. ____________________________________________  RADIOLOGY  I have personally reviewed the images performed during this visit and I  agree with the Radiologist's read.   Interpretation by Radiologist:  Dg Chest 2 View  Result Date: 03/22/2018 CLINICAL DATA:  Cough for 3 weeks. History of multiple myeloma. On chemotherapy. EXAM: CHEST - 2 VIEW COMPARISON:  03/15/2018 FINDINGS: The cardiomediastinal silhouette is within normal limits. Aortic atherosclerosis is noted. The lungs remain hyperinflated. There is a new small region of reticulonodular opacity in the right mid lung. The left lung is clear. No pleural effusion or pneumothorax is identified. No acute osseous abnormality is seen. IMPRESSION: New focal opacity in the right mid lung which may reflect developing infection, possibly atypical. Electronically Signed   By: Logan Bores M.D.   On: 03/22/2018 16:09    ____________________________________________   PROCEDURES  Procedure(s) performed: None Procedures Critical Care performed:  None ____________________________________________   INITIAL IMPRESSION / ASSESSMENT AND PLAN / ED COURSE   72 y.o. female the history of multiple myeloma currently on chemotherapy who presents for evaluation of cough, fever and generalized weakness.  Chest x-ray concerning for right middle lobe infiltrate.  Patient has failed 2 outpatient antibiotics, currently on p.o. Levaquin.  No signs or symptoms of sepsis with normal heart rate, normal sats, normal work of breathing, and afebrile at this time.  Labs showing normal white count, patient is not neutropenic.  Will start patient on cefepime and vancomycin for HCAP and admit to Hospitalist      As part of my medical decision making, I reviewed the following data within the [facility] notes reviewed and incorporated, Labs reviewed , EKG interpreted , Old chart reviewed, Radiograph reviewed , Discussed with admitting physician , Notes from prior ED visits and [place] Controlled Substance Database    Pertinent labs & imaging results that were available during my care  of the patient were reviewed by me and considered in my medical decision making (see chart for details).    ____________________________________________   FINAL CLINICAL IMPRESSION(S) / ED DIAGNOSES  Final diagnoses:  Healthcare-associated pneumonia  Immunosuppressed status ([place])      NEW MEDICATIONS STARTED DURING THIS VISIT:  ED Discharge Orders    None       Note:  This document was prepared using Dragon voice recognition software and may include unintentional dictation errors.    Alfred Levins, [place], MD 03/22/18 1910

## 2018-03-22 NOTE — ED Triage Notes (Signed)
On chemo.  Says has had uri, cough for more than 3 week.  Taking levaquin now--second round.  Now weak.

## 2018-03-22 NOTE — H&P (Signed)
 [facility] - [place] [facility]   PATIENT NAME: Sherry Oneal    MR#:  [id number]  DATE OF BIRTH:  12/10/1946  DATE OF ADMISSION:  03/22/2018  PRIMARY CARE PHYSICIAN: Khan, Fozia M, MD   REQUESTING/REFERRING PHYSICIAN: Dr. Laie Veronese  CHIEF COMPLAINT:   Chief Complaint  Patient presents with  . Cough    HISTORY OF PRESENT ILLNESS:  Sherry Oneal  is a 71 y.o. female with a known history of multiple myeloma currently in remission on chemotherapy, hypertension, CKD and anemia of chronic disease comes from home secondary to worsening cough, fevers chills and difficulty breathing. Symptoms started about 3 weeks ago. Initial chest x-ray was negative. Patient was treated with Z-Pak as an outpatient. Symptoms did not improve so was changed over to Levaquin last week. She felt slightly better 2 days into Levaquin, however she started having worsening chills and body aches for the last couple of days. She did receive her chemotherapy last week. She has a dry cough which she is getting worse and causing severe chest pain. Also complains of nausea and poor appetite. Chest x-ray here shows a new right middle lobe infiltrate. She has been exposed to people at the cancer center for her infusions.  PAST MEDICAL HISTORY:   Past Medical History:  Diagnosis Date  . Anemia   . High blood pressure   . High cholesterol   . Multiple myeloma ([facility])   . Renal insufficiency     PAST SURGICAL HISTORY:   Past Surgical History:  Procedure Laterality Date  . EXPLORATORY LAPAROTOMY      SOCIAL HISTORY:   Social History   Tobacco Use  . Smoking status: Never Smoker  . Smokeless tobacco: Never Used  Substance Use Topics  . Alcohol use: No    FAMILY HISTORY:   Family History  Problem Relation Age of Onset  . Breast cancer Maternal Grandmother 70  . CAD Mother   . CVA Mother     DRUG ALLERGIES:   Allergies  Allergen Reactions  . Atenolol     "wierd feeling"    . Doxycycline     Upset stomach   . Epinephrine     "Shaking"  . Ivp Dye [Iodinated Diagnostic Agents] Hypertension    Per tech conversation with PT, pt states that the last time she had IV dye she had increased BP and tachycardia.     . Nsaids     Bleeding  . Penicillins     rash  . Sulfa Antibiotics     Rash   . Tramadol     Numb feeling     REVIEW OF SYSTEMS:   Review of Systems  Constitutional: Positive for chills, fever and malaise/fatigue. Negative for weight loss.  HENT: Negative for ear discharge, ear pain, hearing loss and nosebleeds.   Eyes: Negative for blurred vision, double vision and photophobia.  Respiratory: Positive for cough, shortness of breath and wheezing. Negative for hemoptysis.   Cardiovascular: Negative for chest pain, palpitations, orthopnea and leg swelling.  Gastrointestinal: Negative for abdominal pain, constipation, diarrhea, heartburn, melena, nausea and vomiting.  Genitourinary: Negative for dysuria, frequency, hematuria and urgency.  Musculoskeletal: Positive for myalgias. Negative for back pain and neck pain.  Skin: Negative for rash.  Neurological: Negative for dizziness, tingling, sensory change, speech change, focal weakness and headaches.  Endo/Heme/Allergies: Does not bruise/bleed easily.  Psychiatric/Behavioral: Negative for depression.    MEDICATIONS AT HOME:   Prior to Admission medications     Medication Sig Start Date End Date Taking? Authorizing Provider  albuterol (PROVENTIL HFA;VENTOLIN HFA) 108 (90 Base) MCG/ACT inhaler Inhale 2 puffs into the lungs every 6 (six) hours as needed for wheezing or shortness of breath. 11/06/16  Yes Goodman, Graydon, MD  ALPRAZolam (XANAX) 0.25 MG tablet Take 0.25 mg by mouth daily as needed.   Yes [provider]  atorvastatin (LIPITOR) 10 MG tablet Take 10 mg by mouth daily. 10/05/16  Yes [provider]  Calcium Carbonate-Vitamin D 600-400 MG-UNIT tablet Take 1 tablet by mouth  daily.   Yes [provider]  Cranberry 500 MG CAPS Take 500 mg by mouth every morning.   Yes [provider]  ELIQUIS 5 MG TABS tablet Take 1 tablet by mouth 2 (two) times daily. 02/21/18  Yes [provider]  ipratropium-albuterol (DUONEB) 0.5-2.5 (3) MG/3ML SOLN Take 3 mLs by nebulization every 6 (six) hours as needed. 03/04/18  Yes Boscia, Heather E, NP  levofloxacin (LEVAQUIN) 500 MG tablet Take 1 tablet (500 mg total) by mouth daily. 03/15/18  Yes Boscia, Heather E, NP  lisinopril (PRINIVIL,ZESTRIL) 10 MG tablet Take 10 mg by mouth daily.   Yes [provider]  Multiple Vitamins-Minerals (PRESERVISION AREDS PO) Take 1 tablet by mouth every morning.   Yes [provider]  omeprazole (PRILOSEC) 20 MG capsule Take 20 mg by mouth daily. 01/20/17  Yes [provider]  pomalidomide (POMALYST) 4 MG capsule Take 1 capsule by mouth once daily for days 1-21. 7 days rest. 02/16/18  Yes [provider]  valACYclovir (VALTREX) 500 MG tablet Take 500 mg by mouth daily. 01/20/17  Yes [provider]  acetaminophen (TYLENOL) 500 MG tablet Take 500 mg by mouth every 4 (four) hours.    [provider]  diphenhydrAMINE (BENADRYL ALLERGY) 25 MG tablet Take 25 mg by mouth daily as needed.    [provider]  ondansetron (ZOFRAN-ODT) 4 MG disintegrating tablet DISSOLVE 1 TABLET IN MOUTH EVERY 8 HOURS AS NEEDED FOR NAUSEA 12/20/17   [provider]  polyethylene glycol (MIRALAX / GLYCOLAX) packet Take 17 g by mouth daily as needed.    [provider]  predniSONE (STERAPRED UNI-PAK 21 TAB) 10 MG (21) TBPK tablet 6 day taper - take by mouth as directed for 6 days Patient not taking: Reported on 03/15/2018 03/04/18   Boscia, Heather E, NP  prochlorperazine (COMPAZINE) 5 MG tablet Take 5 mg by mouth every 8 (eight) hours as needed. 12/04/16   [provider]      VITAL SIGNS:  Blood pressure 137/60, pulse 84,  temperature 98.4 F (36.9 C), temperature source Oral, resp. rate 13, height 5' 4.5" (1.638 m), weight 59.9 kg (132 lb), SpO2 94 %.  PHYSICAL EXAMINATION:   Physical Exam  GENERAL:  71 y.o.-year-old patient lying in the bed with no acute distress.  EYES: Pupils equal, round, reactive to light and accommodation. No scleral icterus. Extraocular muscles intact.  HEENT: Head atraumatic, normocephalic. Oropharynx and nasopharynx clear.  NECK:  Supple, no jugular venous distention. No thyroid enlargement, no tenderness.  LUNGS: Normal breath sounds bilaterally, no wheezing, rales,rhonchi or crepitation. No use of accessory muscles of respiration. Decreased bibasilar breath sounds, scattered right basilar rhonchi CARDIOVASCULAR: S1, S2 normal. No rubs, or gallops. 2/6 systolic murmur present ABDOMEN: Soft, nontender, nondistended. Bowel sounds present. No organomegaly or mass.  EXTREMITIES: No pedal edema, cyanosis, or clubbing.  NEUROLOGIC: Cranial nerves II through XII are intact. Muscle strength 5/5 in all   extremities. Sensation intact. Gait not checked.global weakness noted  PSYCHIATRIC: The patient is alert and oriented x 3.  SKIN: No obvious rash, lesion, or ulcer.   LABORATORY PANEL:   CBC Recent Labs  Lab 03/22/18 1519  WBC 3.8  HGB 13.0  HCT 38.8  PLT 180   ------------------------------------------------------------------------------------------------------------------  Chemistries  Recent Labs  Lab 03/22/18 1519  NA 134*  K 3.9  CL 104  CO2 20*  GLUCOSE 102*  BUN 15  CREATININE 1.30*  CALCIUM 8.3*   ------------------------------------------------------------------------------------------------------------------  Cardiac Enzymes No results for input(s): TROPONINI in the last 168 hours. ------------------------------------------------------------------------------------------------------------------  RADIOLOGY:  Dg Chest 2 View  Result Date:  03/22/2018 CLINICAL DATA:  Cough for 3 weeks. History of multiple myeloma. On chemotherapy. EXAM: CHEST - 2 VIEW COMPARISON:  03/15/2018 FINDINGS: The cardiomediastinal silhouette is within normal limits. Aortic atherosclerosis is noted. The lungs remain hyperinflated. There is a new small region of reticulonodular opacity in the right mid lung. The left lung is clear. No pleural effusion or pneumothorax is identified. No acute osseous abnormality is seen. IMPRESSION: New focal opacity in the right mid lung which may reflect developing infection, possibly atypical. Electronically Signed   By: Logan Bores M.D.   On: 03/22/2018 16:09    EKG:   Orders placed or performed during the hospital encounter of 03/22/18  . ED EKG within 10 minutes  . ED EKG within 10 minutes    IMPRESSION AND PLAN:   Sherry Oneal  is a 72 y.o. female with a known history of multiple myeloma currently in remission on chemotherapy, hypertension, CK D and anemia of chronic disease comes from home secondary to worsening cough, fevers chills and difficulty breathing.  1. Healthcare acquired pneumonia-underlying immunosuppression from chemotherapy -admitted, blood cultures have been ordered. Non-neutropenic -MRSA PCR pending, currently on vancomycin and cefepime -Cough medications added. DuoNeb's as needed for reactive airway wheezing  2.multiple myeloma-follows with [facility]. Currently on oral chemotherapy which is on hold due to underlying infection. -Continue outpatient follow-up  3. CK D stage III-stable at this time.  4. Hypertension-on lisinopril  5. DVT prophylaxis-patient is on eliquis at home-according to her oncology notes, she is started on eliquis for prophylaxis with her underlying malignancy.  Physical therapy consulted   All the records are reviewed and case discussed with ED provider. Management plans discussed with the patient, family and they are in agreement.  CODE STATUS: Full Code  TOTAL TIME  TAKING CARE OF THIS PATIENT: 50 minutes.    Gladstone Lighter M.D on 03/22/2018 at 7:57 PM  Between 7am to 6pm - Pager - [phone]  After 6pm go to [email or web address] - password [email or web address] [place] [facility]  Office  [phone]  CC: Primary care physician; Lavera Guise, MD

## 2018-03-22 NOTE — Progress Notes (Signed)
Pharmacy Antibiotic Note  Sherry Oneal is a 72 y.o. female admitted on 03/22/2018 with pneumonia.  Pharmacy has been consulted for cefepime/vancomycin dosing.  Plan: Vancomycin 750mg  IV every 24 hours.  Goal trough 15-20 mcg/mL. cefepime 2gm iv q12h  Vancomycin trough 3/30@0730  Height: 5' 4.5" (163.8 cm) Weight: 132 lb (59.9 kg) IBW/kg (Calculated) : 55.85  Temp (24hrs), Avg:98.3 F (36.8 C), Min:98.2 F (36.8 C), Max:98.4 F (36.9 C)  Recent Labs  Lab 03/22/18 1519 03/22/18 1846  WBC 3.8  --   CREATININE 1.30*  --   LATICACIDVEN  --  1.2    Estimated Creatinine Clearance: 35 mL/min (A) (by C-G formula based on SCr of 1.3 mg/dL (H)).    Allergies  Allergen Reactions  . Atenolol     "wierd feeling"  . Doxycycline     Upset stomach   . Epinephrine     "Shaking"  . Ivp Dye [Iodinated Diagnostic Agents] Hypertension    Per tech conversation with PT, pt states that the last time she had IV dye she had increased BP and tachycardia.     . Nsaids     Bleeding  . Penicillins     rash  . Sulfa Antibiotics     Rash   . Tramadol     Numb feeling     Antimicrobials this admission: Anti-infectives (From admission, onward)   Start     Dose/Rate Route Frequency Ordered Stop   03/23/18 0800  vancomycin (VANCOCIN) IVPB 750 mg/150 ml premix     750 mg 150 mL/hr over 60 Minutes Intravenous Every 24 hours 03/22/18 1953     03/23/18 0800  ceFEPIme (MAXIPIME) 2 g in sodium chloride 0.9 % 100 mL IVPB     2 g 200 mL/hr over 30 Minutes Intravenous Every 12 hours 03/22/18 1954     03/22/18 2000  ceFEPIme (MAXIPIME) 1 g in sodium chloride 0.9 % 100 mL IVPB     1 g 200 mL/hr over 30 Minutes Intravenous  Once 03/22/18 1954     03/22/18 1900  ceFEPIme (MAXIPIME) 1 g in sodium chloride 0.9 % 100 mL IVPB     1 g 200 mL/hr over 30 Minutes Intravenous  Once 03/22/18 1845     03/22/18 1900  vancomycin (VANCOCIN) IVPB 1000 mg/200 mL premix     1,000 mg 200 mL/hr over 60 Minutes  Intravenous  Once 03/22/18 1845        Microbiology results: No results found for this or any previous visit (from the past 240 hour(s)).   Thank you for allowing pharmacy to be a part of this patient's care.  Sherry Oneal 03/22/2018 7:55 PM

## 2018-03-23 ENCOUNTER — Other Ambulatory Visit: Payer: Self-pay

## 2018-03-23 LAB — BASIC METABOLIC PANEL
ANION GAP: 9 (ref 5–15)
BUN: 15 mg/dL (ref 6–20)
CHLORIDE: 110 mmol/L (ref 101–111)
CO2: 19 mmol/L — AB (ref 22–32)
CREATININE: 0.91 mg/dL (ref 0.44–1.00)
Calcium: 7.8 mg/dL — ABNORMAL LOW (ref 8.9–10.3)
GFR calc non Af Amer: >60 mL/min (ref >60)
Glucose, Bld: 89 mg/dL (ref 65–99)
POTASSIUM: 3.5 mmol/L (ref 3.5–5.1)
SODIUM: 138 mmol/L (ref 135–145)

## 2018-03-23 LAB — CBC
HCT: 38 % (ref 35.0–47.0)
HEMOGLOBIN: 12.7 g/dL (ref 12.0–16.0)
MCH: 31.3 pg (ref 26.0–34.0)
MCHC: 33.5 g/dL (ref 32.0–36.0)
MCV: 93.3 fL (ref 80.0–100.0)
PLATELETS: 158 10*3/uL (ref 150–440)
RBC: 4.07 MIL/uL (ref 3.80–5.20)
RDW: 16.1 % — ABNORMAL HIGH (ref 11.5–14.5)
WBC: 3.2 10*3/uL — ABNORMAL LOW (ref 3.6–11.0)

## 2018-03-23 LAB — MRSA PCR SCREENING: MRSA by PCR: NEGATIVE

## 2018-03-23 MED ORDER — PREDNISONE 20 MG PO TABS
60.0000 mg | ORAL_TABLET | Freq: Every day | ORAL | Status: DC
  Administered 2018-03-23 – 2018-03-24 (×2): 60 mg via ORAL
  Filled 2018-03-23 (×2): qty 3

## 2018-03-23 MED ORDER — IPRATROPIUM-ALBUTEROL 0.5-2.5 (3) MG/3ML IN SOLN
3.0000 mL | Freq: Four times a day (QID) | RESPIRATORY_TRACT | Status: DC
  Administered 2018-03-23 (×2): 3 mL via RESPIRATORY_TRACT
  Filled 2018-03-23 (×2): qty 3

## 2018-03-23 MED ORDER — GUAIFENESIN-DM 100-10 MG/5ML PO SYRP
5.0000 mL | ORAL_SOLUTION | ORAL | Status: DC | PRN
  Administered 2018-03-23 – 2018-03-24 (×5): 5 mL via ORAL
  Filled 2018-03-23 (×6): qty 5

## 2018-03-23 MED ORDER — IPRATROPIUM-ALBUTEROL 0.5-2.5 (3) MG/3ML IN SOLN
3.0000 mL | Freq: Two times a day (BID) | RESPIRATORY_TRACT | Status: DC
  Administered 2018-03-24: 08:00:00 3 mL via RESPIRATORY_TRACT
  Filled 2018-03-23: qty 3

## 2018-03-23 MED ORDER — ALBUTEROL SULFATE (2.5 MG/3ML) 0.083% IN NEBU: 2.5000 mg | INHALATION_SOLUTION | RESPIRATORY_TRACT | Status: DC | PRN

## 2018-03-23 MED ORDER — ALPRAZOLAM 0.25 MG PO TABS
0.2500 mg | ORAL_TABLET | Freq: Every evening | ORAL | Status: DC | PRN
  Administered 2018-03-23: 23:00:00 0.25 mg via ORAL
  Filled 2018-03-23: qty 1

## 2018-03-23 MED ORDER — IPRATROPIUM-ALBUTEROL 0.5-2.5 (3) MG/3ML IN SOLN: 3.0000 mL | Freq: Four times a day (QID) | RESPIRATORY_TRACT | Status: DC | PRN

## 2018-03-23 NOTE — Progress Notes (Signed)
Spoke with MD narcotic cough medication, multi vitamin and calcium  d/c per pt request.

## 2018-03-23 NOTE — Plan of Care (Signed)
  Problem: Health Behavior/Discharge Planning: Goal: Ability to manage health-related needs will improve Outcome: Progressing   Problem: Clinical Measurements: Goal: Respiratory complications will improve Outcome: Progressing   Problem: Nutrition: Goal: Adequate nutrition will be maintained Outcome: Progressing   Problem: Pain Managment: Goal: General experience of comfort will improve Outcome: Progressing   Problem: Safety: Goal: Ability to remain free from injury will improve Outcome: Progressing   Problem: Skin Integrity: Goal: Risk for impaired skin integrity will decrease Outcome: Progressing

## 2018-03-23 NOTE — Progress Notes (Signed)
[facility] [place] NAME: Bessye Stith    MR#:  [id number]  DATE OF BIRTH:  12-28-1946  SUBJECTIVE:  CHIEF COMPLAINT:   Chief Complaint  Patient presents with  . Cough   Afebrile today.  Continues to feel weak and has cough.  Dry.  Has been on azithromycin and Levaquin as outpatient.  REVIEW OF SYSTEMS:    Review of Systems  Constitutional: Positive for malaise/fatigue. Negative for chills and fever.  HENT: Negative for sore throat.   Eyes: Negative for blurred vision, double vision and pain.  Respiratory: Positive for cough, shortness of breath and wheezing. Negative for hemoptysis.   Cardiovascular: Negative for chest pain, palpitations, orthopnea and leg swelling.  Gastrointestinal: Negative for abdominal pain, constipation, diarrhea, heartburn, nausea and vomiting.  Genitourinary: Negative for dysuria and hematuria.  Musculoskeletal: Negative for back pain and joint pain.  Skin: Negative for rash.  Neurological: Negative for sensory change, speech change, focal weakness and headaches.  Endo/Heme/Allergies: Does not bruise/bleed easily.  Psychiatric/Behavioral: Negative for depression. The patient is not nervous/anxious.     DRUG ALLERGIES:   Allergies  Allergen Reactions  . Aspirin Other (See Comments)    Causes internal bleeding per patient  . Atenolol     "wierd feeling"  . Doxycycline     Upset stomach   . Epinephrine     "Shaking"  . Ivp Dye [Iodinated Diagnostic Agents] Hypertension    Per tech conversation with PT, pt states that the last time she had IV dye she had increased BP and tachycardia.     . Nsaids     Bleeding  . Penicillins     rash  . Sulfa Antibiotics     Rash   . Tramadol     Numb feeling     VITALS:  Blood pressure 108/62, pulse 68, temperature 97.6 F (36.4 C), temperature source Oral, resp. rate 16, height 5' 4.5" (1.638 m), weight 59.9 kg (132 lb), SpO2 96 %.  PHYSICAL EXAMINATION:    Physical Exam  GENERAL:  72 y.o.-year-old patient lying in the bed with no acute distress.  EYES: Pupils equal, round, reactive to light and accommodation. No scleral icterus. Extraocular muscles intact.  HEENT: Head atraumatic, normocephalic. Oropharynx and nasopharynx clear.  NECK:  Supple, no jugular venous distention. No thyroid enlargement, no tenderness.  LUNGS: Bilateral wheezing.  Decreased air entry. CARDIOVASCULAR: S1, S2 normal. No murmurs, rubs, or gallops.  ABDOMEN: Soft, nontender, nondistended. Bowel sounds present. No organomegaly or mass.  EXTREMITIES: No cyanosis, clubbing or edema b/l.    NEUROLOGIC: Cranial nerves II through XII are intact. No focal Motor or sensory deficits b/l.   PSYCHIATRIC: The patient is alert and oriented x 3.  SKIN: No obvious rash, lesion, or ulcer.   LABORATORY PANEL:   CBC Recent Labs  Lab 03/23/18 0734  WBC 3.2*  HGB 12.7  HCT 38.0  PLT 158   ------------------------------------------------------------------------------------------------------------------ Chemistries  Recent Labs  Lab 03/23/18 0734  NA 138  K 3.5  CL 110  CO2 19*  GLUCOSE 89  BUN 15  CREATININE 0.91  CALCIUM 7.8*   ------------------------------------------------------------------------------------------------------------------  Cardiac Enzymes No results for input(s): TROPONINI in the last 168 hours. ------------------------------------------------------------------------------------------------------------------  RADIOLOGY:  Dg Chest 2 View  Result Date: 03/22/2018 CLINICAL DATA:  Cough for 3 weeks. History of multiple myeloma. On chemotherapy. EXAM: CHEST - 2 VIEW COMPARISON:  03/15/2018 FINDINGS: The cardiomediastinal silhouette is within normal limits. Aortic atherosclerosis is  noted. The lungs remain hyperinflated. There is a new small region of reticulonodular opacity in the right mid lung. The left lung is clear. No pleural effusion or  pneumothorax is identified. No acute osseous abnormality is seen. IMPRESSION: New focal opacity in the right mid lung which may reflect developing infection, possibly atypical. Electronically Signed   By: Logan Bores M.D.   On: 03/22/2018 16:09     ASSESSMENT AND PLAN:   * RML pneumonia On IV abx Stop vancomycin  * Acute bronchitis Start prednisone Scheduled nebs  * CKD3 Stable  * HTN Lisinopril   All the records are reviewed and case discussed with Care Management/Social Worker Management plans discussed with the patient, family and they are in agreement.  CODE STATUS: FULL CODE  DVT Prophylaxis: SCDs  TOTAL TIME TAKING CARE OF THIS PATIENT: 35 minutes.   POSSIBLE D/C IN 1-2 DAYS, DEPENDING ON CLINICAL CONDITION.  Leia Alf Jaykob Minichiello M.D on 03/23/2018 at 9:52 AM  Between 7am to 6pm - Pager - [phone]  After 6pm go to [email or web address] - password [email or web address] [facility]  Office  [phone]  CC: Primary care physician; Lavera Guise, MD  Note: This dictation was prepared with Dragon dictation along with smaller phrase technology. Any transcriptional errors that result from this process are unintentional.

## 2018-03-23 NOTE — Evaluation (Signed)
Physical Therapy Evaluation Patient Details Name: Sherry Oneal MRN: [id number] DOB: January 13, 1946 Today's Date: 03/23/2018   History of Present Illness  Patient is a 72 year old female admitted for pneumonia.  PMH includes CKD, anemia, Htn, hypercholestrolemia and multiple myeloma.  Clinical Impression  Pt is a 72 y.o. Female who lives in a one story home with her husband.  She is generally active and independent without AD at baseline.  Pt was in bed upon PT arrival and able to perform bed mobility and STS independently.  She presented with WNL strength of bilateral UE/LE and sensation intact.  Pt ambulated 200 ft without AD, presenting with gait deviations indicative of fall risk.  Pt slows gait and demonstrates a crossover gait pattern when performing head turns and neck flexion/extension.  Static balance testing also reveals unsteadiness on feet.  Pt states that she is unclear as to whether these deficits are occurring due to present illness of if she was unsteady on feet prior.  Pt will benefit from PT with focus on balance and fall prevention as well as possible use of a SPC for the duration of recovery process and further if needed.    Follow Up Recommendations Outpatient PT    Equipment Recommendations       Recommendations for Other Services       Precautions / Restrictions Precautions Precautions: Fall Restrictions Weight Bearing Restrictions: No      Mobility  Bed Mobility Overal bed mobility: Independent                Transfers Overall transfer level: Independent                  Ambulation/Gait Ambulation/Gait assistance: Supervision Ambulation Distance (Feet): 200 Feet       Gait velocity interpretation: at or above normal speed for age/gender General Gait Details: Pt able to complete ambulation without AD and demonstrating good foot clearance.  Pt presents with a narrow BOS with occasional crossover gait and required to slow gait speed for higher  level dynamic gait activity.  Stairs            Wheelchair Mobility    Modified Rankin (Stroke Patients Only)       Balance Overall balance assessment: Independent Sitting-balance support: Feet supported                         High level balance activites: Side stepping;Backward walking;Direction changes;Sudden stops;Head turns High Level Balance Comments: Pt performs all higher level gait activity with a slowed gait pattern and often with a crossover step.  Pt states that she is unclear as to whether this is related to pnumonia related weakness or if these patterns were present prior to illness.             Pertinent Vitals/Pain Pain Assessment: No/denies pain    Home Living Family/patient expects to be discharged to:: Private residence Living Arrangements: Spouse/significant other Available Help at Discharge: Family Type of Home: House Home Access: Stairs to enter Entrance Stairs-Rails: Can reach both Entrance Stairs-Number of Steps: 1 Home Layout: One level Home Equipment: Grab bars - toilet;Grab bars - tub/shower      Prior Function Level of Independence: Independent               Hand Dominance        Extremity/Trunk Assessment   Upper Extremity Assessment Upper Extremity Assessment: Overall WFL for tasks assessed(Grossly 4/5 with shoulder ROM  WNL)    Lower Extremity Assessment Lower Extremity Assessment: Overall WFL for tasks assessed(Grossly 4+/5 bilaterally)    Cervical / Trunk Assessment Cervical / Trunk Assessment: Normal  Communication   Communication: HOH  Cognition Arousal/Alertness: Awake/alert Behavior During Therapy: WFL for tasks assessed/performed Overall Cognitive Status: Within Functional Limits for tasks assessed                                        General Comments      Exercises     Assessment/Plan    PT Assessment Patient needs continued PT services  PT Problem List Decreased  balance;Decreased knowledge of use of DME       PT Treatment Interventions DME instruction;Therapeutic activities;Gait training;Therapeutic exercise;Stair training;Balance training;Functional mobility training;Neuromuscular re-education;Patient/family education    PT Goals (Current goals can be found in the Care Plan section)  Acute Rehab PT Goals Patient Stated Goal: To return home and resume normal level of independence. PT Goal Formulation: With patient Time For Goal Achievement: 04/06/18 Potential to Achieve Goals: Good    Frequency Min 2X/week   Barriers to discharge        Co-evaluation               AM-PAC PT "6 Clicks" Daily Activity  Outcome Measure Difficulty turning over in bed (including adjusting bedclothes, sheets and blankets)?: None Difficulty moving from lying on back to sitting on the side of the bed? : None Difficulty sitting down on and standing up from a chair with arms (e.g., wheelchair, bedside commode, etc,.)?: None Help needed moving to and from a bed to chair (including a wheelchair)?: None Help needed walking in hospital room?: A Little Help needed climbing 3-5 steps with a railing? : A Little 6 Click Score: 22    End of Session Equipment Utilized During Treatment: Gait belt Activity Tolerance: Patient tolerated treatment well Patient left: in chair;with call bell/phone within reach;with chair alarm set Nurse Communication: (Pt's phone is not ringing in room.  Notified NA.) PT Visit Diagnosis: Unsteadiness on feet (R26.81)    Time: 1000-1025 PT Time Calculation (min) (ACUTE ONLY): 25 min   Charges:   PT Evaluation $PT Eval Low Complexity: 1 Low     PT G Codes:   PT G-Codes **NOT FOR INPATIENT CLASS** Functional Assessment Tool Used: AM-PAC 6 Clicks Basic Mobility    Roxanne Gates, PT, DPT   Roxanne Gates 03/23/2018, 10:38 AM

## 2018-03-23 NOTE — Progress Notes (Signed)
Text paged MD concerning patient request for cough medication w/o narcotics. See new order.

## 2018-03-24 MED ORDER — PREDNISONE 10 MG (21) PO TBPK: ORAL_TABLET | ORAL | 0 refills | Status: DC

## 2018-03-24 MED ORDER — GUAIFENESIN-CODEINE 100-10 MG/5ML PO SOLN: 5.0000 mL | Freq: Four times a day (QID) | ORAL | 0 refills | Status: DC | PRN

## 2018-03-24 MED ORDER — GUAIFENESIN-CODEINE 100-10 MG/5ML PO SOLN
5.0000 mL | Freq: Four times a day (QID) | ORAL | Status: DC | PRN
  Administered 2018-03-24: 10:00:00 5 mL via ORAL
  Filled 2018-03-24: qty 5

## 2018-03-24 MED ORDER — ALBUTEROL SULFATE HFA 108 (90 BASE) MCG/ACT IN AERS: 2.0000 | INHALATION_SPRAY | Freq: Four times a day (QID) | RESPIRATORY_TRACT | 0 refills | Status: DC | PRN

## 2018-03-24 MED ORDER — CEPHALEXIN 250 MG PO CAPS: 250.0000 mg | ORAL_CAPSULE | Freq: Three times a day (TID) | ORAL | 0 refills | Status: AC

## 2018-03-24 NOTE — Care Management (Signed)
Spoke with patient regarding outpatient PT recommendations. She doesn't feel like she needs it at this time. She states she will call her PCP if she changes her mind. Patient denies any furhter concerns.

## 2018-03-24 NOTE — Progress Notes (Signed)
Discharge instructions given and went over with patient at bedside. Prescription given and reviewed. All questions answered. Patient discharged home with husband via wheelchair by volunteer services. Madlyn Frankel, RN

## 2018-03-24 NOTE — Discharge Instructions (Signed)
Resume diet and activity as before ° ° °

## 2018-03-25 NOTE — Discharge Summary (Signed)
[facility] [place] NAME: Sherry Oneal    MR#:  [id number]  DATE OF BIRTH:  03-05-1946  DATE OF ADMISSION:  03/22/2018 ADMITTING PHYSICIAN: Gladstone Lighter, MD  DATE OF DISCHARGE: 03/24/2018  2:31 PM  PRIMARY CARE PHYSICIAN: Lavera Guise, MD   ADMISSION DIAGNOSIS:  Healthcare-associated pneumonia [J18.9] Immunosuppressed status ([place]) [D89.9]  DISCHARGE DIAGNOSIS:  Active Problems:   HCAP (healthcare-associated pneumonia)   SECONDARY DIAGNOSIS:   Past Medical History:  Diagnosis Date  . Anemia   . High blood pressure   . High cholesterol   . Multiple myeloma ([place])   . Renal insufficiency      ADMITTING HISTORY  HISTORY OF PRESENT ILLNESS:  Sherry Oneal  is a 72 y.o. female with a known history of multiple myeloma currently in remission on chemotherapy, hypertension, CKD and anemia of chronic disease comes from home secondary to worsening cough, fevers chills and difficulty breathing. Symptoms started about 3 weeks ago. Initial chest x-ray was negative. Patient was treated with Z-Pak as an outpatient. Symptoms did not improve so was changed over to Levaquin last week. She felt slightly better 2 days into Levaquin, however she started having worsening chills and body aches for the last couple of days. She did receive her chemotherapy last week. She has a dry cough which she is getting worse and causing severe chest pain. Also complains of nausea and poor appetite. Chest x-ray here shows a new right middle lobe infiltrate. She has been exposed to people at the cancer center for her infusions.     HOSPITAL COURSE:   *Right middle lobe pneumonia Patient started on IV antibiotics in the hospital.  Was on vancomycin initially which was stopped after MRSA PCR was negative.  Patient is being discharged home on Keflex as she recently was on azithromycin and Levaquin. Cough is her main issue and has improved well on Robitussin codeine  syrup.  *On prednisone and albuterol for wheezing per  CKD stage III and hypertension stable  Patient discharged home in stable condition.  CONSULTS OBTAINED:    DRUG ALLERGIES:   Allergies  Allergen Reactions  . Aspirin Other (See Comments)    Causes internal bleeding per patient  . Atenolol     "wierd feeling"  . Doxycycline     Upset stomach   . Epinephrine     "Shaking"  . Ivp Dye [Iodinated Diagnostic Agents] Hypertension    Per tech conversation with PT, pt states that the last time she had IV dye she had increased BP and tachycardia.     . Nsaids     Bleeding  . Penicillins     rash  . Sulfa Antibiotics     Rash   . Tramadol     Numb feeling     DISCHARGE MEDICATIONS:   Allergies as of 03/24/2018      Reactions   Aspirin Other (See Comments)   Causes internal bleeding per patient   Atenolol    "wierd feeling"   Doxycycline    Upset stomach   Epinephrine    "Shaking"   Ivp Dye [iodinated Diagnostic Agents] Hypertension   Per tech conversation with PT, pt states that the last time she had IV dye she had increased BP and tachycardia.      Nsaids    Bleeding   Penicillins    rash   Sulfa Antibiotics    Rash   Tramadol    Numb feeling  Medication List    TAKE these medications   acetaminophen 500 MG tablet Commonly known as:  TYLENOL Take 500 mg by mouth every 4 (four) hours.   albuterol 108 (90 Base) MCG/ACT inhaler Commonly known as:  PROVENTIL HFA;VENTOLIN HFA Inhale 2 puffs into the lungs every 6 (six) hours as needed for wheezing or shortness of breath. What changed:  Another medication with the same name was added. Make sure you understand how and when to take each.   albuterol 108 (90 Base) MCG/ACT inhaler Commonly known as:  PROVENTIL HFA;VENTOLIN HFA Inhale 2 puffs into the lungs every 6 (six) hours as needed for wheezing or shortness of breath. What changed:  You were already taking a medication with the same name, and this  prescription was added. Make sure you understand how and when to take each.   ALPRAZolam 0.25 MG tablet Commonly known as:  XANAX Take 0.25 mg by mouth daily as needed.   atorvastatin 10 MG tablet Commonly known as:  LIPITOR Take 10 mg by mouth daily.   BENADRYL ALLERGY 25 MG tablet Generic drug:  diphenhydrAMINE Take 25 mg by mouth daily as needed.   Calcium Carbonate-Vitamin D 600-400 MG-UNIT tablet Take 1 tablet by mouth daily.   cephALEXin 250 MG capsule Commonly known as:  KEFLEX Take 1 capsule (250 mg total) by mouth 3 (three) times daily for 7 days.   Cranberry 500 MG Caps Take 500 mg by mouth every morning.   ELIQUIS 5 MG Tabs tablet Generic drug:  apixaban Take 1 tablet by mouth 2 (two) times daily.   guaiFENesin-codeine 100-10 MG/5ML syrup Take 5 mLs by mouth every 6 (six) hours as needed for cough.   ipratropium-albuterol 0.5-2.5 (3) MG/3ML Soln Commonly known as:  DUONEB Take 3 mLs by nebulization every 6 (six) hours as needed.   levofloxacin 500 MG tablet Commonly known as:  LEVAQUIN Take 1 tablet (500 mg total) by mouth daily.   lisinopril 10 MG tablet Commonly known as:  PRINIVIL,ZESTRIL Take 10 mg by mouth daily.   omeprazole 20 MG capsule Commonly known as:  PRILOSEC Take 20 mg by mouth daily.   ondansetron 4 MG disintegrating tablet Commonly known as:  ZOFRAN-ODT DISSOLVE 1 TABLET IN MOUTH EVERY 8 HOURS AS NEEDED FOR NAUSEA   polyethylene glycol packet Commonly known as:  MIRALAX / GLYCOLAX Take 17 g by mouth daily as needed.   pomalidomide 4 MG capsule Commonly known as:  POMALYST Take 1 capsule by mouth once daily for days 1-21. 7 days rest.   predniSONE 10 MG (21) Tbpk tablet Commonly known as:  STERAPRED UNI-PAK 21 TAB 6 day taper - take by mouth as directed for 6 days   PRESERVISION AREDS PO Take 1 tablet by mouth every morning.   prochlorperazine 5 MG tablet Commonly known as:  COMPAZINE Take 5 mg by mouth every 8 (eight)  hours as needed.   valACYclovir 500 MG tablet Commonly known as:  VALTREX Take 500 mg by mouth daily.       Today   VITAL SIGNS:  Blood pressure (!) 116/45, pulse 73, temperature 97.8 F (36.6 C), temperature source Oral, resp. rate 14, height 5' 4.5" (1.638 m), weight 59.9 kg (132 lb), SpO2 96 %.  I/O:  No intake or output data in the 24 hours ending 03/25/18 1922  PHYSICAL EXAMINATION:  Physical Exam  GENERAL:  72 y.o.-year-old patient lying in the bed with no acute distress.  LUNGS: Normal breath sounds bilaterally, no  wheezing, rales,rhonchi or crepitation. No use of accessory muscles of respiration.  CARDIOVASCULAR: S1, S2 normal. No murmurs, rubs, or gallops.  ABDOMEN: Soft, non-tender, non-distended. Bowel sounds present. No organomegaly or mass.  NEUROLOGIC: Moves all 4 extremities. PSYCHIATRIC: The patient is alert and oriented x 3.  SKIN: No obvious rash, lesion, or ulcer.   DATA REVIEW:   CBC Recent Labs  Lab 03/23/18 0734  WBC 3.2*  HGB 12.7  HCT 38.0  PLT 158    Chemistries  Recent Labs  Lab 03/23/18 0734  NA 138  K 3.5  CL 110  CO2 19*  GLUCOSE 89  BUN 15  CREATININE 0.91  CALCIUM 7.8*    Cardiac Enzymes No results for input(s): TROPONINI in the last 168 hours.  Microbiology Results  Results for orders placed or performed during the hospital encounter of 03/22/18  Blood culture (routine x 2)     Status: None (Preliminary result)   Collection Time: 03/22/18  7:07 PM  Result Value Ref Range Status   Specimen Description BLOOD BLOOD RIGHT WRIST  Final   Special Requests   Final    BOTTLES DRAWN AEROBIC AND ANAEROBIC Blood Culture adequate volume   Culture   Final    NO GROWTH 3 DAYS Performed at [facility], [address], [place], [place] [address]    Report Status PENDING  Incomplete  Blood culture (routine x 2)     Status: None (Preliminary result)   Collection Time: 03/22/18  7:07 PM  Result Value Ref Range Status    Specimen Description BLOOD BLOOD RIGHT FOREARM  Final   Special Requests   Final    BOTTLES DRAWN AEROBIC AND ANAEROBIC Blood Culture adequate volume   Culture   Final    NO GROWTH 3 DAYS Performed at [facility], [address], [place], [place] [address]    Report Status PENDING  Incomplete  MRSA PCR Screening     Status: None   Collection Time: 03/22/18 11:06 PM  Result Value Ref Range Status   MRSA by PCR NEGATIVE NEGATIVE Final    Comment:        The GeneXpert MRSA Assay (FDA approved for NASAL specimens only), is one component of a comprehensive MRSA colonization surveillance program. It is not intended to diagnose MRSA infection nor to guide or monitor treatment for MRSA infections. Performed at [facility], [address] [place],  [address]     RADIOLOGY:  No results found.  Follow up with PCP in 1 week.  Management plans discussed with the patient, family and they are in agreement.  CODE STATUS:  Code Status History    Date Active Date Inactive Code Status Order ID Comments User Context   03/22/2018 2105 03/24/2018 1806 Full Code [id number]  Gladstone Lighter, MD Inpatient   01/30/2017 1517 02/01/2017 1747 Full Code [id number]  Idelle Crouch, MD Inpatient    Advance Directive Documentation     Most Recent Value  Type of Advance Directive  Healthcare Power of Attorney, Living will  Pre-existing out of facility DNR order (yellow form or pink MOST form)  -  "MOST" Form in Place?  -      TOTAL TIME TAKING CARE OF THIS PATIENT ON DAY OF DISCHARGE: more than 30 minutes.   Neita Carp M.D on 03/25/2018 at 7:22 PM  Between 7am to 6pm - Pager - [phone]  After 6pm go to [email or web address] - password [email or web address] [facility]  Office  [phone]  CC: Primary care physician; Lavera Guise, MD  Note: This dictation was prepared with Dragon dictation along with smaller phrase technology. Any transcriptional errors  that result from this process are unintentional.

## 2018-03-27 LAB — CULTURE, BLOOD (ROUTINE X 2)
CULTURE: NO GROWTH
Culture: NO GROWTH
Special Requests: ADEQUATE
Special Requests: ADEQUATE

## 2018-03-29 DIAGNOSIS — R69 Illness, unspecified: Secondary | ICD-10-CM | POA: Diagnosis not present

## 2018-03-30 ENCOUNTER — Other Ambulatory Visit: Payer: Self-pay

## 2018-03-30 DIAGNOSIS — R05 Cough: Secondary | ICD-10-CM

## 2018-03-30 DIAGNOSIS — R059 Cough, unspecified: Secondary | ICD-10-CM

## 2018-03-30 DIAGNOSIS — R69 Illness, unspecified: Secondary | ICD-10-CM | POA: Diagnosis not present

## 2018-03-30 MED ORDER — IPRATROPIUM-ALBUTEROL 0.5-2.5 (3) MG/3ML IN SOLN: 3.0000 mL | Freq: Four times a day (QID) | RESPIRATORY_TRACT | 3 refills | Status: DC | PRN

## 2018-04-05 ENCOUNTER — Ambulatory Visit (INDEPENDENT_AMBULATORY_CARE_PROVIDER_SITE_OTHER): Payer: Medicare HMO | Admitting: Internal Medicine

## 2018-04-05 ENCOUNTER — Encounter: Payer: Self-pay | Admitting: Internal Medicine

## 2018-04-05 VITALS — BP 128/80 | HR 76 | Resp 16 | Ht 64.5 in | Wt 133.6 lb

## 2018-04-05 DIAGNOSIS — C9000 Multiple myeloma not having achieved remission: Secondary | ICD-10-CM

## 2018-04-05 DIAGNOSIS — R05 Cough: Secondary | ICD-10-CM

## 2018-04-05 DIAGNOSIS — J189 Pneumonia, unspecified organism: Secondary | ICD-10-CM

## 2018-04-05 DIAGNOSIS — I10 Essential (primary) hypertension: Secondary | ICD-10-CM | POA: Diagnosis not present

## 2018-04-05 DIAGNOSIS — R059 Cough, unspecified: Secondary | ICD-10-CM

## 2018-04-05 NOTE — Progress Notes (Signed)
[facility] [place], [place] [address]  Internal MEDICINE  Office Visit Note  Patient Name: Sherry Oneal  [id number]  [id number]  Date of Service: 04/06/2018   Chief Complaint  Patient presents with  . Hospitalization Follow-up    pneumonia      She continues to cough and wheeze. Initially treated on 3/4 with z-pack. Did not improve. Did 10 day round of levofloxacin. Started her with nebulizer treatments four times daily when needed for coughing and wheezing.She was hospitalized with pneumonia. She states that she has noted some imrpovement, however, she feels like she cannot get over this completely. She is supposed to get chemotherapy treatment tomorrow. Had to miss her last round due to this illness. She will have blood work done tomorrow and see her oncologist regardless of chemotherapy.   Pt is here for recent hospital follow up.Overall feels better  Current Medication: Outpatient Encounter Medications as of 04/05/2018  Medication Sig  . acetaminophen (TYLENOL) 500 MG tablet Take 500 mg by mouth every 4 (four) hours.  Marland Kitchen albuterol (PROVENTIL HFA;VENTOLIN HFA) 108 (90 Base) MCG/ACT inhaler Inhale 2 puffs into the lungs every 6 (six) hours as needed for wheezing or shortness of breath.  Marland Kitchen albuterol (PROVENTIL HFA;VENTOLIN HFA) 108 (90 Base) MCG/ACT inhaler Inhale 2 puffs into the lungs every 6 (six) hours as needed for wheezing or shortness of breath.  . ALPRAZolam (XANAX) 0.25 MG tablet Take 0.25 mg by mouth daily as needed.  Marland Kitchen atorvastatin (LIPITOR) 10 MG tablet Take 10 mg by mouth daily.  . Calcium Carbonate-Vitamin D 600-400 MG-UNIT tablet Take 1 tablet by mouth daily.  . Cranberry 500 MG CAPS Take 500 mg by mouth every morning.  . diphenhydrAMINE (BENADRYL ALLERGY) 25 MG tablet Take 25 mg by mouth daily as needed.  Marland Kitchen ELIQUIS 5 MG TABS tablet Take 1 tablet by mouth 2 (two) times daily.  Marland Kitchen guaiFENesin-codeine 100-10 MG/5ML syrup Take 5 mLs by mouth every  6 (six) hours as needed for cough.  Marland Kitchen ipratropium-albuterol (DUONEB) 0.5-2.5 (3) MG/3ML SOLN Take 3 mLs by nebulization every 6 (six) hours as needed.  Marland Kitchen levofloxacin (LEVAQUIN) 500 MG tablet Take 1 tablet (500 mg total) by mouth daily.  Marland Kitchen lisinopril (PRINIVIL,ZESTRIL) 10 MG tablet Take 10 mg by mouth daily.  . Multiple Vitamins-Minerals (PRESERVISION AREDS PO) Take 1 tablet by mouth every morning.  Marland Kitchen omeprazole (PRILOSEC) 20 MG capsule Take 20 mg by mouth daily.  . ondansetron (ZOFRAN-ODT) 4 MG disintegrating tablet DISSOLVE 1 TABLET IN MOUTH EVERY 8 HOURS AS NEEDED FOR NAUSEA  . polyethylene glycol (MIRALAX / GLYCOLAX) packet Take 17 g by mouth daily as needed.  . pomalidomide (POMALYST) 4 MG capsule Take 1 capsule by mouth once daily for days 1-21. 7 days rest.  . predniSONE (STERAPRED UNI-PAK 21 TAB) 10 MG (21) TBPK tablet 6 day taper - take by mouth as directed for 6 days  . prochlorperazine (COMPAZINE) 5 MG tablet Take 5 mg by mouth every 8 (eight) hours as needed.  . valACYclovir (VALTREX) 500 MG tablet Take 500 mg by mouth daily.   No facility-administered encounter medications on file as of 04/05/2018.     Surgical History: Past Surgical History:  Procedure Laterality Date  . EXPLORATORY LAPAROTOMY      Medical History: Past Medical History:  Diagnosis Date  . Anemia   . High blood pressure   . High cholesterol   . Multiple myeloma ([place])   . Renal insufficiency  Family History: Family History  Problem Relation Age of Onset  . Breast cancer Maternal Grandmother 87  . CAD Mother   . CVA Mother     Social History   Socioeconomic History  . Marital status: Married    Spouse name: Not on file  . Number of children: 1  . Years of education: Not on file  . Highest education level: Not on file  Occupational History  . Not on file  Social Needs  . Financial resource strain: Not hard at all  . Food insecurity:    Worry: Never true    Inability: Never true  .  Transportation needs:    Medical: No    Non-medical: No  Tobacco Use  . Smoking status: Never Smoker  . Smokeless tobacco: Never Used  Substance and Sexual Activity  . Alcohol use: No  . Drug use: No  . Sexual activity: Not Currently    Birth control/protection: None  Lifestyle  . Physical activity:    Days per week: 0 days    Minutes per session: 0 min  . Stress: Only a little  Relationships  . Social connections:    Talks on phone: More than three times a week    Gets together: Three times a week    Attends religious service: 1 to 4 times per year    Active member of club or organization: No    Attends meetings of clubs or organizations: Never    Relationship status: Married  . Intimate partner violence:    Fear of current or ex partner: No    Emotionally abused: No    Physically abused: No    Forced sexual activity: No  Other Topics Concern  . Not on file  Social History Narrative   Lives at home with her husband, independent at baseline   Review of Systems  Constitutional: Negative for chills, diaphoresis and fatigue.  HENT: Negative for ear pain, postnasal drip and sinus pressure.   Eyes: Negative for photophobia, discharge, redness, itching and visual disturbance.  Respiratory: Negative for cough, shortness of breath and wheezing.   Cardiovascular: Negative for chest pain, palpitations and leg swelling.  Gastrointestinal: Negative for abdominal pain, constipation, diarrhea, nausea and vomiting.  Genitourinary: Negative for dysuria and flank pain.  Musculoskeletal: Negative for arthralgias, back pain, gait problem and neck pain.  Skin: Negative for color change.  Allergic/Immunologic: Negative for environmental allergies and food allergies.  Neurological: Negative for dizziness and headaches.  Hematological: Does not bruise/bleed easily.  Psychiatric/Behavioral: Negative for agitation, behavioral problems (depression) and hallucinations.   Vital Signs: BP 128/80  (BP Location: Left Arm, Patient Position: Sitting)   Pulse 76   Resp 16   Ht 5' 4.5" (1.638 m)   Wt 133 lb 9.6 oz (60.6 kg)   SpO2 98%   BMI 22.58 kg/m  Physical Exam  Constitutional: She is oriented to person, place, and time. She appears well-developed and well-nourished. No distress.  HENT:  Head: Normocephalic and atraumatic.  Mouth/Throat: Oropharynx is clear and moist. No oropharyngeal exudate.  Eyes: Pupils are equal, round, and reactive to light. EOM are normal.  Neck: Normal range of motion. Neck supple. No JVD present. No tracheal deviation present. No thyromegaly present.  Cardiovascular: Normal rate, regular rhythm and normal heart sounds. Exam reveals no gallop and no friction rub.  No murmur heard. Pulmonary/Chest: Effort normal. No respiratory distress. She has no wheezes. She has no rales. She exhibits no tenderness.  Abdominal: Soft. Bowel  sounds are normal.  Musculoskeletal: Normal range of motion.  Lymphadenopathy:    She has no cervical adenopathy.  Neurological: She is alert and oriented to person, place, and time. No cranial nerve deficit.  Skin: Skin is warm and dry. She is not diaphoretic.  Psychiatric: She has a normal mood and affect. Her behavior is normal. Judgment and thought content normal.   Assessment/Plan: 1. HCAP (healthcare-associated pneumonia) Follow up - DG Chest 2 View; Future  2. Cough Order - Pulmonary Function Test; Future  3. Multiple myeloma, remission status unspecified ([place]) Per hematology  4. Essential hypertension Controlled   General Counseling: cythia bachtel understanding of the findings of todays visit and agrees with plan of treatment. I have discussed any further diagnostic evaluation that may be needed or ordered today. We also reviewed her medications today. she has been encouraged to call the office with any questions or concerns that should arise related to todays visit.    Orders Placed This Encounter   Procedures  . DG Chest 2 View  . Pulmonary Function Test    I have reviewed all medical records from hospital follow up including radiology reports and consults from other physicians. Appropriate follow up diagnostics will be scheduled as needed. Patient/ Family understands the plan of treatment. Time spent 25 minutes.   Dr Lavera Guise, MD Internal Medicine

## 2018-04-11 ENCOUNTER — Ambulatory Visit (INDEPENDENT_AMBULATORY_CARE_PROVIDER_SITE_OTHER): Payer: Medicare HMO | Admitting: Nurse Practitioner

## 2018-04-11 VITALS — BP 151/69 | HR 84 | Temp 97.5°F | Resp 16 | Ht 64.5 in | Wt 134.6 lb

## 2018-04-11 DIAGNOSIS — R059 Cough, unspecified: Secondary | ICD-10-CM

## 2018-04-11 DIAGNOSIS — R05 Cough: Secondary | ICD-10-CM

## 2018-04-11 DIAGNOSIS — J209 Acute bronchitis, unspecified: Secondary | ICD-10-CM | POA: Diagnosis not present

## 2018-04-11 DIAGNOSIS — C9000 Multiple myeloma not having achieved remission: Secondary | ICD-10-CM | POA: Diagnosis not present

## 2018-04-11 DIAGNOSIS — I10 Essential (primary) hypertension: Secondary | ICD-10-CM

## 2018-04-11 MED ORDER — CEPHALEXIN 500 MG PO CAPS: 500.0000 mg | ORAL_CAPSULE | Freq: Three times a day (TID) | ORAL | 0 refills | Status: DC

## 2018-04-11 MED ORDER — PREDNISONE 10 MG (21) PO TBPK: ORAL_TABLET | ORAL | 0 refills | Status: DC

## 2018-04-11 NOTE — Progress Notes (Signed)
[facility] [place],  [address]  Internal MEDICINE  Office Visit Note  Patient Name: Sherry Oneal  [id number]  [id number]  Date of Service: 05/01/2018    Pt is here for a sick visit.  Chief Complaint  Patient presents with  . Nasal Congestion  . Ear Pain  . Laryngitis      She continues to cough and wheeze. Initially treated on 3/4 with z-pack. Did not improve. Did 10 day round of levofloxacin. Started her with nebulizer treatments four times daily when needed for coughing and wheezing.She was hospitalized with pneumonia. She states that she has noted some imrpovement, however, she feels like she cannot get over this completely. She is supposed to get chemotherapy treatment tomorrow. Had to miss her last round due to this illness. She will have blood work done tomorrow and see her oncologist regardless of chemotherapy.       Current Medication:  Outpatient Encounter Medications as of 04/11/2018  Medication Sig  . acetaminophen (TYLENOL) 500 MG tablet Take 500 mg by mouth every 4 (four) hours.  Marland Kitchen albuterol (PROVENTIL HFA;VENTOLIN HFA) 108 (90 Base) MCG/ACT inhaler Inhale 2 puffs into the lungs every 6 (six) hours as needed for wheezing or shortness of breath.  Marland Kitchen albuterol (PROVENTIL HFA;VENTOLIN HFA) 108 (90 Base) MCG/ACT inhaler Inhale 2 puffs into the lungs every 6 (six) hours as needed for wheezing or shortness of breath.  . ALPRAZolam (XANAX) 0.25 MG tablet Take 0.25 mg by mouth daily as needed.  Marland Kitchen atorvastatin (LIPITOR) 10 MG tablet Take 10 mg by mouth daily.  . Calcium Carbonate-Vitamin D 600-400 MG-UNIT tablet Take 1 tablet by mouth daily.  . cephALEXin (KEFLEX) 500 MG capsule Take 1 capsule (500 mg total) by mouth 3 (three) times daily.  . Cranberry 500 MG CAPS Take 500 mg by mouth every morning.  . diphenhydrAMINE (BENADRYL ALLERGY) 25 MG tablet Take 25 mg by mouth daily as needed.  Marland Kitchen ELIQUIS 5 MG TABS tablet Take 1 tablet by mouth 2  (two) times daily.  Marland Kitchen guaiFENesin-codeine 100-10 MG/5ML syrup Take 5 mLs by mouth every 6 (six) hours as needed for cough.  Marland Kitchen ipratropium-albuterol (DUONEB) 0.5-2.5 (3) MG/3ML SOLN Take 3 mLs by nebulization every 6 (six) hours as needed.  Marland Kitchen levofloxacin (LEVAQUIN) 500 MG tablet Take 1 tablet (500 mg total) by mouth daily.  Marland Kitchen lisinopril (PRINIVIL,ZESTRIL) 10 MG tablet Take 10 mg by mouth daily.  . Multiple Vitamins-Minerals (PRESERVISION AREDS PO) Take 1 tablet by mouth every morning.  Marland Kitchen omeprazole (PRILOSEC) 20 MG capsule Take 20 mg by mouth daily.  . ondansetron (ZOFRAN-ODT) 4 MG disintegrating tablet DISSOLVE 1 TABLET IN MOUTH EVERY 8 HOURS AS NEEDED FOR NAUSEA  . polyethylene glycol (MIRALAX / GLYCOLAX) packet Take 17 g by mouth daily as needed.  . pomalidomide (POMALYST) 4 MG capsule Take 1 capsule by mouth once daily for days 1-21. 7 days rest.  . predniSONE (STERAPRED UNI-PAK 21 TAB) 10 MG (21) TBPK tablet 6 day taper - take by mouth as directed for 6 days  . prochlorperazine (COMPAZINE) 5 MG tablet Take 5 mg by mouth every 8 (eight) hours as needed.  . valACYclovir (VALTREX) 500 MG tablet Take 500 mg by mouth daily.  . [DISCONTINUED] predniSONE (STERAPRED UNI-PAK 21 TAB) 10 MG (21) TBPK tablet 6 day taper - take by mouth as directed for 6 days   No facility-administered encounter medications on file as of 04/11/2018.  Medical History: Past Medical History:  Diagnosis Date  . Anemia   . High blood pressure   . High cholesterol   . Multiple myeloma ([place])   . Renal insufficiency     Today's Vitals   04/11/18 1443  BP: (!) 151/69  Pulse: 84  Resp: 16  Temp: (!) 97.5 F (36.4 C)  SpO2: 97%  Weight: 134 lb 9.6 oz (61.1 kg)  Height: 5' 4.5" (1.638 m)    Review of Systems  Constitutional: Positive for chills and fatigue. Negative for fever.  HENT: Positive for congestion, postnasal drip, rhinorrhea, sinus pressure, sinus pain and sore throat.        Ears feel clogged.  Having a hard time hearing.   Eyes: Negative.   Respiratory: Positive for wheezing. Negative for cough and shortness of breath.   Cardiovascular: Negative for chest pain and palpitations.  Gastrointestinal: Positive for nausea. Negative for vomiting.  Endocrine: Negative for cold intolerance, heat intolerance, polydipsia, polyphagia and polyuria.  Musculoskeletal: Negative for arthralgias.  Skin: Negative for rash.  Allergic/Immunologic: Positive for environmental allergies.  Neurological: Positive for headaches.  Hematological: Positive for adenopathy.  Psychiatric/Behavioral: Negative for behavioral problems. The patient is not nervous/anxious.     Physical Exam  Constitutional: She is oriented to person, place, and time. She appears well-developed and well-nourished. She appears ill. No distress.  HENT:  Head: Normocephalic and atraumatic.  Right Ear: Tympanic membrane is bulging.  Left Ear: Tympanic membrane is bulging.  Nose: Rhinorrhea present. Right sinus exhibits maxillary sinus tenderness and frontal sinus tenderness. Left sinus exhibits maxillary sinus tenderness and frontal sinus tenderness.  Mouth/Throat: Posterior oropharyngeal erythema present. No oropharyngeal exudate.  Eyes: Pupils are equal, round, and reactive to light. EOM are normal.  Neck: Normal range of motion. Neck supple. No JVD present. No tracheal deviation present. No thyromegaly present.  Cardiovascular: Normal rate, regular rhythm and normal heart sounds. Exam reveals no gallop and no friction rub.  No murmur heard. Pulmonary/Chest: Effort normal. No respiratory distress. She has no wheezes. She has no rales. She exhibits no tenderness.  Very slight congestion auscultate in left lower lobe of the lungs. Breath sounds are, otherwise, clear. She has harsh, dry cough. Symptoms have improved some since initial visit, but they are not resolved.   Abdominal: Soft. Bowel sounds are normal. There is no tenderness.   Musculoskeletal: Normal range of motion.  Lymphadenopathy:    She has no cervical adenopathy.  Neurological: She is alert and oriented to person, place, and time. No cranial nerve deficit.  Skin: Skin is warm and dry. She is not diaphoretic.  Psychiatric: She has a normal mood and affect. Her behavior is normal. Judgment and thought content normal.  Nursing note and vitals reviewed.   Assessment/Plan: 1. Acute bronchitis, unspecified organism Start cephalexin 567m three times daily for next 10 days. Rest and increase fluids. Encourage her to discuss symptoms with oncology on Wednesday.  - cephALEXin (KEFLEX) 500 MG capsule; Take 1 capsule (500 mg total) by mouth 3 (three) times daily.  Dispense: 30 capsule; Refill: 0 - predniSONE (STERAPRED UNI-PAK 21 TAB) 10 MG (21) TBPK tablet; 6 day taper - take by mouth as directed for 6 days  Dispense: 21 tablet; Refill: 0  2. Cough Use inhaler and respiratory medication as needed and as prescribed.   3. Essential hypertension Generally stable. Continue bp medication as prescribed.   4. Multiple myeloma, remission status unspecified ([facility]) Regular visits with oncology as scheduled.   General  Counseling: darnita woodrum understanding of the findings of todays visit and agrees with plan of treatment. I have discussed any further diagnostic evaluation that may be needed or ordered today. We also reviewed her medications today. she has been encouraged to call the office with any questions or concerns that should arise related to todays visit.  Rest and increase fluids. Continue using OTC medication to control symptoms.   This patient was seen by Leretha Pol, FNP- C in Collaboration with Dr Lavera Guise as a part of collaborative care agreement    Meds ordered this encounter  Medications  . cephALEXin (KEFLEX) 500 MG capsule    Sig: Take 1 capsule (500 mg total) by mouth 3 (three) times daily.    Dispense:  30 capsule    Refill:  0     Order Specific Question:   Supervising Provider    Answer:   Lavera Guise [[id number]]  . predniSONE (STERAPRED UNI-PAK 21 TAB) 10 MG (21) TBPK tablet    Sig: 6 day taper - take by mouth as directed for 6 days    Dispense:  21 tablet    Refill:  0    Order Specific Question:   Supervising Provider    Answer:   Lavera Guise [[id number]]    Time spent: 15 Minutes

## 2018-04-12 DIAGNOSIS — E041 Nontoxic single thyroid nodule: Secondary | ICD-10-CM | POA: Diagnosis not present

## 2018-04-12 DIAGNOSIS — C9001 Multiple myeloma in remission: Secondary | ICD-10-CM | POA: Diagnosis not present

## 2018-04-12 DIAGNOSIS — J189 Pneumonia, unspecified organism: Secondary | ICD-10-CM | POA: Diagnosis not present

## 2018-04-12 DIAGNOSIS — J40 Bronchitis, not specified as acute or chronic: Secondary | ICD-10-CM | POA: Diagnosis not present

## 2018-04-12 DIAGNOSIS — R0609 Other forms of dyspnea: Secondary | ICD-10-CM | POA: Diagnosis not present

## 2018-04-13 DIAGNOSIS — J019 Acute sinusitis, unspecified: Secondary | ICD-10-CM | POA: Diagnosis not present

## 2018-04-13 DIAGNOSIS — H6503 Acute serous otitis media, bilateral: Secondary | ICD-10-CM | POA: Diagnosis not present

## 2018-04-13 DIAGNOSIS — H902 Conductive hearing loss, unspecified: Secondary | ICD-10-CM | POA: Diagnosis not present

## 2018-04-13 DIAGNOSIS — H6983 Other specified disorders of Eustachian tube, bilateral: Secondary | ICD-10-CM | POA: Diagnosis not present

## 2018-04-20 ENCOUNTER — Ambulatory Visit: Payer: Self-pay | Admitting: Internal Medicine

## 2018-04-20 DIAGNOSIS — M8588 Other specified disorders of bone density and structure, other site: Secondary | ICD-10-CM | POA: Diagnosis not present

## 2018-04-20 DIAGNOSIS — Z5112 Encounter for antineoplastic immunotherapy: Secondary | ICD-10-CM | POA: Diagnosis not present

## 2018-04-20 DIAGNOSIS — H9190 Unspecified hearing loss, unspecified ear: Secondary | ICD-10-CM | POA: Diagnosis not present

## 2018-04-20 DIAGNOSIS — Z882 Allergy status to sulfonamides status: Secondary | ICD-10-CM | POA: Diagnosis not present

## 2018-04-20 DIAGNOSIS — D61810 Antineoplastic chemotherapy induced pancytopenia: Secondary | ICD-10-CM | POA: Diagnosis not present

## 2018-04-20 DIAGNOSIS — R110 Nausea: Secondary | ICD-10-CM | POA: Diagnosis not present

## 2018-04-20 DIAGNOSIS — R0609 Other forms of dyspnea: Secondary | ICD-10-CM | POA: Diagnosis not present

## 2018-04-20 DIAGNOSIS — Z886 Allergy status to analgesic agent status: Secondary | ICD-10-CM | POA: Diagnosis not present

## 2018-04-20 DIAGNOSIS — R5383 Other fatigue: Secondary | ICD-10-CM | POA: Diagnosis not present

## 2018-04-20 DIAGNOSIS — C9001 Multiple myeloma in remission: Secondary | ICD-10-CM | POA: Diagnosis not present

## 2018-04-20 DIAGNOSIS — J329 Chronic sinusitis, unspecified: Secondary | ICD-10-CM | POA: Diagnosis not present

## 2018-04-25 DIAGNOSIS — R69 Illness, unspecified: Secondary | ICD-10-CM | POA: Diagnosis not present

## 2018-04-27 ENCOUNTER — Ambulatory Visit: Payer: Self-pay | Admitting: Internal Medicine

## 2018-05-01 ENCOUNTER — Encounter: Payer: Self-pay | Admitting: Nurse Practitioner

## 2018-05-11 ENCOUNTER — Ambulatory Visit (INDEPENDENT_AMBULATORY_CARE_PROVIDER_SITE_OTHER): Payer: Medicare HMO | Admitting: Internal Medicine

## 2018-05-11 DIAGNOSIS — R0602 Shortness of breath: Secondary | ICD-10-CM

## 2018-05-11 LAB — PULMONARY FUNCTION TEST

## 2018-05-17 DIAGNOSIS — R110 Nausea: Secondary | ICD-10-CM | POA: Diagnosis not present

## 2018-05-17 DIAGNOSIS — E041 Nontoxic single thyroid nodule: Secondary | ICD-10-CM | POA: Diagnosis not present

## 2018-05-17 DIAGNOSIS — Z7901 Long term (current) use of anticoagulants: Secondary | ICD-10-CM | POA: Diagnosis not present

## 2018-05-17 DIAGNOSIS — Z5112 Encounter for antineoplastic immunotherapy: Secondary | ICD-10-CM | POA: Diagnosis not present

## 2018-05-17 DIAGNOSIS — C9001 Multiple myeloma in remission: Secondary | ICD-10-CM | POA: Diagnosis not present

## 2018-05-17 DIAGNOSIS — C9000 Multiple myeloma not having achieved remission: Secondary | ICD-10-CM | POA: Diagnosis not present

## 2018-05-17 DIAGNOSIS — Z7983 Long term (current) use of bisphosphonates: Secondary | ICD-10-CM | POA: Diagnosis not present

## 2018-05-17 DIAGNOSIS — Z882 Allergy status to sulfonamides status: Secondary | ICD-10-CM | POA: Diagnosis not present

## 2018-05-17 DIAGNOSIS — Z91041 Radiographic dye allergy status: Secondary | ICD-10-CM | POA: Diagnosis not present

## 2018-05-17 DIAGNOSIS — D61810 Antineoplastic chemotherapy induced pancytopenia: Secondary | ICD-10-CM | POA: Diagnosis not present

## 2018-05-17 DIAGNOSIS — Z79899 Other long term (current) drug therapy: Secondary | ICD-10-CM | POA: Diagnosis not present

## 2018-05-17 DIAGNOSIS — R0609 Other forms of dyspnea: Secondary | ICD-10-CM | POA: Diagnosis not present

## 2018-05-19 ENCOUNTER — Other Ambulatory Visit: Payer: Self-pay

## 2018-05-19 MED ORDER — ATORVASTATIN CALCIUM 10 MG PO TABS: 10.0000 mg | ORAL_TABLET | Freq: Every day | ORAL | 3 refills | Status: DC

## 2018-05-20 ENCOUNTER — Ambulatory Visit
Admission: RE | Admit: 2018-05-20 | Discharge: 2018-05-20 | Disposition: A | Discharge status: Home / Self Care | Payer: Medicare HMO | Source: Ambulatory Visit | Attending: Internal Medicine | Admitting: Internal Medicine

## 2018-05-20 DIAGNOSIS — J189 Pneumonia, unspecified organism: Secondary | ICD-10-CM

## 2018-05-24 NOTE — Procedures (Signed)
[facility] [place], [address]  DATE OF SERVICE: May 11, 2018  Complete Pulmonary Function Testing Interpretation:  FINDINGS:  The forced vital capacity is normal the FEV1 is normal FEV1 FVC ratio is within normal limits.  Total lung capacity is normal residual volume is decreased residual volume total lung capacity ratio is decreased FRC is increased.  The DLCO is mildly decreased.  Postbronchodilator there was no significant improvement in the FEV1 however clinical improvement may occur in the absence of spirometric improvement and does not preclude the use of bronchodilators.  IMPRESSION:  Spirometry and lung volumes are within normal limits. DLCO is mildly decreased clinical correlation is recommended.  Allyne Gee, MD [facility] Pulmonary Critical Care Medicine Sleep Medicine

## 2018-05-24 NOTE — Progress Notes (Signed)
Pt was notified.  

## 2018-05-27 ENCOUNTER — Encounter: Payer: Self-pay | Admitting: Nurse Practitioner

## 2018-05-27 ENCOUNTER — Ambulatory Visit (INDEPENDENT_AMBULATORY_CARE_PROVIDER_SITE_OTHER): Payer: Medicare HMO | Admitting: Nurse Practitioner

## 2018-05-27 VITALS — BP 122/50 | HR 84 | Resp 16 | Ht 64.5 in | Wt 134.2 lb

## 2018-05-27 DIAGNOSIS — J209 Acute bronchitis, unspecified: Secondary | ICD-10-CM | POA: Diagnosis not present

## 2018-05-27 DIAGNOSIS — C9000 Multiple myeloma not having achieved remission: Secondary | ICD-10-CM

## 2018-05-27 DIAGNOSIS — F411 Generalized anxiety disorder: Secondary | ICD-10-CM | POA: Diagnosis not present

## 2018-05-27 DIAGNOSIS — I10 Essential (primary) hypertension: Secondary | ICD-10-CM | POA: Diagnosis not present

## 2018-05-27 DIAGNOSIS — R69 Illness, unspecified: Secondary | ICD-10-CM | POA: Diagnosis not present

## 2018-05-27 MED ORDER — ALPRAZOLAM 0.25 MG PO TABS: 0.2500 mg | ORAL_TABLET | Freq: Every evening | ORAL | 2 refills | Status: DC | PRN

## 2018-05-27 NOTE — Progress Notes (Signed)
[facility] [place], [place] [address]  Internal MEDICINE  Office Visit Note  Patient Name: Sherry Oneal  [id number]  [id number]  Date of Service: 06/13/2018    Pt is here for routine follow up.   Chief Complaint  Patient presents with  . Results    breathing test results     The patient had long bout with bronchitis/pneumonia. Had multiple rounds of antibiotics and oral steroids. Was hospitalized for two days in March due to this infection. Through this, she missed one round of chemotherapy for multiple myeloma. She continues to have monthly treatments. Has mostly recovered from respiratory infection. She did have chest x-ray showing clearing of pulmonary infiltrate. Also had PFT which was essentially normal.      Current Medication: Outpatient Encounter Medications as of 05/27/2018  Medication Sig  . acetaminophen (TYLENOL) 500 MG tablet Take 500 mg by mouth every 4 (four) hours.  Marland Kitchen albuterol (PROVENTIL HFA;VENTOLIN HFA) 108 (90 Base) MCG/ACT inhaler Inhale 2 puffs into the lungs every 6 (six) hours as needed for wheezing or shortness of breath.  Marland Kitchen albuterol (PROVENTIL HFA;VENTOLIN HFA) 108 (90 Base) MCG/ACT inhaler Inhale 2 puffs into the lungs every 6 (six) hours as needed for wheezing or shortness of breath.  . ALPRAZolam (XANAX) 0.25 MG tablet Take 1 tablet (0.25 mg total) by mouth at bedtime as needed.  Marland Kitchen atorvastatin (LIPITOR) 10 MG tablet Take 1 tablet (10 mg total) by mouth daily.  . Calcium Carbonate-Vitamin D 600-400 MG-UNIT tablet Take 1 tablet by mouth daily.  . cephALEXin (KEFLEX) 500 MG capsule Take 1 capsule (500 mg total) by mouth 3 (three) times daily.  . Cranberry 500 MG CAPS Take 500 mg by mouth every morning.  . diphenhydrAMINE (BENADRYL ALLERGY) 25 MG tablet Take 25 mg by mouth daily as needed.  Marland Kitchen ELIQUIS 5 MG TABS tablet Take 1 tablet by mouth 2 (two) times daily.  Marland Kitchen guaiFENesin-codeine 100-10 MG/5ML syrup Take 5 mLs by mouth every  6 (six) hours as needed for cough.  Marland Kitchen ipratropium-albuterol (DUONEB) 0.5-2.5 (3) MG/3ML SOLN Take 3 mLs by nebulization every 6 (six) hours as needed.  Marland Kitchen levofloxacin (LEVAQUIN) 500 MG tablet Take 1 tablet (500 mg total) by mouth daily.  Marland Kitchen lisinopril (PRINIVIL,ZESTRIL) 10 MG tablet Take 10 mg by mouth daily.  . Multiple Vitamins-Minerals (PRESERVISION AREDS PO) Take 1 tablet by mouth every morning.  Marland Kitchen omeprazole (PRILOSEC) 20 MG capsule Take 20 mg by mouth daily.  . ondansetron (ZOFRAN-ODT) 4 MG disintegrating tablet DISSOLVE 1 TABLET IN MOUTH EVERY 8 HOURS AS NEEDED FOR NAUSEA  . polyethylene glycol (MIRALAX / GLYCOLAX) packet Take 17 g by mouth daily as needed.  . pomalidomide (POMALYST) 4 MG capsule Take 1 capsule by mouth once daily for days 1-21. 7 days rest.  . predniSONE (STERAPRED UNI-PAK 21 TAB) 10 MG (21) TBPK tablet 6 day taper - take by mouth as directed for 6 days  . prochlorperazine (COMPAZINE) 5 MG tablet Take 5 mg by mouth every 8 (eight) hours as needed.  . valACYclovir (VALTREX) 500 MG tablet Take 500 mg by mouth daily.  . [DISCONTINUED] ALPRAZolam (XANAX) 0.25 MG tablet Take 0.25 mg by mouth daily as needed.  . [DISCONTINUED] atorvastatin (LIPITOR) 10 MG tablet Take 10 mg by mouth daily.  . [DISCONTINUED] azithromycin (ZITHROMAX) 250 MG tablet z-pack - take as directed for 5 days   No facility-administered encounter medications on file as of 05/27/2018.     Surgical  History: Past Surgical History:  Procedure Laterality Date  . EXPLORATORY LAPAROTOMY      Medical History: Past Medical History:  Diagnosis Date  . Anemia   . High blood pressure   . High cholesterol   . Multiple myeloma ([place])   . Renal insufficiency     Family History: Family History  Problem Relation Age of Onset  . Breast cancer Maternal Grandmother 59  . CAD Mother   . CVA Mother     Social History   Socioeconomic History  . Marital status: Married    Spouse name: Not on file  . Number  of children: 1  . Years of education: Not on file  . Highest education level: Not on file  Occupational History  . Not on file  Social Needs  . Financial resource strain: Not hard at all  . Food insecurity:    Worry: Never true    Inability: Never true  . Transportation needs:    Medical: No    Non-medical: No  Tobacco Use  . Smoking status: Never Smoker  . Smokeless tobacco: Never Used  Substance and Sexual Activity  . Alcohol use: No  . Drug use: No  . Sexual activity: Not Currently    Birth control/protection: None  Lifestyle  . Physical activity:    Days per week: 0 days    Minutes per session: 0 min  . Stress: Only a little  Relationships  . Social connections:    Talks on phone: More than three times a week    Gets together: Three times a week    Attends religious service: 1 to 4 times per year    Active member of club or organization: No    Attends meetings of clubs or organizations: Never    Relationship status: Married  . Intimate partner violence:    Fear of current or ex partner: No    Emotionally abused: No    Physically abused: No    Forced sexual activity: No  Other Topics Concern  . Not on file  Social History Narrative   Lives at home with her husband, independent at baseline      Review of Systems  Constitutional: Positive for chills and fatigue. Negative for fever.       Ears still feel a little clogged, but much better than at prior visits.   HENT: Positive for congestion, postnasal drip and rhinorrhea. Negative for sinus pressure, sinus pain and sore throat.        Ears feel clogged. Having a hard time hearing.   Eyes: Negative.   Respiratory: Positive for cough. Negative for shortness of breath and wheezing.   Cardiovascular: Negative for chest pain and palpitations.  Gastrointestinal: Negative for nausea and vomiting.  Endocrine: Negative for cold intolerance, heat intolerance, polydipsia, polyphagia and polyuria.  Musculoskeletal:  Positive for arthralgias and myalgias. Negative for back pain.  Skin: Negative for rash.  Allergic/Immunologic: Positive for environmental allergies.  Neurological: Positive for headaches.  Hematological: Negative for adenopathy.  Psychiatric/Behavioral: Negative for behavioral problems. The patient is not nervous/anxious.     Today's Vitals   05/27/18 0935  BP: (!) 122/50  Pulse: 84  Resp: 16  SpO2: 95%  Weight: 134 lb 3.2 oz (60.9 kg)  Height: 5' 4.5" (1.638 m)    Physical Exam  Constitutional: She is oriented to person, place, and time. She appears well-developed and well-nourished. She appears ill. No distress.  HENT:  Head: Normocephalic and atraumatic.  Right  Ear: Tympanic membrane is not bulging.  Left Ear: Tympanic membrane is not bulging.  Nose: No rhinorrhea. Right sinus exhibits no maxillary sinus tenderness and no frontal sinus tenderness. Left sinus exhibits no maxillary sinus tenderness and no frontal sinus tenderness.  Mouth/Throat: No oropharyngeal exudate or posterior oropharyngeal erythema.  Eyes: Pupils are equal, round, and reactive to light. EOM are normal.  Neck: Normal range of motion. Neck supple. No JVD present. No tracheal deviation present. No thyromegaly present.  Cardiovascular: Normal rate, regular rhythm and normal heart sounds. Exam reveals no gallop and no friction rub.  No murmur heard. Pulmonary/Chest: Effort normal and breath sounds normal. No respiratory distress. She has no wheezes. She has no rales. She exhibits no tenderness.  Abdominal: Soft. Bowel sounds are normal. There is no tenderness.  Musculoskeletal: Normal range of motion.  Lymphadenopathy:    She has no cervical adenopathy.  Neurological: She is alert and oriented to person, place, and time. No cranial nerve deficit.  Skin: Skin is warm and dry. She is not diaphoretic.  Psychiatric: She has a normal mood and affect. Her behavior is normal. Judgment and thought content normal.   Nursing note and vitals reviewed.  Assessment/Plan: 1. Acute bronchitis, unspecified organism resolved  2. Generalized anxiety disorder May continue alprazolam 0.61m at bedtime as needed for anxiety/insomnia. New prescription sent today.  - ALPRAZolam (XANAX) 0.25 MG tablet; Take 1 tablet (0.25 mg total) by mouth at bedtime as needed.  Dispense: 30 tablet; Refill: 2  3. Essential hypertension Well managed. Continue bp medication as prescribed.   4. Multiple myeloma, remission status unspecified ([place] Continue regular visits with oncology and treatments as scheduled.   General Counseling: Lyezenia fredrickunderstanding of the findings of todays visit and agrees with plan of treatment. I have discussed any further diagnostic evaluation that may be needed or ordered today. We also reviewed her medications today. she has been encouraged to call the office with any questions or concerns that should arise related to todays visit.    Counseling:  This patient was seen by HLeretha Pol FNP- C in Collaboration with Dr FLavera Guiseas a part of collaborative care agreement  Meds ordered this encounter  Medications  . ALPRAZolam (XANAX) 0.25 MG tablet    Sig: Take 1 tablet (0.25 mg total) by mouth at bedtime as needed.    Dispense:  30 tablet    Refill:  2    Order Specific Question:   Supervising Provider    Answer:   KLavera Guise[[[id number]   Time spent: 164Minutes    Dr FLavera GuiseInternal medicine

## 2018-06-03 DIAGNOSIS — H2513 Age-related nuclear cataract, bilateral: Secondary | ICD-10-CM | POA: Diagnosis not present

## 2018-06-03 DIAGNOSIS — H353132 Nonexudative age-related macular degeneration, bilateral, intermediate dry stage: Secondary | ICD-10-CM | POA: Diagnosis not present

## 2018-06-13 DIAGNOSIS — F411 Generalized anxiety disorder: Secondary | ICD-10-CM | POA: Insufficient documentation

## 2018-06-14 DIAGNOSIS — J189 Pneumonia, unspecified organism: Secondary | ICD-10-CM | POA: Diagnosis not present

## 2018-06-14 DIAGNOSIS — C9000 Multiple myeloma not having achieved remission: Secondary | ICD-10-CM | POA: Diagnosis not present

## 2018-06-14 DIAGNOSIS — Z7901 Long term (current) use of anticoagulants: Secondary | ICD-10-CM | POA: Diagnosis not present

## 2018-06-14 DIAGNOSIS — R0609 Other forms of dyspnea: Secondary | ICD-10-CM | POA: Diagnosis not present

## 2018-06-14 DIAGNOSIS — J40 Bronchitis, not specified as acute or chronic: Secondary | ICD-10-CM | POA: Diagnosis not present

## 2018-06-14 DIAGNOSIS — E041 Nontoxic single thyroid nodule: Secondary | ICD-10-CM | POA: Diagnosis not present

## 2018-06-14 DIAGNOSIS — C9001 Multiple myeloma in remission: Secondary | ICD-10-CM | POA: Diagnosis not present

## 2018-06-14 DIAGNOSIS — H9190 Unspecified hearing loss, unspecified ear: Secondary | ICD-10-CM | POA: Diagnosis not present

## 2018-06-14 DIAGNOSIS — Z5112 Encounter for antineoplastic immunotherapy: Secondary | ICD-10-CM | POA: Diagnosis not present

## 2018-06-14 DIAGNOSIS — J040 Acute laryngitis: Secondary | ICD-10-CM | POA: Diagnosis not present

## 2018-06-14 DIAGNOSIS — J329 Chronic sinusitis, unspecified: Secondary | ICD-10-CM | POA: Diagnosis not present

## 2018-06-14 DIAGNOSIS — D61810 Antineoplastic chemotherapy induced pancytopenia: Secondary | ICD-10-CM | POA: Diagnosis not present

## 2018-06-16 ENCOUNTER — Other Ambulatory Visit: Payer: Self-pay

## 2018-06-16 MED ORDER — LISINOPRIL 10 MG PO TABS: 10.0000 mg | ORAL_TABLET | Freq: Every day | ORAL | 1 refills | Status: DC

## 2018-07-13 DIAGNOSIS — Z880 Allergy status to penicillin: Secondary | ICD-10-CM | POA: Diagnosis not present

## 2018-07-13 DIAGNOSIS — D61810 Antineoplastic chemotherapy induced pancytopenia: Secondary | ICD-10-CM | POA: Diagnosis not present

## 2018-07-13 DIAGNOSIS — Z5111 Encounter for antineoplastic chemotherapy: Secondary | ICD-10-CM | POA: Diagnosis not present

## 2018-07-13 DIAGNOSIS — G8929 Other chronic pain: Secondary | ICD-10-CM | POA: Diagnosis not present

## 2018-07-13 DIAGNOSIS — C9000 Multiple myeloma not having achieved remission: Secondary | ICD-10-CM | POA: Diagnosis not present

## 2018-07-13 DIAGNOSIS — M5137 Other intervertebral disc degeneration, lumbosacral region: Secondary | ICD-10-CM | POA: Diagnosis not present

## 2018-07-13 DIAGNOSIS — R0609 Other forms of dyspnea: Secondary | ICD-10-CM | POA: Diagnosis not present

## 2018-07-13 DIAGNOSIS — C9001 Multiple myeloma in remission: Secondary | ICD-10-CM | POA: Diagnosis not present

## 2018-07-13 DIAGNOSIS — E859 Amyloidosis, unspecified: Secondary | ICD-10-CM | POA: Diagnosis not present

## 2018-07-13 DIAGNOSIS — E785 Hyperlipidemia, unspecified: Secondary | ICD-10-CM | POA: Diagnosis not present

## 2018-07-13 DIAGNOSIS — M160 Bilateral primary osteoarthritis of hip: Secondary | ICD-10-CM | POA: Diagnosis not present

## 2018-07-13 DIAGNOSIS — Z886 Allergy status to analgesic agent status: Secondary | ICD-10-CM | POA: Diagnosis not present

## 2018-07-13 DIAGNOSIS — I10 Essential (primary) hypertension: Secondary | ICD-10-CM | POA: Diagnosis not present

## 2018-07-13 DIAGNOSIS — Z882 Allergy status to sulfonamides status: Secondary | ICD-10-CM | POA: Diagnosis not present

## 2018-07-13 DIAGNOSIS — Z885 Allergy status to narcotic agent status: Secondary | ICD-10-CM | POA: Diagnosis not present

## 2018-07-13 DIAGNOSIS — M545 Low back pain: Secondary | ICD-10-CM | POA: Diagnosis not present

## 2018-07-28 DIAGNOSIS — G8929 Other chronic pain: Secondary | ICD-10-CM | POA: Diagnosis not present

## 2018-07-28 DIAGNOSIS — M545 Low back pain: Secondary | ICD-10-CM | POA: Diagnosis not present

## 2018-08-01 DIAGNOSIS — M5136 Other intervertebral disc degeneration, lumbar region: Secondary | ICD-10-CM | POA: Diagnosis not present

## 2018-08-01 DIAGNOSIS — M47816 Spondylosis without myelopathy or radiculopathy, lumbar region: Secondary | ICD-10-CM | POA: Diagnosis not present

## 2018-08-03 DIAGNOSIS — H2511 Age-related nuclear cataract, right eye: Secondary | ICD-10-CM | POA: Diagnosis not present

## 2018-08-09 DIAGNOSIS — E041 Nontoxic single thyroid nodule: Secondary | ICD-10-CM | POA: Diagnosis not present

## 2018-08-09 DIAGNOSIS — R29818 Other symptoms and signs involving the nervous system: Secondary | ICD-10-CM | POA: Diagnosis not present

## 2018-08-09 DIAGNOSIS — M5137 Other intervertebral disc degeneration, lumbosacral region: Secondary | ICD-10-CM | POA: Diagnosis not present

## 2018-08-09 DIAGNOSIS — C9001 Multiple myeloma in remission: Secondary | ICD-10-CM | POA: Diagnosis not present

## 2018-08-09 DIAGNOSIS — R530 Neoplastic (malignant) related fatigue: Secondary | ICD-10-CM | POA: Diagnosis not present

## 2018-08-09 DIAGNOSIS — N281 Cyst of kidney, acquired: Secondary | ICD-10-CM | POA: Diagnosis not present

## 2018-08-09 DIAGNOSIS — M545 Low back pain: Secondary | ICD-10-CM | POA: Diagnosis not present

## 2018-08-09 DIAGNOSIS — C9000 Multiple myeloma not having achieved remission: Secondary | ICD-10-CM | POA: Diagnosis not present

## 2018-08-09 DIAGNOSIS — Z5112 Encounter for antineoplastic immunotherapy: Secondary | ICD-10-CM | POA: Diagnosis not present

## 2018-08-09 DIAGNOSIS — R0609 Other forms of dyspnea: Secondary | ICD-10-CM | POA: Diagnosis not present

## 2018-08-09 DIAGNOSIS — D61810 Antineoplastic chemotherapy induced pancytopenia: Secondary | ICD-10-CM | POA: Diagnosis not present

## 2018-08-09 DIAGNOSIS — M1612 Unilateral primary osteoarthritis, left hip: Secondary | ICD-10-CM | POA: Diagnosis not present

## 2018-08-10 ENCOUNTER — Other Ambulatory Visit: Payer: Self-pay

## 2018-08-10 ENCOUNTER — Encounter: Payer: Self-pay | Admitting: *Deleted

## 2018-08-11 NOTE — Discharge Instructions (Signed)

## 2018-08-17 ENCOUNTER — Ambulatory Visit
Admission: RE | Admit: 2018-08-17 | Discharge: 2018-08-17 | Disposition: A | Discharge status: Home / Self Care | Payer: Medicare HMO | Source: Ambulatory Visit | Attending: Ophthalmology | Admitting: Ophthalmology

## 2018-08-17 ENCOUNTER — Encounter
Admission: RE | Disposition: A | Discharge status: Home / Self Care | Payer: Self-pay | Source: Ambulatory Visit | Attending: Ophthalmology

## 2018-08-17 ENCOUNTER — Ambulatory Visit: Payer: Medicare HMO | Admitting: Anesthesiology

## 2018-08-17 DIAGNOSIS — Z7901 Long term (current) use of anticoagulants: Secondary | ICD-10-CM | POA: Insufficient documentation

## 2018-08-17 DIAGNOSIS — Z85828 Personal history of other malignant neoplasm of skin: Secondary | ICD-10-CM | POA: Diagnosis not present

## 2018-08-17 DIAGNOSIS — Z8701 Personal history of pneumonia (recurrent): Secondary | ICD-10-CM | POA: Diagnosis not present

## 2018-08-17 DIAGNOSIS — H2511 Age-related nuclear cataract, right eye: Secondary | ICD-10-CM | POA: Insufficient documentation

## 2018-08-17 DIAGNOSIS — Z79899 Other long term (current) drug therapy: Secondary | ICD-10-CM | POA: Diagnosis not present

## 2018-08-17 DIAGNOSIS — E7800 Pure hypercholesterolemia, unspecified: Secondary | ICD-10-CM | POA: Diagnosis not present

## 2018-08-17 DIAGNOSIS — K219 Gastro-esophageal reflux disease without esophagitis: Secondary | ICD-10-CM | POA: Insufficient documentation

## 2018-08-17 DIAGNOSIS — H25811 Combined forms of age-related cataract, right eye: Secondary | ICD-10-CM | POA: Diagnosis not present

## 2018-08-17 DIAGNOSIS — M1990 Unspecified osteoarthritis, unspecified site: Secondary | ICD-10-CM | POA: Diagnosis not present

## 2018-08-17 DIAGNOSIS — I10 Essential (primary) hypertension: Secondary | ICD-10-CM | POA: Diagnosis not present

## 2018-08-17 HISTORY — PX: CATARACT EXTRACTION W/PHACO: SHX586

## 2018-08-17 HISTORY — DX: Presence of dental prosthetic device (complete) (partial): Z97.2

## 2018-08-17 HISTORY — DX: Unspecified osteoarthritis, unspecified site: M19.90

## 2018-08-17 SURGERY — PHACOEMULSIFICATION, CATARACT, WITH IOL INSERTION
Anesthesia: Monitor Anesthesia Care | Site: Eye | Laterality: Right | Wound class: "Clean "

## 2018-08-17 MED ORDER — LIDOCAINE HCL (PF) 2 % IJ SOLN
INTRAOCULAR | Status: DC | PRN
  Administered 2018-08-17: 1 mL

## 2018-08-17 MED ORDER — BRIMONIDINE TARTRATE-TIMOLOL 0.2-0.5 % OP SOLN
OPHTHALMIC | Status: DC | PRN
  Administered 2018-08-17: 1 [drp] via OPHTHALMIC

## 2018-08-17 MED ORDER — NA HYALUR & NA CHOND-NA HYALUR 0.4-0.35 ML IO KIT
PACK | INTRAOCULAR | Status: DC | PRN
  Administered 2018-08-17: 1 mL via INTRAOCULAR

## 2018-08-17 MED ORDER — LACTATED RINGERS IV SOLN: INTRAVENOUS | Status: DC

## 2018-08-17 MED ORDER — ACETAMINOPHEN 160 MG/5ML PO SOLN: 325.0000 mg | Freq: Once | ORAL | Status: DC

## 2018-08-17 MED ORDER — EPINEPHRINE PF 1 MG/ML IJ SOLN
INTRAOCULAR | Status: DC | PRN
  Administered 2018-08-17: 70 mL via OPHTHALMIC

## 2018-08-17 MED ORDER — ACETAMINOPHEN 325 MG PO TABS: 325.0000 mg | ORAL_TABLET | Freq: Once | ORAL | Status: DC

## 2018-08-17 MED ORDER — MIDAZOLAM HCL 2 MG/2ML IJ SOLN
INTRAMUSCULAR | Status: DC | PRN
  Administered 2018-08-17: 1 mg via INTRAVENOUS

## 2018-08-17 MED ORDER — ARMC OPHTHALMIC DILATING DROPS
1.0000 "application " | OPHTHALMIC | Status: DC | PRN
  Administered 2018-08-17 (×3): 1 via OPHTHALMIC

## 2018-08-17 MED ORDER — FENTANYL CITRATE (PF) 100 MCG/2ML IJ SOLN
INTRAMUSCULAR | Status: DC | PRN
  Administered 2018-08-17: 50 ug via INTRAVENOUS

## 2018-08-17 MED ORDER — MOXIFLOXACIN HCL 0.5 % OP SOLN
1.0000 [drp] | OPHTHALMIC | Status: DC | PRN
  Administered 2018-08-17 (×3): 1 [drp] via OPHTHALMIC

## 2018-08-17 MED ORDER — MOXIFLOXACIN HCL 0.5 % OP SOLN
OPHTHALMIC | Status: DC | PRN
  Administered 2018-08-17: 0.2 mL via OPHTHALMIC

## 2018-08-17 SURGICAL SUPPLY — 27 items

## 2018-08-17 NOTE — H&P (Signed)
The History and Physical notes are on paper, have been signed, and are to be scanned. The patient remains stable and unchanged from the H&P.   Previous H&P reviewed, patient examined, and there are no changes.  Marion Seese 08/17/2018 9:12 AM

## 2018-08-17 NOTE — Anesthesia Postprocedure Evaluation (Signed)
Anesthesia Post Note  Patient: Sherry Oneal  Procedure(s) Performed: CATARACT EXTRACTION PHACO AND INTRAOCULAR LENS PLACEMENT ([place])  RIGHT (Right Eye)  Patient location during evaluation: PACU Anesthesia Type: MAC Level of consciousness: awake and alert and oriented Pain management: satisfactory to patient Vital Signs Assessment: post-procedure vital signs reviewed and stable Respiratory status: spontaneous breathing, nonlabored ventilation and respiratory function stable Cardiovascular status: blood pressure returned to baseline and stable Postop Assessment: Adequate PO intake and No signs of nausea or vomiting Anesthetic complications: no    Raliegh Ip

## 2018-08-17 NOTE — Anesthesia Preprocedure Evaluation (Signed)
Anesthesia Evaluation  Patient identified by MRN, date of birth, ID band Patient awake    Reviewed: Allergy & Precautions, H&P , NPO status , Patient's Chart, lab work & pertinent test results  Airway Mallampati: II  TM Distance: >3 FB Neck ROM: full    Dental  (+) Missing   Pulmonary    Pulmonary exam normal breath sounds clear to auscultation       Cardiovascular hypertension, Normal cardiovascular exam Rhythm:regular Rate:Normal     Neuro/Psych PSYCHIATRIC DISORDERS    GI/Hepatic   Endo/Other    Renal/GU Renal disease     Musculoskeletal   Abdominal   Peds  Hematology  (+) Blood dyscrasia, , CA   Anesthesia Other Findings   Reproductive/Obstetrics                             Anesthesia Physical Anesthesia Plan  ASA: III  Anesthesia Plan: MAC   Post-op Pain Management:    Induction:   PONV Risk Score and Plan: 2 and Midazolam and Treatment may vary due to age or medical condition  Airway Management Planned:   Additional Equipment:   Intra-op Plan:   Post-operative Plan:   Informed Consent: I have reviewed the patients History and Physical, chart, labs and discussed the procedure including the risks, benefits and alternatives for the proposed anesthesia with the patient or authorized representative who has indicated his/her understanding and acceptance.     Plan Discussed with: CRNA  Anesthesia Plan Comments:         Anesthesia Quick Evaluation

## 2018-08-17 NOTE — Transfer of Care (Signed)
Immediate Anesthesia Transfer of Care Note  Patient: Sherry Oneal  Procedure(s) Performed: CATARACT EXTRACTION PHACO AND INTRAOCULAR LENS PLACEMENT ([facility])  RIGHT (Right Eye)  Patient Location: PACU  Anesthesia Type: MAC  Level of Consciousness: awake, alert  and patient cooperative  Airway and Oxygen Therapy: Patient Spontanous Breathing and Patient connected to supplemental oxygen  Post-op Assessment: Post-op Vital signs reviewed, Patient's Cardiovascular Status Stable, Respiratory Function Stable, Patent Airway and No signs of Nausea or vomiting  Post-op Vital Signs: Reviewed and stable  Complications: No apparent anesthesia complications

## 2018-08-17 NOTE — Op Note (Signed)
LOCATION:  [facility]   PREOPERATIVE DIAGNOSIS:    Nuclear sclerotic cataract right eye. H25.11   POSTOPERATIVE DIAGNOSIS:  Nuclear sclerotic cataract right eye.     PROCEDURE:  Phacoemusification with posterior chamber intraocular lens placement of the right eye   LENS:   Implant Name Type Inv. Item Serial No. Manufacturer Lot No. LRB No. Used  LENS IOL DIOP 17.5 - [id number] Intraocular Lens LENS IOL DIOP 17.5 [id number]  Right 1        ULTRASOUND TIME: 14 % of 1 minutes, 5 seconds.  CDE 9.1   SURGEON:  Wyonia Hough, MD   ANESTHESIA:  Topical with tetracaine drops and 2% Xylocaine jelly, augmented with 1% preservative-free intracameral lidocaine.    COMPLICATIONS:  None.   DESCRIPTION OF PROCEDURE:  The patient was identified in the holding room and transported to the operating room and placed in the supine position under the operating microscope.  The right eye was identified as the operative eye and it was prepped and draped in the usual sterile ophthalmic fashion.   A 1 millimeter clear-corneal paracentesis was made at the 12:00 position.  0.5 ml of preservative-free 1% lidocaine was injected into the anterior chamber. The anterior chamber was filled with Viscoat viscoelastic.  A 2.4 millimeter keratome was used to make a near-clear corneal incision at the 9:00 position.  A curvilinear capsulorrhexis was made with a cystotome and capsulorrhexis forceps.  Balanced salt solution was used to hydrodissect and hydrodelineate the nucleus.   Phacoemulsification was then used in stop and chop fashion to remove the lens nucleus and epinucleus.  The remaining cortex was then removed using the irrigation and aspiration handpiece. Provisc was then placed into the capsular bag to distend it for lens placement.  A lens was then injected into the capsular bag.  The remaining viscoelastic was aspirated.   Wounds were hydrated with balanced salt solution.  The anterior  chamber was inflated to a physiologic pressure with balanced salt solution.  No wound leaks were noted. Vigamox 0.2 ml of a 1mg  per ml solution was injected into the anterior chamber for a dose of 0.2 mg of intracameral antibiotic at the completion of the case.   Timolol and Brimonidine drops were applied to the eye.  The patient was taken to the recovery room in stable condition without complications of anesthesia or surgery.   Webb Weed 08/17/2018, 10:41 AM

## 2018-08-17 NOTE — Anesthesia Procedure Notes (Signed)
Procedure Name: MAC Date/Time: 08/17/2018 10:26 AM Performed by: Lind Guest, CRNA Pre-anesthesia Checklist: Patient identified, Emergency Drugs available, Patient being monitored, Suction available and Timeout performed Patient Re-evaluated:Patient Re-evaluated prior to induction Oxygen Delivery Method: Nasal cannula

## 2018-08-18 ENCOUNTER — Encounter: Payer: Self-pay | Admitting: Ophthalmology

## 2018-08-24 DIAGNOSIS — M5136 Other intervertebral disc degeneration, lumbar region: Secondary | ICD-10-CM | POA: Diagnosis not present

## 2018-08-24 DIAGNOSIS — G8929 Other chronic pain: Secondary | ICD-10-CM | POA: Diagnosis not present

## 2018-08-24 DIAGNOSIS — M545 Low back pain: Secondary | ICD-10-CM | POA: Diagnosis not present

## 2018-08-26 ENCOUNTER — Encounter: Payer: Self-pay | Admitting: Nurse Practitioner

## 2018-08-26 ENCOUNTER — Ambulatory Visit (INDEPENDENT_AMBULATORY_CARE_PROVIDER_SITE_OTHER): Payer: Medicare HMO | Admitting: Nurse Practitioner

## 2018-08-26 VITALS — BP 127/61 | HR 69 | Resp 16 | Ht 64.5 in | Wt 137.6 lb

## 2018-08-26 DIAGNOSIS — J029 Acute pharyngitis, unspecified: Secondary | ICD-10-CM | POA: Insufficient documentation

## 2018-08-26 MED ORDER — FIRST-DUKES MOUTHWASH MT SUSP: OROMUCOSAL | 1 refills | Status: DC

## 2018-08-26 MED ORDER — LEVOFLOXACIN 500 MG PO TABS: 500.0000 mg | ORAL_TABLET | Freq: Every day | ORAL | 0 refills | Status: DC

## 2018-08-26 NOTE — Progress Notes (Signed)
[facility] [place], [place] [address]  Internal MEDICINE  Office Visit Note  Patient Name: Sherry Oneal  [id number]  [id number]  Date of Service: 08/26/2018   Pt is here for a sick visit.  Chief Complaint  Patient presents with  . Sore Throat    been going on since wednesday, no fever or chills  . Otalgia  . Nasal Congestion     Sore Throat   This is a recurrent problem. The current episode started in the past 7 days. The problem has been gradually worsening. There has been no fever. Associated symptoms include congestion, coughing, ear pain, headaches, a hoarse voice, a plugged ear sensation and swollen glands. Pertinent negatives include no shortness of breath or vomiting. She has tried acetaminophen for the symptoms. The treatment provided no relief.        Current Medication:  Outpatient Encounter Medications as of 08/26/2018  Medication Sig  . acetaminophen (TYLENOL) 500 MG tablet Take 500 mg by mouth every 4 (four) hours.  . ALPRAZolam (XANAX) 0.25 MG tablet Take 1 tablet (0.25 mg total) by mouth at bedtime as needed.  Marland Kitchen atorvastatin (LIPITOR) 10 MG tablet Take 1 tablet (10 mg total) by mouth daily.  . Calcium Carbonate-Vitamin D 600-400 MG-UNIT tablet Take 1 tablet by mouth daily.  . Cranberry 500 MG CAPS Take 500 mg by mouth every morning.  Marland Kitchen DARATUMUMAB IV Inject into the vein every 30 (thirty) days.  Marland Kitchen dexamethasone (DECADRON) 6 MG tablet Take 12 mg by mouth once a week. Takes on Tues evenings  . diphenhydrAMINE (BENADRYL ALLERGY) 25 MG tablet Take 25 mg by mouth daily as needed.  . docusate (COLACE) 50 MG/5ML liquid Take by mouth daily as needed for mild constipation.  Marland Kitchen ELIQUIS 5 MG TABS tablet Take 1 tablet by mouth 2 (two) times daily.  . fluticasone (FLONASE) 50 MCG/ACT nasal spray Place into both nostrils daily.  Marland Kitchen lisinopril (PRINIVIL,ZESTRIL) 10 MG tablet Take 1 tablet (10 mg total) by mouth daily.  . Multiple  Vitamins-Minerals (PRESERVISION AREDS PO) Take 1 tablet by mouth every morning.  Marland Kitchen omeprazole (PRILOSEC) 20 MG capsule Take 20 mg by mouth daily.  . ondansetron (ZOFRAN-ODT) 4 MG disintegrating tablet DISSOLVE 1 TABLET IN MOUTH EVERY 8 HOURS AS NEEDED FOR NAUSEA  . Phenylephrine-DM-GG (TUSSIN CF PO) Take by mouth as needed.  . polyethylene glycol (MIRALAX / GLYCOLAX) packet Take 17 g by mouth daily as needed.  . pomalidomide (POMALYST) 4 MG capsule Take 1 capsule by mouth once daily for days 1-21. 7 days rest.  . prochlorperazine (COMPAZINE) 5 MG tablet Take 5 mg by mouth every 8 (eight) hours as needed.  . senna (SENOKOT) 8.6 MG tablet Take 1 tablet by mouth daily as needed for constipation.  . valACYclovir (VALTREX) 500 MG tablet Take 500 mg by mouth daily.  . Zoledronic Acid (ZOMETA IV) Inject into the vein every 30 (thirty) days.  Marland Kitchen albuterol (PROVENTIL HFA;VENTOLIN HFA) 108 (90 Base) MCG/ACT inhaler Inhale 2 puffs into the lungs every 6 (six) hours as needed for wheezing or shortness of breath. (Patient not taking: Reported on 08/10/2018)  . Diphenhyd-Hydrocort-Nystatin (FIRST-DUKES MOUTHWASH) SUSP swishe and swallow with 58ms every 4 to 6 hours as needed for sore throat .  . guaiFENesin-codeine 100-10 MG/5ML syrup Take 5 mLs by mouth every 6 (six) hours as needed for cough. (Patient not taking: Reported on 08/10/2018)  . ipratropium-albuterol (DUONEB) 0.5-2.5 (3) MG/3ML SOLN Take 3 mLs by nebulization  every 6 (six) hours as needed. (Patient not taking: Reported on 08/10/2018)  . levofloxacin (LEVAQUIN) 500 MG tablet Take 1 tablet (500 mg total) by mouth daily.   No facility-administered encounter medications on file as of 08/26/2018.       Medical History: Past Medical History:  Diagnosis Date  . Anemia   . Arthritis    lower back  . High blood pressure   . High cholesterol   . Multiple myeloma ([facility])   . Renal insufficiency   . Wears dentures    partial upper     Today's Vitals    08/26/18 0838  BP: 127/61  Pulse: 69  Resp: 16  SpO2: 98%  Weight: 137 lb 9.6 oz (62.4 kg)  Height: 5' 4.5" (1.638 m)   Review of Systems  Constitutional: Positive for chills and fatigue. Negative for fever.  HENT: Positive for congestion, ear pain, hoarse voice, postnasal drip, rhinorrhea and sore throat.   Eyes: Negative.   Respiratory: Positive for cough and wheezing. Negative for shortness of breath.   Cardiovascular: Negative for chest pain and palpitations.  Gastrointestinal: Positive for nausea. Negative for vomiting.  Endocrine: Negative for cold intolerance, heat intolerance, polydipsia, polyphagia and polyuria.  Musculoskeletal: Negative for arthralgias, back pain and myalgias.  Skin: Negative for rash.  Allergic/Immunologic: Positive for environmental allergies.  Neurological: Positive for headaches.  Hematological: Positive for adenopathy.  Psychiatric/Behavioral: Negative for behavioral problems. The patient is not nervous/anxious.     Physical Exam  Constitutional: She is oriented to person, place, and time. She appears well-developed and well-nourished. No distress.  HENT:  Head: Normocephalic and atraumatic.  Right Ear: Tympanic membrane normal.  Left Ear: Tympanic membrane normal.  Mouth/Throat: Mucous membranes are normal. Posterior oropharyngeal edema and posterior oropharyngeal erythema present. No oropharyngeal exudate. Tonsils are 1+ on the right. Tonsils are 1+ on the left.  Eyes: Pupils are equal, round, and reactive to light. EOM are normal.  Neck: Normal range of motion. Neck supple. No JVD present. No tracheal deviation present. No thyromegaly present.  Cardiovascular: Normal rate, regular rhythm and normal heart sounds. Exam reveals no gallop and no friction rub.  No murmur heard. Pulmonary/Chest: Effort normal. No respiratory distress. She has no wheezes. She has no rales. She exhibits no tenderness.  Abdominal: Soft. Bowel sounds are normal.   Musculoskeletal: Normal range of motion.  Lymphadenopathy:    She has no cervical adenopathy.  Neurological: She is alert and oriented to person, place, and time. No cranial nerve deficit.  Skin: Skin is warm and dry. Capillary refill takes less than 2 seconds. She is not diaphoretic.  Psychiatric: She has a normal mood and affect. Her behavior is normal. Judgment and thought content normal.  Nursing note and vitals reviewed.  Assessment/Plan: 1. Acute pharyngitis, unspecified etiology Start levaquin 500mg daily for 10 days. Rest and increase fluids. OTC tylenol to treat symptoms.  - levofloxacin (LEVAQUIN) 500 MG tablet; Take 1 tablet (500 mg total) by mouth daily.  Dispense: 10 tablet; Refill: 0  2. Sore throat Gargle with [employer]'s mouthwash every four to six hours as needed for sore throat.  - Diphenhyd-Hydrocort-Nystatin (FIRST-DUKES MOUTHWASH) SUSP; swishe and swallow with 5mls every 4 to 6 hours as needed for sore throat .  Dispense: 200 mL; Refill: 1  General Counseling: Lilah verbalizes understanding of the findings of todays visit and agrees with plan of treatment. I have discussed any further diagnostic evaluation that may be needed or ordered today. We also reviewed   her medications today. she has been encouraged to call the office with any questions or concerns that should arise related to todays visit.    Counseling:    Rest and increase fluids. Continue using OTC medication to control symptoms.   This patient was seen by Heather Boscia FNP Collaboration with Dr Fozia M Khan as a part of collaborative care agreement  Meds ordered this encounter  Medications  . levofloxacin (LEVAQUIN) 500 MG tablet    Sig: Take 1 tablet (500 mg total) by mouth daily.    Dispense:  10 tablet    Refill:  0    Order Specific Question:   Supervising Provider    Answer:   KHAN, FOZIA M [[id number]]  . Diphenhyd-Hydrocort-Nystatin (FIRST-DUKES MOUTHWASH) SUSP    Sig: swishe and swallow with 5mls  every 4 to 6 hours as needed for sore throat .    Dispense:  200 mL    Refill:  1    Order Specific Question:   Supervising Provider    Answer:   KHAN, FOZIA M [[id number]]    Time spent: 15 Minutes 

## 2018-08-31 ENCOUNTER — Ambulatory Visit (INDEPENDENT_AMBULATORY_CARE_PROVIDER_SITE_OTHER): Payer: Medicare HMO | Admitting: Adult Health

## 2018-08-31 ENCOUNTER — Encounter: Payer: Self-pay | Admitting: Adult Health

## 2018-08-31 VITALS — BP 102/60 | HR 89 | Temp 97.7°F | Resp 16 | Ht 64.5 in | Wt 135.2 lb

## 2018-08-31 DIAGNOSIS — I10 Essential (primary) hypertension: Secondary | ICD-10-CM | POA: Diagnosis not present

## 2018-08-31 DIAGNOSIS — C9000 Multiple myeloma not having achieved remission: Secondary | ICD-10-CM | POA: Diagnosis not present

## 2018-08-31 DIAGNOSIS — J029 Acute pharyngitis, unspecified: Secondary | ICD-10-CM | POA: Diagnosis not present

## 2018-08-31 DIAGNOSIS — R059 Cough, unspecified: Secondary | ICD-10-CM

## 2018-08-31 DIAGNOSIS — R05 Cough: Secondary | ICD-10-CM

## 2018-08-31 MED ORDER — PREDNISONE 10 MG PO TABS: ORAL_TABLET | ORAL | 0 refills | Status: DC

## 2018-08-31 NOTE — Progress Notes (Signed)
[facility] [place], [place] [address]  Internal MEDICINE  Office Visit Note  Patient Name: Sherry Oneal  [id number]  [id number]  Date of Service: 08/31/2018  Chief Complaint  Patient presents with  . Cough    still has cough and hoarsness   HPI Pt is here for a sick visit.  She is 5 days into a 10 day course of Levaquin.  She reports she got her weekly dose of Dexamethasone yesterday after she made this appointment and she feels much better.  She is still complaining of intermittent coughing at times but continues to feel better. She is due to start her next round of chemo in one week.  She denies fever, SOB or other never, worsening symptoms. She has been using albuterol nebulizer twice daily.    Current Medication:  Outpatient Encounter Medications as of 08/31/2018  Medication Sig  . acetaminophen (TYLENOL) 500 MG tablet Take 500 mg by mouth every 4 (four) hours.  Marland Kitchen albuterol (PROVENTIL HFA;VENTOLIN HFA) 108 (90 Base) MCG/ACT inhaler Inhale 2 puffs into the lungs every 6 (six) hours as needed for wheezing or shortness of breath.  . ALPRAZolam (XANAX) 0.25 MG tablet Take 1 tablet (0.25 mg total) by mouth at bedtime as needed.  Marland Kitchen atorvastatin (LIPITOR) 10 MG tablet Take 1 tablet (10 mg total) by mouth daily.  . Calcium Carbonate-Vitamin D 600-400 MG-UNIT tablet Take 1 tablet by mouth daily.  . Cranberry 500 MG CAPS Take 500 mg by mouth every morning.  Marland Kitchen DARATUMUMAB IV Inject into the vein every 30 (thirty) days.  Marland Kitchen dexamethasone (DECADRON) 6 MG tablet Take 12 mg by mouth once a week. Takes on Tues evenings  . Diphenhyd-Hydrocort-Nystatin (FIRST-DUKES MOUTHWASH) SUSP swishe and swallow with 39ms every 4 to 6 hours as needed for sore throat .  . diphenhydrAMINE (BENADRYL ALLERGY) 25 MG tablet Take 25 mg by mouth daily as needed.  . docusate (COLACE) 50 MG/5ML liquid Take by mouth daily as needed for mild constipation.  .Marland KitchenELIQUIS 5 MG TABS tablet Take 1 tablet  by mouth 2 (two) times daily.  . fluticasone (FLONASE) 50 MCG/ACT nasal spray Place into both nostrils daily.  .Marland KitchenguaiFENesin-codeine 100-10 MG/5ML syrup Take 5 mLs by mouth every 6 (six) hours as needed for cough.  .Marland Kitchenipratropium-albuterol (DUONEB) 0.5-2.5 (3) MG/3ML SOLN Take 3 mLs by nebulization every 6 (six) hours as needed.  .Marland Kitchenlevofloxacin (LEVAQUIN) 500 MG tablet Take 1 tablet (500 mg total) by mouth daily.  .Marland Kitchenlisinopril (PRINIVIL,ZESTRIL) 10 MG tablet Take 1 tablet (10 mg total) by mouth daily.  . Multiple Vitamins-Minerals (PRESERVISION AREDS PO) Take 1 tablet by mouth every morning.  .Marland Kitchenomeprazole (PRILOSEC) 20 MG capsule Take 20 mg by mouth daily.  . ondansetron (ZOFRAN-ODT) 4 MG disintegrating tablet DISSOLVE 1 TABLET IN MOUTH EVERY 8 HOURS AS NEEDED FOR NAUSEA  . Phenylephrine-DM-GG (TUSSIN CF PO) Take by mouth as needed.  . polyethylene glycol (MIRALAX / GLYCOLAX) packet Take 17 g by mouth daily as needed.  . pomalidomide (POMALYST) 4 MG capsule Take 1 capsule by mouth once daily for days 1-21. 7 days rest.  . prochlorperazine (COMPAZINE) 5 MG tablet Take 5 mg by mouth every 8 (eight) hours as needed.  . senna (SENOKOT) 8.6 MG tablet Take 1 tablet by mouth daily as needed for constipation.  . valACYclovir (VALTREX) 500 MG tablet Take 500 mg by mouth daily.  . Zoledronic Acid (ZOMETA IV) Inject into the vein every 30 (  thirty) days.   No facility-administered encounter medications on file as of 08/31/2018.    Medical History: Past Medical History:  Diagnosis Date  . Anemia   . Arthritis    lower back  . High blood pressure   . High cholesterol   . Multiple myeloma ([place])   . Renal insufficiency   . Wears dentures    partial upper   Vital Signs: BP 102/60   Pulse 89   Temp 97.7 F (36.5 C)   Resp 16   Ht 5' 4.5" (1.638 m)   Wt 135 lb 3.2 oz (61.3 kg)   SpO2 95%   BMI 22.85 kg/m   Review of Systems  Constitutional: Negative for chills, fatigue and unexpected weight  change.  HENT: Negative for congestion, rhinorrhea, sneezing and sore throat.   Eyes: Negative for photophobia, pain and redness.  Respiratory: Negative for cough, chest tightness and shortness of breath.   Cardiovascular: Negative for chest pain and palpitations.  Gastrointestinal: Negative for abdominal pain, constipation, diarrhea, nausea and vomiting.  Endocrine: Negative.   Genitourinary: Negative for dysuria and frequency.  Musculoskeletal: Negative for arthralgias, back pain, joint swelling and neck pain.  Skin: Negative for rash.  Allergic/Immunologic: Negative.   Neurological: Negative for tremors and numbness.  Hematological: Negative for adenopathy. Does not bruise/bleed easily.  Psychiatric/Behavioral: Negative for behavioral problems and sleep disturbance. The patient is not nervous/anxious.    Physical Exam  Constitutional: She is oriented to person, place, and time. She appears well-developed and well-nourished. No distress.  HENT:  Head: Normocephalic and atraumatic.  Mouth/Throat: Oropharynx is clear and moist. No oropharyngeal exudate.  Eyes: Pupils are equal, round, and reactive to light. EOM are normal.  Neck: Normal range of motion. Neck supple. No JVD present. No tracheal deviation present. No thyromegaly present.  Cardiovascular: Normal rate, regular rhythm and normal heart sounds. Exam reveals no gallop and no friction rub.  No murmur heard. Pulmonary/Chest: Effort normal and breath sounds normal. No respiratory distress. She has no wheezes. She has no rales. She exhibits no tenderness.  Abdominal: Soft. There is no tenderness. There is no guarding.  Musculoskeletal: Normal range of motion.  Lymphadenopathy:    She has no cervical adenopathy.  Neurological: She is alert and oriented to person, place, and time. No cranial nerve deficit.  Skin: Skin is warm and dry. She is not diaphoretic.  Psychiatric: She has a normal mood and affect. Her behavior is normal.  Judgment and thought content normal.  Nursing note and vitals reviewed.  Assessment/Plan: 1. Acute pharyngitis, unspecified etiology Pt is doing well, has 5 more days of antibiotics. Take prednisone taper as discussed.    2. Cough Use prednisone as needed. Continue using nebulizer twice daily.  - predniSONE (DELTASONE) 10 MG tablet; Use per dose pack  Dispense: 21 tablet; Refill: 0  3. Essential hypertension Stable, doing well on current medications.  Continue as directed.   4. Multiple myeloma, remission status unspecified (HCC) In remission, starts another round of chemo next week.    General Counseling: fantasha daniele understanding of the findings of todays visit and agrees with plan of treatment. I have discussed any further diagnostic evaluation that may be needed or ordered today. We also reviewed her medications today. she has been encouraged to call the office with any questions or concerns that should arise related to todays visit.   No orders of the defined types were placed in this encounter.   No orders of the defined types  were placed in this encounter.   Time spent: 25 Minutes  This patient was seen by Orson Gear AGNP-C in Collaboration with Dr Lavera Guise as a part of collaborative care agreement

## 2018-08-31 NOTE — Patient Instructions (Signed)

## 2018-09-06 DIAGNOSIS — D61810 Antineoplastic chemotherapy induced pancytopenia: Secondary | ICD-10-CM | POA: Diagnosis not present

## 2018-09-06 DIAGNOSIS — M160 Bilateral primary osteoarthritis of hip: Secondary | ICD-10-CM | POA: Diagnosis not present

## 2018-09-06 DIAGNOSIS — J069 Acute upper respiratory infection, unspecified: Secondary | ICD-10-CM | POA: Diagnosis not present

## 2018-09-06 DIAGNOSIS — T451X5A Adverse effect of antineoplastic and immunosuppressive drugs, initial encounter: Secondary | ICD-10-CM | POA: Diagnosis not present

## 2018-09-06 DIAGNOSIS — C9000 Multiple myeloma not having achieved remission: Secondary | ICD-10-CM | POA: Diagnosis not present

## 2018-09-06 DIAGNOSIS — R0609 Other forms of dyspnea: Secondary | ICD-10-CM | POA: Diagnosis not present

## 2018-09-06 DIAGNOSIS — C9001 Multiple myeloma in remission: Secondary | ICD-10-CM | POA: Diagnosis not present

## 2018-09-06 DIAGNOSIS — D801 Nonfamilial hypogammaglobulinemia: Secondary | ICD-10-CM | POA: Diagnosis not present

## 2018-09-06 DIAGNOSIS — Z5112 Encounter for antineoplastic immunotherapy: Secondary | ICD-10-CM | POA: Diagnosis not present

## 2018-09-06 DIAGNOSIS — E041 Nontoxic single thyroid nodule: Secondary | ICD-10-CM | POA: Diagnosis not present

## 2018-09-06 DIAGNOSIS — M5137 Other intervertebral disc degeneration, lumbosacral region: Secondary | ICD-10-CM | POA: Diagnosis not present

## 2018-09-14 ENCOUNTER — Encounter: Admission: RE | Payer: Self-pay | Source: Ambulatory Visit

## 2018-09-14 ENCOUNTER — Ambulatory Visit: Admission: RE | Admit: 2018-09-14 | Payer: Medicare HMO | Source: Ambulatory Visit | Admitting: Ophthalmology

## 2018-09-14 SURGERY — PHACOEMULSIFICATION, CATARACT, WITH IOL INSERTION
Anesthesia: Topical | Laterality: Left

## 2018-10-04 DIAGNOSIS — Z882 Allergy status to sulfonamides status: Secondary | ICD-10-CM | POA: Diagnosis not present

## 2018-10-04 DIAGNOSIS — D61818 Other pancytopenia: Secondary | ICD-10-CM | POA: Diagnosis not present

## 2018-10-04 DIAGNOSIS — Z9221 Personal history of antineoplastic chemotherapy: Secondary | ICD-10-CM | POA: Diagnosis not present

## 2018-10-04 DIAGNOSIS — D801 Nonfamilial hypogammaglobulinemia: Secondary | ICD-10-CM | POA: Diagnosis not present

## 2018-10-04 DIAGNOSIS — C9000 Multiple myeloma not having achieved remission: Secondary | ICD-10-CM | POA: Diagnosis not present

## 2018-10-04 DIAGNOSIS — J329 Chronic sinusitis, unspecified: Secondary | ICD-10-CM | POA: Diagnosis not present

## 2018-10-04 DIAGNOSIS — E041 Nontoxic single thyroid nodule: Secondary | ICD-10-CM | POA: Diagnosis not present

## 2018-10-04 DIAGNOSIS — M545 Low back pain: Secondary | ICD-10-CM | POA: Diagnosis not present

## 2018-10-04 DIAGNOSIS — M1612 Unilateral primary osteoarthritis, left hip: Secondary | ICD-10-CM | POA: Diagnosis not present

## 2018-10-04 DIAGNOSIS — J069 Acute upper respiratory infection, unspecified: Secondary | ICD-10-CM | POA: Diagnosis not present

## 2018-10-04 DIAGNOSIS — Z886 Allergy status to analgesic agent status: Secondary | ICD-10-CM | POA: Diagnosis not present

## 2018-10-04 DIAGNOSIS — R0609 Other forms of dyspnea: Secondary | ICD-10-CM | POA: Diagnosis not present

## 2018-10-07 DIAGNOSIS — E041 Nontoxic single thyroid nodule: Secondary | ICD-10-CM | POA: Diagnosis not present

## 2018-10-11 DIAGNOSIS — Z5112 Encounter for antineoplastic immunotherapy: Secondary | ICD-10-CM | POA: Diagnosis not present

## 2018-10-11 DIAGNOSIS — R0609 Other forms of dyspnea: Secondary | ICD-10-CM | POA: Diagnosis not present

## 2018-10-11 DIAGNOSIS — C9001 Multiple myeloma in remission: Secondary | ICD-10-CM | POA: Diagnosis not present

## 2018-10-11 DIAGNOSIS — Z79899 Other long term (current) drug therapy: Secondary | ICD-10-CM | POA: Diagnosis not present

## 2018-10-14 DIAGNOSIS — R69 Illness, unspecified: Secondary | ICD-10-CM | POA: Diagnosis not present

## 2018-10-31 DIAGNOSIS — H2512 Age-related nuclear cataract, left eye: Secondary | ICD-10-CM | POA: Diagnosis not present

## 2018-11-07 NOTE — Discharge Instructions (Signed)

## 2018-11-08 DIAGNOSIS — R0609 Other forms of dyspnea: Secondary | ICD-10-CM | POA: Diagnosis not present

## 2018-11-08 DIAGNOSIS — M160 Bilateral primary osteoarthritis of hip: Secondary | ICD-10-CM | POA: Diagnosis not present

## 2018-11-08 DIAGNOSIS — D61818 Other pancytopenia: Secondary | ICD-10-CM | POA: Diagnosis not present

## 2018-11-08 DIAGNOSIS — E041 Nontoxic single thyroid nodule: Secondary | ICD-10-CM | POA: Diagnosis not present

## 2018-11-08 DIAGNOSIS — M545 Low back pain: Secondary | ICD-10-CM | POA: Diagnosis not present

## 2018-11-08 DIAGNOSIS — Z880 Allergy status to penicillin: Secondary | ICD-10-CM | POA: Diagnosis not present

## 2018-11-08 DIAGNOSIS — Z5112 Encounter for antineoplastic immunotherapy: Secondary | ICD-10-CM | POA: Diagnosis not present

## 2018-11-08 DIAGNOSIS — C9002 Multiple myeloma in relapse: Secondary | ICD-10-CM | POA: Diagnosis not present

## 2018-11-08 DIAGNOSIS — D801 Nonfamilial hypogammaglobulinemia: Secondary | ICD-10-CM | POA: Diagnosis not present

## 2018-11-08 DIAGNOSIS — D61810 Antineoplastic chemotherapy induced pancytopenia: Secondary | ICD-10-CM | POA: Diagnosis not present

## 2018-11-08 DIAGNOSIS — Z91041 Radiographic dye allergy status: Secondary | ICD-10-CM | POA: Diagnosis not present

## 2018-11-08 DIAGNOSIS — C9000 Multiple myeloma not having achieved remission: Secondary | ICD-10-CM | POA: Diagnosis not present

## 2018-11-08 DIAGNOSIS — Z885 Allergy status to narcotic agent status: Secondary | ICD-10-CM | POA: Diagnosis not present

## 2018-11-09 ENCOUNTER — Encounter
Admission: RE | Disposition: A | Discharge status: Home / Self Care | Payer: Self-pay | Source: Ambulatory Visit | Attending: Ophthalmology

## 2018-11-09 ENCOUNTER — Ambulatory Visit: Payer: Medicare HMO | Admitting: Anesthesiology

## 2018-11-09 ENCOUNTER — Ambulatory Visit
Admission: RE | Admit: 2018-11-09 | Discharge: 2018-11-09 | Disposition: A | Discharge status: Home / Self Care | Payer: Medicare HMO | Source: Ambulatory Visit | Attending: Ophthalmology | Admitting: Ophthalmology

## 2018-11-09 DIAGNOSIS — I10 Essential (primary) hypertension: Secondary | ICD-10-CM | POA: Insufficient documentation

## 2018-11-09 DIAGNOSIS — H2512 Age-related nuclear cataract, left eye: Secondary | ICD-10-CM | POA: Insufficient documentation

## 2018-11-09 DIAGNOSIS — H25812 Combined forms of age-related cataract, left eye: Secondary | ICD-10-CM | POA: Diagnosis not present

## 2018-11-09 DIAGNOSIS — M1990 Unspecified osteoarthritis, unspecified site: Secondary | ICD-10-CM | POA: Diagnosis not present

## 2018-11-09 DIAGNOSIS — K219 Gastro-esophageal reflux disease without esophagitis: Secondary | ICD-10-CM | POA: Insufficient documentation

## 2018-11-09 HISTORY — PX: CATARACT EXTRACTION W/PHACO: SHX586

## 2018-11-09 SURGERY — PHACOEMULSIFICATION, CATARACT, WITH IOL INSERTION
Anesthesia: Monitor Anesthesia Care | Site: Eye | Laterality: Left

## 2018-11-09 MED ORDER — FENTANYL CITRATE (PF) 100 MCG/2ML IJ SOLN
INTRAMUSCULAR | Status: DC | PRN
  Administered 2018-11-09: 50 ug via INTRAVENOUS

## 2018-11-09 MED ORDER — ARMC OPHTHALMIC DILATING DROPS
1.0000 "application " | OPHTHALMIC | Status: DC | PRN
  Administered 2018-11-09 (×3): 1 via OPHTHALMIC

## 2018-11-09 MED ORDER — ACETAMINOPHEN 325 MG PO TABS: 650.0000 mg | ORAL_TABLET | Freq: Once | ORAL | Status: DC | PRN

## 2018-11-09 MED ORDER — LACTATED RINGERS IV SOLN: INTRAVENOUS | Status: DC

## 2018-11-09 MED ORDER — MOXIFLOXACIN HCL 0.5 % OP SOLN
OPHTHALMIC | Status: DC | PRN
  Administered 2018-11-09: 0.2 mL via OPHTHALMIC

## 2018-11-09 MED ORDER — BRIMONIDINE TARTRATE-TIMOLOL 0.2-0.5 % OP SOLN
OPHTHALMIC | Status: DC | PRN
  Administered 2018-11-09: 1 [drp] via OPHTHALMIC

## 2018-11-09 MED ORDER — EPINEPHRINE PF 1 MG/ML IJ SOLN
INTRAOCULAR | Status: DC | PRN
  Administered 2018-11-09: 61 mL via OPHTHALMIC

## 2018-11-09 MED ORDER — NA HYALUR & NA CHOND-NA HYALUR 0.4-0.35 ML IO KIT
PACK | INTRAOCULAR | Status: DC | PRN
  Administered 2018-11-09: 1 mL via INTRAOCULAR

## 2018-11-09 MED ORDER — TETRACAINE HCL 0.5 % OP SOLN
1.0000 [drp] | OPHTHALMIC | Status: DC | PRN
  Administered 2018-11-09 (×2): 1 [drp] via OPHTHALMIC

## 2018-11-09 MED ORDER — MIDAZOLAM HCL 2 MG/2ML IJ SOLN
INTRAMUSCULAR | Status: DC | PRN
  Administered 2018-11-09 (×2): 1 mg via INTRAVENOUS

## 2018-11-09 MED ORDER — ACETAMINOPHEN 160 MG/5ML PO SOLN: 325.0000 mg | ORAL | Status: DC | PRN

## 2018-11-09 MED ORDER — ONDANSETRON HCL 4 MG/2ML IJ SOLN: 4.0000 mg | Freq: Once | INTRAMUSCULAR | Status: DC | PRN

## 2018-11-09 MED ORDER — LACTATED RINGERS IV SOLN: 1000.0000 mL | INTRAVENOUS | Status: DC

## 2018-11-09 MED ORDER — MOXIFLOXACIN HCL 0.5 % OP SOLN
1.0000 [drp] | OPHTHALMIC | Status: DC | PRN
  Administered 2018-11-09 (×3): 1 [drp] via OPHTHALMIC

## 2018-11-09 MED ORDER — LIDOCAINE HCL (PF) 2 % IJ SOLN
INTRAOCULAR | Status: DC | PRN
  Administered 2018-11-09: 2 mL

## 2018-11-09 SURGICAL SUPPLY — 18 items
CANNULA ANT/CHMB 27GA (MISCELLANEOUS) ×2 IMPLANT
GLOVE SURG LX 7.5 STRW (GLOVE) ×1
GLOVE SURG LX STRL 7.5 STRW (GLOVE) ×1 IMPLANT
GLOVE SURG TRIUMPH 8.0 PF LTX (GLOVE) ×2 IMPLANT
GOWN STRL REUS W/ TWL LRG LVL3 (GOWN DISPOSABLE) ×2 IMPLANT
GOWN STRL REUS W/TWL LRG LVL3 (GOWN DISPOSABLE) ×2
LENS IOL TECNIS ITEC 18.5 (Intraocular Lens) ×2 IMPLANT
MARKER SKIN DUAL TIP RULER LAB (MISCELLANEOUS) ×2 IMPLANT
NEEDLE FILTER BLUNT 18X 1/2SAF (NEEDLE) ×1
NEEDLE FILTER BLUNT 18X1 1/2 (NEEDLE) ×1 IMPLANT
PACK CATARACT BRASINGTON (MISCELLANEOUS) ×2 IMPLANT
PACK EYE AFTER SURG (MISCELLANEOUS) ×2 IMPLANT
PACK OPTHALMIC (MISCELLANEOUS) ×2 IMPLANT
SYR 3ML LL SCALE MARK (SYRINGE) ×2 IMPLANT
SYR 5ML LL (SYRINGE) ×2 IMPLANT
SYR TB 1ML LUER SLIP (SYRINGE) ×2 IMPLANT
WATER STERILE IRR 500ML POUR (IV SOLUTION) ×2 IMPLANT
WIPE NON LINTING 3.25X3.25 (MISCELLANEOUS) ×2 IMPLANT

## 2018-11-09 NOTE — Anesthesia Preprocedure Evaluation (Signed)
Anesthesia Evaluation  Patient identified by MRN, date of birth, ID band Patient awake    Reviewed: Allergy & Precautions, NPO status , Patient's Chart, lab work & pertinent test results  History of Anesthesia Complications Negative for: history of anesthetic complications  Airway Mallampati: I  TM Distance: >3 FB Neck ROM: Full    Dental  (+) Partial Upper,    Pulmonary neg pulmonary ROS,    Pulmonary exam normal breath sounds clear to auscultation       Cardiovascular hypertension, Normal cardiovascular exam Rhythm:Regular Rate:Normal  On Eliquis (ppx for increased risk of coagulation on chemo)   Neuro/Psych negative neurological ROS     GI/Hepatic GERD  Medicated and Controlled,  Endo/Other    Renal/GU CRFRenal disease     Musculoskeletal  (+) Arthritis ,   Abdominal   Peds  Hematology  (+) Blood dyscrasia, anemia , Multiple myeloma on chemo   Anesthesia Other Findings   Reproductive/Obstetrics                             Anesthesia Physical Anesthesia Plan  ASA: III  Anesthesia Plan: MAC   Post-op Pain Management:    Induction: Intravenous  PONV Risk Score and Plan: 2 and TIVA and Midazolam  Airway Management Planned: Natural Airway  Additional Equipment:   Intra-op Plan:   Post-operative Plan:   Informed Consent: I have reviewed the patients History and Physical, chart, labs and discussed the procedure including the risks, benefits and alternatives for the proposed anesthesia with the patient or authorized representative who has indicated his/her understanding and acceptance.     Plan Discussed with: CRNA  Anesthesia Plan Comments:         Anesthesia Quick Evaluation

## 2018-11-09 NOTE — H&P (Signed)
The History and Physical notes are on paper, have been signed, and are to be scanned. The patient remains stable and unchanged from the H&P.   Previous H&P reviewed, patient examined, and there are no changes.  Sherry Oneal 11/09/2018 7:34 AM

## 2018-11-09 NOTE — Op Note (Signed)
OPERATIVE NOTE  MELESA LECY [id number] 11/09/2018   PREOPERATIVE DIAGNOSIS:  Nuclear sclerotic cataract left eye. H25.12   POSTOPERATIVE DIAGNOSIS:    Nuclear sclerotic cataract left eye.     PROCEDURE:  Phacoemusification with posterior chamber intraocular lens placement of the left eye   LENS:   Implant Name Type Inv. Item Serial No. Manufacturer Lot No. LRB No. Used  LENS IOL DIOP 18.5 - [id number] Intraocular Lens LENS IOL DIOP 18.5 [id number] AMO  Left 1        ULTRASOUND TIME: 18  % of 0 minutes 55 seconds, CDE 9.8  SURGEON:  Wyonia Hough, MD   ANESTHESIA:  Topical with tetracaine drops and 2% Xylocaine jelly, augmented with 1% preservative-free intracameral lidocaine.    COMPLICATIONS:  None.   DESCRIPTION OF PROCEDURE:  The patient was identified in the holding room and transported to the operating room and placed in the supine position under the operating microscope.  The left eye was identified as the operative eye and it was prepped and draped in the usual sterile ophthalmic fashion.   A 1 millimeter clear-corneal paracentesis was made at the 1:30 position.  0.5 ml of preservative-free 1% lidocaine was injected into the anterior chamber.  The anterior chamber was filled with Viscoat viscoelastic.  A 2.4 millimeter keratome was used to make a near-clear corneal incision at the 10:30 position.  .  A curvilinear capsulorrhexis was made with a cystotome and capsulorrhexis forceps.  Balanced salt solution was used to hydrodissect and hydrodelineate the nucleus.   Phacoemulsification was then used in stop and chop fashion to remove the lens nucleus and epinucleus.  The remaining cortex was then removed using the irrigation and aspiration handpiece. Provisc was then placed into the capsular bag to distend it for lens placement.  A lens was then injected into the capsular bag.  The remaining viscoelastic was aspirated.   Wounds were hydrated with balanced salt  solution.  The anterior chamber was inflated to a physiologic pressure with balanced salt solution.  No wound leaks were noted. Vigamox 0.2 ml of a 1mg  per ml solution was injected into the anterior chamber for a dose of 0.2 mg of intracameral antibiotic at the completion of the case.   Timolol and Brimonidine drops were applied to the eye.  The patient was taken to the recovery room in stable condition without complications of anesthesia or surgery.  Alija Riano 11/09/2018, 7:57 AM

## 2018-11-09 NOTE — Transfer of Care (Signed)
Immediate Anesthesia Transfer of Care Note  Patient: Sherry Oneal  Procedure(s) Performed: CATARACT EXTRACTION PHACO AND INTRAOCULAR LENS PLACEMENT (IOC) LEFT (Left Eye)  Patient Location: PACU  Anesthesia Type: MAC  Level of Consciousness: awake, alert  and patient cooperative  Airway and Oxygen Therapy: Patient Spontanous Breathing and Patient connected to supplemental oxygen  Post-op Assessment: Post-op Vital signs reviewed, Patient's Cardiovascular Status Stable, Respiratory Function Stable, Patent Airway and No signs of Nausea or vomiting  Post-op Vital Signs: Reviewed and stable  Complications: No apparent anesthesia complications

## 2018-11-09 NOTE — Anesthesia Procedure Notes (Signed)
Procedure Name: MAC Date/Time: 11/09/2018 7:37 AM Performed by: Janna Arch, CRNA Pre-anesthesia Checklist: Patient identified, Emergency Drugs available, Suction available, Timeout performed and Patient being monitored Patient Re-evaluated:Patient Re-evaluated prior to induction Oxygen Delivery Method: Nasal cannula Placement Confirmation: positive ETCO2

## 2018-11-09 NOTE — Anesthesia Postprocedure Evaluation (Signed)
Anesthesia Post Note  Patient: Sherry Oneal  Procedure(s) Performed: CATARACT EXTRACTION PHACO AND INTRAOCULAR LENS PLACEMENT (IOC) LEFT (Left Eye)  Patient location during evaluation: PACU Anesthesia Type: MAC Level of consciousness: awake and alert, oriented and patient cooperative Pain management: pain level controlled Vital Signs Assessment: post-procedure vital signs reviewed and stable Respiratory status: spontaneous breathing, nonlabored ventilation and respiratory function stable Cardiovascular status: blood pressure returned to baseline and stable Postop Assessment: adequate PO intake Anesthetic complications: no    Darrin Nipper

## 2018-11-10 ENCOUNTER — Encounter: Payer: Self-pay | Admitting: Ophthalmology

## 2018-11-11 ENCOUNTER — Encounter: Payer: Self-pay | Admitting: Nurse Practitioner

## 2018-11-17 ENCOUNTER — Encounter: Payer: Self-pay | Admitting: Nurse Practitioner

## 2018-11-17 ENCOUNTER — Ambulatory Visit (INDEPENDENT_AMBULATORY_CARE_PROVIDER_SITE_OTHER): Payer: Medicare HMO | Admitting: Nurse Practitioner

## 2018-11-17 VITALS — BP 126/64 | HR 68 | Resp 16 | Ht 64.5 in | Wt 135.0 lb

## 2018-11-17 DIAGNOSIS — C9000 Multiple myeloma not having achieved remission: Secondary | ICD-10-CM | POA: Diagnosis not present

## 2018-11-17 DIAGNOSIS — Z1239 Encounter for other screening for malignant neoplasm of breast: Secondary | ICD-10-CM | POA: Diagnosis not present

## 2018-11-17 DIAGNOSIS — F411 Generalized anxiety disorder: Secondary | ICD-10-CM | POA: Diagnosis not present

## 2018-11-17 DIAGNOSIS — R300 Dysuria: Secondary | ICD-10-CM | POA: Diagnosis not present

## 2018-11-17 DIAGNOSIS — Z0001 Encounter for general adult medical examination with abnormal findings: Secondary | ICD-10-CM

## 2018-11-17 DIAGNOSIS — R69 Illness, unspecified: Secondary | ICD-10-CM | POA: Diagnosis not present

## 2018-11-17 MED ORDER — ALPRAZOLAM 0.25 MG PO TABS: 0.2500 mg | ORAL_TABLET | Freq: Every evening | ORAL | 3 refills | Status: DC | PRN

## 2018-11-17 NOTE — Progress Notes (Signed)
[facility] [place], [place] [address]  Internal MEDICINE  Office Visit Note  Patient Name: Sherry Oneal  [id number]  [id number]  Date of Service: 11/17/2018   Pt is here for routine health maintenance examination  Chief Complaint  Patient presents with  . Annual Exam  . Hypertension  . Hyperlipidemia     The patient is here for routine health maintenance exam. She is feeling well, overall. She does have multiple myeloma. She is in remission. Does take chemotherapy once monthly. On the days she gets chemotherapy, she also takes dexamethasone. Keeps her awake at night. Will take 1 to 2 xanax tablets to help her sleep. She needs to have refills for this today.     Current Medication: Outpatient Encounter Medications as of 11/17/2018  Medication Sig  . acetaminophen (TYLENOL) 500 MG tablet Take 500 mg by mouth every 4 (four) hours.  Marland Kitchen albuterol (PROVENTIL HFA;VENTOLIN HFA) 108 (90 Base) MCG/ACT inhaler Inhale 2 puffs into the lungs every 6 (six) hours as needed for wheezing or shortness of breath.  . ALPRAZolam (XANAX) 0.25 MG tablet Take 1 tablet (0.25 mg total) by mouth at bedtime as needed.  Marland Kitchen atorvastatin (LIPITOR) 10 MG tablet Take 1 tablet (10 mg total) by mouth daily.  . Calcium Carbonate-Vitamin D 600-400 MG-UNIT tablet Take 1 tablet by mouth daily.  . Cranberry 500 MG CAPS Take 500 mg by mouth every morning.  Marland Kitchen DARATUMUMAB IV Inject into the vein every 30 (thirty) days.  Marland Kitchen dexamethasone (DECADRON) 6 MG tablet Take 12 mg by mouth once a week. Takes on Tues evenings  . Diphenhyd-Hydrocort-Nystatin (FIRST-DUKES MOUTHWASH) SUSP swishe and swallow with 37ms every 4 to 6 hours as needed for sore throat .  . diphenhydrAMINE (BENADRYL ALLERGY) 25 MG tablet Take 25 mg by mouth daily as needed.  . docusate (COLACE) 50 MG/5ML liquid Take by mouth daily as needed for mild constipation.  .Marland KitchenELIQUIS 5 MG TABS tablet Take 1 tablet by mouth 2 (two) times daily.   . fluticasone (FLONASE) 50 MCG/ACT nasal spray Place into both nostrils daily.  .Marland KitchenguaiFENesin-codeine 100-10 MG/5ML syrup Take 5 mLs by mouth every 6 (six) hours as needed for cough.  .Marland Kitchenipratropium-albuterol (DUONEB) 0.5-2.5 (3) MG/3ML SOLN Take 3 mLs by nebulization every 6 (six) hours as needed.  .Marland Kitchenlisinopril (PRINIVIL,ZESTRIL) 10 MG tablet Take 1 tablet (10 mg total) by mouth daily.  . Multiple Vitamins-Minerals (PRESERVISION AREDS PO) Take 1 tablet by mouth every morning.  .Marland Kitchenomeprazole (PRILOSEC) 20 MG capsule Take 20 mg by mouth daily.  . ondansetron (ZOFRAN-ODT) 4 MG disintegrating tablet DISSOLVE 1 TABLET IN MOUTH EVERY 8 HOURS AS NEEDED FOR NAUSEA  . Phenylephrine-DM-GG (TUSSIN CF PO) Take by mouth as needed.  . polyethylene glycol (MIRALAX / GLYCOLAX) packet Take 17 g by mouth daily as needed.  . pomalidomide (POMALYST) 4 MG capsule Take 1 capsule by mouth once daily for days 1-21. 7 days rest.  . prochlorperazine (COMPAZINE) 5 MG tablet Take 5 mg by mouth every 8 (eight) hours as needed.  . senna (SENOKOT) 8.6 MG tablet Take 1 tablet by mouth daily as needed for constipation.  . valACYclovir (VALTREX) 500 MG tablet Take 500 mg by mouth daily.  . Zoledronic Acid (ZOMETA IV) Inject into the vein every 30 (thirty) days.  . [DISCONTINUED] ALPRAZolam (XANAX) 0.25 MG tablet Take 1 tablet (0.25 mg total) by mouth at bedtime as needed.  . [DISCONTINUED] levofloxacin (LEVAQUIN) 500 MG tablet  Take 1 tablet (500 mg total) by mouth daily. (Patient not taking: Reported on 11/17/2018)  . [DISCONTINUED] predniSONE (DELTASONE) 10 MG tablet Use per dose pack (Patient not taking: Reported on 11/17/2018)   No facility-administered encounter medications on file as of 11/17/2018.     Surgical History: Past Surgical History:  Procedure Laterality Date  . CATARACT EXTRACTION W/PHACO Right 08/17/2018   Procedure: CATARACT EXTRACTION PHACO AND INTRAOCULAR LENS PLACEMENT (IOC)  RIGHT;  Surgeon: Brasington,  Chadwick, MD;  Location: [facility];  Service: Ophthalmology;  Laterality: Right;  . CATARACT EXTRACTION W/PHACO Left 11/09/2018   Procedure: CATARACT EXTRACTION PHACO AND INTRAOCULAR LENS PLACEMENT (IOC) LEFT;  Surgeon: Brasington, Chadwick, MD;  Location: [facility];  Service: Ophthalmology;  Laterality: Left;  . EXPLORATORY LAPAROTOMY      Medical History: Past Medical History:  Diagnosis Date  . Anemia   . Arthritis    lower back  . High blood pressure   . High cholesterol   . Multiple myeloma (HCC)   . Renal insufficiency   . Wears dentures    partial upper    Family History: Family History  Problem Relation Age of Onset  . Breast cancer Maternal Grandmother 70  . CAD Mother   . CVA Mother       Review of Systems  Constitutional: Negative for activity change, chills, fatigue and unexpected weight change.  HENT: Negative for congestion, postnasal drip, rhinorrhea, sneezing and sore throat.   Respiratory: Negative for cough, chest tightness, shortness of breath and wheezing.   Cardiovascular: Negative for chest pain and palpitations.  Gastrointestinal: Negative for abdominal pain, constipation, diarrhea, nausea and vomiting.  Endocrine: Negative for cold intolerance, heat intolerance, polydipsia and polyuria.  Musculoskeletal: Negative for arthralgias, back pain, joint swelling and neck pain.  Skin: Negative for rash.  Allergic/Immunologic: Negative for environmental allergies.  Neurological: Negative for dizziness, tremors, numbness and headaches.  Hematological: Negative for adenopathy. Does not bruise/bleed easily.  Psychiatric/Behavioral: Positive for sleep disturbance. Negative for behavioral problems (Depression), dysphoric mood and suicidal ideas.   Today's Vitals   11/17/18 0832  BP: 126/64  Pulse: 68  Resp: 16  SpO2: 98%  Weight: 135 lb (61.2 kg)  Height: 5' 4.5" (1.638 m)    Physical Exam  Constitutional: She is oriented to person,  place, and time. She appears well-developed and well-nourished. No distress.  HENT:  Head: Normocephalic and atraumatic.  Nose: Nose normal.  Mouth/Throat: No oropharyngeal exudate.  Eyes: Pupils are equal, round, and reactive to light. Conjunctivae and EOM are normal.  Neck: Normal range of motion. Neck supple. No JVD present. Carotid bruit is not present. No tracheal deviation present. No thyromegaly present.  Cardiovascular: Normal rate, regular rhythm, normal heart sounds and intact distal pulses. Exam reveals no gallop and no friction rub.  No murmur heard. Pulmonary/Chest: Effort normal and breath sounds normal. No respiratory distress. She has no wheezes. She has no rales. She exhibits no tenderness. Right breast exhibits no inverted nipple, no mass, no nipple discharge, no skin change and no tenderness. Left breast exhibits no inverted nipple, no mass, no nipple discharge, no skin change and no tenderness.  Abdominal: Soft. Bowel sounds are normal. There is no tenderness.  Musculoskeletal: Normal range of motion.  Lymphadenopathy:    She has no cervical adenopathy.  Neurological: She is alert and oriented to person, place, and time. No cranial nerve deficit.  Skin: Skin is warm and dry. Capillary refill takes less than 2 seconds. She   is not diaphoretic.  Psychiatric: She has a normal mood and affect. Her behavior is normal. Judgment and thought content normal.  Nursing note and vitals reviewed.  Depression screen PHQ 2/9 11/17/2018 08/26/2018 05/27/2018 04/11/2018  Decreased Interest 0 0 0 0  Down, Depressed, Hopeless 0 0 0 0  PHQ - 2 Score 0 0 0 0    Functional Status Survey: Is the patient deaf or have difficulty hearing?: Yes Does the patient have difficulty seeing, even when wearing glasses/contacts?: No Does the patient have difficulty concentrating, remembering, or making decisions?: No Does the patient have difficulty walking or climbing stairs?: No Does the patient have  difficulty dressing or bathing?: No Does the patient have difficulty doing errands alone such as visiting a doctor's office or shopping?: No  MMSE - Mini Mental State Exam 11/17/2018  Orientation to time 5  Orientation to Place 5  Registration 3  Attention/ Calculation 5  Recall 3  Language- name 2 objects 2  Language- repeat 1  Language- follow 3 step command 3  Language- read & follow direction 1  Write a sentence 1  Copy design 1  Total score 30    Fall Risk  11/17/2018 11/17/2018 08/26/2018 05/27/2018 04/11/2018  Falls in the past year? 0 0 No No No  Number falls in past yr: 0 0 - - -  Injury with Fall? 0 0 - - -   Assessment/Plan: 1. Encounter for general adult medical examination with abnormal findings Annual health maintenance exam today  2. Multiple myeloma, remission status unspecified (HCC) Currently in remission. Having monthly chemotherapy treatments to prevent recurrence.   3. Generalized anxiety disorder May take alprazolam 0.25mg at bedtime as needed for insomnia/anxiety - ALPRAZolam (XANAX) 0.25 MG tablet; Take 1 tablet (0.25 mg total) by mouth at bedtime as needed.  Dispense: 30 tablet; Refill: 3  4. Screening for breast cancer - MM DIGITAL SCREENING BILATERAL; Future  5. Dysuria - UA/M w/rflx Culture, Routine  General Counseling: Kynsleigh verbalizes understanding of the findings of todays visit and agrees with plan of treatment. I have discussed any further diagnostic evaluation that may be needed or ordered today. We also reviewed her medications today. she has been encouraged to call the office with any questions or concerns that should arise related to todays visit.    Counseling:  This patient was seen by Heather Boscia FNP Collaboration with Dr Fozia M Khan as a part of collaborative care agreement  Orders Placed This Encounter  Procedures  . MM DIGITAL SCREENING BILATERAL  . UA/M w/rflx Culture, Routine    Meds ordered this encounter   Medications  . ALPRAZolam (XANAX) 0.25 MG tablet    Sig: Take 1 tablet (0.25 mg total) by mouth at bedtime as needed.    Dispense:  30 tablet    Refill:  3    Order Specific Question:   Supervising Provider    Answer:   KHAN, FOZIA M [[id number]]    Time spent: 30 Minutes      Fozia M Khan, MD  Internal Medicine  

## 2018-11-18 LAB — UA/M W/RFLX CULTURE, ROUTINE
Bilirubin, UA: NEGATIVE
Glucose, UA: NEGATIVE
Ketones, UA: NEGATIVE
Leukocytes, UA: NEGATIVE
NITRITE UA: NEGATIVE
PH UA: 5.5 (ref 5.0–7.5)
Protein, UA: NEGATIVE
Specific Gravity, UA: 1.005 (ref 1.005–1.030)
UUROB: 0.2 mg/dL (ref 0.2–1.0)

## 2018-11-18 LAB — MICROSCOPIC EXAMINATION: Casts: NONE SEEN /lpf

## 2018-11-22 DIAGNOSIS — R69 Illness, unspecified: Secondary | ICD-10-CM | POA: Diagnosis not present

## 2018-12-02 DIAGNOSIS — E041 Nontoxic single thyroid nodule: Secondary | ICD-10-CM | POA: Diagnosis not present

## 2018-12-06 DIAGNOSIS — R0609 Other forms of dyspnea: Secondary | ICD-10-CM | POA: Diagnosis not present

## 2018-12-06 DIAGNOSIS — D61810 Antineoplastic chemotherapy induced pancytopenia: Secondary | ICD-10-CM | POA: Diagnosis not present

## 2018-12-06 DIAGNOSIS — Z5112 Encounter for antineoplastic immunotherapy: Secondary | ICD-10-CM | POA: Diagnosis not present

## 2018-12-06 DIAGNOSIS — C9001 Multiple myeloma in remission: Secondary | ICD-10-CM | POA: Diagnosis not present

## 2018-12-06 DIAGNOSIS — C9000 Multiple myeloma not having achieved remission: Secondary | ICD-10-CM | POA: Diagnosis not present

## 2018-12-06 DIAGNOSIS — C9002 Multiple myeloma in relapse: Secondary | ICD-10-CM | POA: Diagnosis not present

## 2018-12-06 DIAGNOSIS — Z7983 Long term (current) use of bisphosphonates: Secondary | ICD-10-CM | POA: Diagnosis not present

## 2018-12-06 DIAGNOSIS — D801 Nonfamilial hypogammaglobulinemia: Secondary | ICD-10-CM | POA: Diagnosis not present

## 2018-12-07 ENCOUNTER — Other Ambulatory Visit: Payer: Self-pay

## 2018-12-07 MED ORDER — LISINOPRIL 10 MG PO TABS: 10.0000 mg | ORAL_TABLET | Freq: Every day | ORAL | 1 refills | Status: DC

## 2018-12-16 DIAGNOSIS — H353132 Nonexudative age-related macular degeneration, bilateral, intermediate dry stage: Secondary | ICD-10-CM | POA: Diagnosis not present

## 2019-01-02 ENCOUNTER — Other Ambulatory Visit: Payer: Self-pay | Admitting: Internal Medicine

## 2019-01-02 DIAGNOSIS — Z1231 Encounter for screening mammogram for malignant neoplasm of breast: Secondary | ICD-10-CM

## 2019-01-03 DIAGNOSIS — D801 Nonfamilial hypogammaglobulinemia: Secondary | ICD-10-CM | POA: Diagnosis not present

## 2019-01-03 DIAGNOSIS — E041 Nontoxic single thyroid nodule: Secondary | ICD-10-CM | POA: Diagnosis not present

## 2019-01-03 DIAGNOSIS — Z5112 Encounter for antineoplastic immunotherapy: Secondary | ICD-10-CM | POA: Diagnosis not present

## 2019-01-03 DIAGNOSIS — Z79899 Other long term (current) drug therapy: Secondary | ICD-10-CM | POA: Diagnosis not present

## 2019-01-03 DIAGNOSIS — C9000 Multiple myeloma not having achieved remission: Secondary | ICD-10-CM | POA: Diagnosis not present

## 2019-01-03 DIAGNOSIS — D61810 Antineoplastic chemotherapy induced pancytopenia: Secondary | ICD-10-CM | POA: Diagnosis not present

## 2019-01-03 DIAGNOSIS — Z888 Allergy status to other drugs, medicaments and biological substances status: Secondary | ICD-10-CM | POA: Diagnosis not present

## 2019-01-03 DIAGNOSIS — R110 Nausea: Secondary | ICD-10-CM | POA: Diagnosis not present

## 2019-01-03 DIAGNOSIS — D61818 Other pancytopenia: Secondary | ICD-10-CM | POA: Diagnosis not present

## 2019-01-03 DIAGNOSIS — R0609 Other forms of dyspnea: Secondary | ICD-10-CM | POA: Diagnosis not present

## 2019-01-03 DIAGNOSIS — M545 Low back pain: Secondary | ICD-10-CM | POA: Diagnosis not present

## 2019-01-03 DIAGNOSIS — C9002 Multiple myeloma in relapse: Secondary | ICD-10-CM | POA: Diagnosis not present

## 2019-01-03 DIAGNOSIS — M1612 Unilateral primary osteoarthritis, left hip: Secondary | ICD-10-CM | POA: Diagnosis not present

## 2019-01-31 DIAGNOSIS — E041 Nontoxic single thyroid nodule: Secondary | ICD-10-CM | POA: Diagnosis not present

## 2019-01-31 DIAGNOSIS — Z91041 Radiographic dye allergy status: Secondary | ICD-10-CM | POA: Diagnosis not present

## 2019-01-31 DIAGNOSIS — D61818 Other pancytopenia: Secondary | ICD-10-CM | POA: Diagnosis not present

## 2019-01-31 DIAGNOSIS — D61810 Antineoplastic chemotherapy induced pancytopenia: Secondary | ICD-10-CM | POA: Diagnosis not present

## 2019-01-31 DIAGNOSIS — Z5112 Encounter for antineoplastic immunotherapy: Secondary | ICD-10-CM | POA: Diagnosis not present

## 2019-01-31 DIAGNOSIS — Z885 Allergy status to narcotic agent status: Secondary | ICD-10-CM | POA: Diagnosis not present

## 2019-01-31 DIAGNOSIS — R5383 Other fatigue: Secondary | ICD-10-CM | POA: Diagnosis not present

## 2019-01-31 DIAGNOSIS — Z880 Allergy status to penicillin: Secondary | ICD-10-CM | POA: Diagnosis not present

## 2019-01-31 DIAGNOSIS — Z886 Allergy status to analgesic agent status: Secondary | ICD-10-CM | POA: Diagnosis not present

## 2019-01-31 DIAGNOSIS — C9002 Multiple myeloma in relapse: Secondary | ICD-10-CM | POA: Diagnosis not present

## 2019-01-31 DIAGNOSIS — R0609 Other forms of dyspnea: Secondary | ICD-10-CM | POA: Diagnosis not present

## 2019-01-31 DIAGNOSIS — C9000 Multiple myeloma not having achieved remission: Secondary | ICD-10-CM | POA: Diagnosis not present

## 2019-01-31 DIAGNOSIS — D801 Nonfamilial hypogammaglobulinemia: Secondary | ICD-10-CM | POA: Diagnosis not present

## 2019-02-15 ENCOUNTER — Ambulatory Visit
Admission: RE | Admit: 2019-02-15 | Discharge: 2019-02-15 | Disposition: A | Discharge status: Home / Self Care | Payer: Medicare HMO | Source: Ambulatory Visit | Attending: Internal Medicine | Admitting: Internal Medicine

## 2019-02-15 DIAGNOSIS — Z1231 Encounter for screening mammogram for malignant neoplasm of breast: Secondary | ICD-10-CM

## 2019-02-28 DIAGNOSIS — R5383 Other fatigue: Secondary | ICD-10-CM | POA: Diagnosis not present

## 2019-02-28 DIAGNOSIS — D61818 Other pancytopenia: Secondary | ICD-10-CM | POA: Diagnosis not present

## 2019-02-28 DIAGNOSIS — C9000 Multiple myeloma not having achieved remission: Secondary | ICD-10-CM | POA: Diagnosis not present

## 2019-02-28 DIAGNOSIS — M545 Low back pain: Secondary | ICD-10-CM | POA: Diagnosis not present

## 2019-02-28 DIAGNOSIS — M25559 Pain in unspecified hip: Secondary | ICD-10-CM | POA: Diagnosis not present

## 2019-02-28 DIAGNOSIS — D61810 Antineoplastic chemotherapy induced pancytopenia: Secondary | ICD-10-CM | POA: Diagnosis not present

## 2019-02-28 DIAGNOSIS — R0609 Other forms of dyspnea: Secondary | ICD-10-CM | POA: Diagnosis not present

## 2019-02-28 DIAGNOSIS — C9001 Multiple myeloma in remission: Secondary | ICD-10-CM | POA: Diagnosis not present

## 2019-02-28 DIAGNOSIS — R69 Illness, unspecified: Secondary | ICD-10-CM | POA: Diagnosis not present

## 2019-02-28 DIAGNOSIS — E041 Nontoxic single thyroid nodule: Secondary | ICD-10-CM | POA: Diagnosis not present

## 2019-02-28 DIAGNOSIS — D801 Nonfamilial hypogammaglobulinemia: Secondary | ICD-10-CM | POA: Diagnosis not present

## 2019-02-28 DIAGNOSIS — Z5112 Encounter for antineoplastic immunotherapy: Secondary | ICD-10-CM | POA: Diagnosis not present

## 2019-03-14 ENCOUNTER — Encounter: Payer: Self-pay | Admitting: Adult Health

## 2019-03-14 ENCOUNTER — Other Ambulatory Visit: Payer: Self-pay

## 2019-03-14 ENCOUNTER — Ambulatory Visit: Payer: Medicare HMO | Admitting: Adult Health

## 2019-03-14 ENCOUNTER — Ambulatory Visit (INDEPENDENT_AMBULATORY_CARE_PROVIDER_SITE_OTHER): Payer: Medicare HMO | Admitting: Adult Health

## 2019-03-14 VITALS — BP 114/68 | HR 78 | Temp 97.7°F | Resp 18 | Ht 64.5 in | Wt 137.0 lb

## 2019-03-14 DIAGNOSIS — J0110 Acute frontal sinusitis, unspecified: Secondary | ICD-10-CM

## 2019-03-14 DIAGNOSIS — R05 Cough: Secondary | ICD-10-CM | POA: Diagnosis not present

## 2019-03-14 DIAGNOSIS — R69 Illness, unspecified: Secondary | ICD-10-CM | POA: Diagnosis not present

## 2019-03-14 DIAGNOSIS — H60392 Other infective otitis externa, left ear: Secondary | ICD-10-CM

## 2019-03-14 DIAGNOSIS — R059 Cough, unspecified: Secondary | ICD-10-CM

## 2019-03-14 MED ORDER — PREDNISONE 10 MG PO TABS: ORAL_TABLET | ORAL | 0 refills | Status: DC

## 2019-03-14 MED ORDER — IPRATROPIUM-ALBUTEROL 0.5-2.5 (3) MG/3ML IN SOLN: 3.0000 mL | Freq: Four times a day (QID) | RESPIRATORY_TRACT | 3 refills | Status: DC | PRN

## 2019-03-14 MED ORDER — CIPROFLOXACIN-DEXAMETHASONE 0.3-0.1 % OT SUSP: 4.0000 [drp] | Freq: Two times a day (BID) | OTIC | 0 refills | Status: DC

## 2019-03-14 MED ORDER — LEVOFLOXACIN 500 MG PO TABS: 500.0000 mg | ORAL_TABLET | Freq: Every day | ORAL | 0 refills | Status: DC

## 2019-03-14 MED ORDER — DEXTROMETHORPHAN-GUAIFENESIN 10-100 MG/5ML PO LIQD: 10.0000 mL | ORAL | 1 refills | Status: DC | PRN

## 2019-03-14 NOTE — Progress Notes (Signed)
[facility] [place], [place] [address]  Internal MEDICINE  Office Visit Note  Patient Name: Sherry Oneal  [id number]  [id number]  Date of Service: 03/14/2019  Chief Complaint  Patient presents with  . Cough  . Sore Throat  . Fever     HPI Pt is here for a sick visit. Pt reports 3 days of sore throat, fever, cough and headache. She is currently being treated for cancer with chemotherapy. She is also reporting ear fullness and pain that is radiating down her neck on the left side.    Current Medication:  Outpatient Encounter Medications as of 03/14/2019  Medication Sig  . acetaminophen (TYLENOL) 500 MG tablet Take 500 mg by mouth every 4 (four) hours.  Marland Kitchen albuterol (PROVENTIL HFA;VENTOLIN HFA) 108 (90 Base) MCG/ACT inhaler Inhale 2 puffs into the lungs every 6 (six) hours as needed for wheezing or shortness of breath.  . ALPRAZolam (XANAX) 0.25 MG tablet Take 1 tablet (0.25 mg total) by mouth at bedtime as needed.  Marland Kitchen atorvastatin (LIPITOR) 10 MG tablet Take 1 tablet (10 mg total) by mouth daily.  . Calcium Carbonate-Vitamin D 600-400 MG-UNIT tablet Take 1 tablet by mouth daily.  . Cranberry 500 MG CAPS Take 500 mg by mouth every morning.  Marland Kitchen DARATUMUMAB IV Inject into the vein every 30 (thirty) days.  Marland Kitchen dexamethasone (DECADRON) 6 MG tablet Take 12 mg by mouth once a week. Takes on Tues evenings  . dextromethorphan-guaiFENesin (TUSSIN DM) 10-100 MG/5ML liquid Take 10 mLs by mouth every 4 (four) hours as needed for cough.  . diphenhydrAMINE (BENADRYL ALLERGY) 25 MG tablet Take 25 mg by mouth daily as needed.  . docusate (COLACE) 50 MG/5ML liquid Take by mouth daily as needed for mild constipation.  Marland Kitchen ELIQUIS 5 MG TABS tablet Take 1 tablet by mouth 2 (two) times daily.  . fluticasone (FLONASE) 50 MCG/ACT nasal spray Place into both nostrils daily.  Marland Kitchen guaiFENesin-codeine (VIRTUSSIN A/C) 100-10 MG/5ML syrup Take 5 mLs by mouth every 6 (six) hours as needed for  cough.  Marland Kitchen guaiFENesin-codeine 100-10 MG/5ML syrup Take 5 mLs by mouth every 6 (six) hours as needed for cough.  Marland Kitchen ipratropium-albuterol (DUONEB) 0.5-2.5 (3) MG/3ML SOLN Take 3 mLs by nebulization every 6 (six) hours as needed.  Marland Kitchen lisinopril (PRINIVIL,ZESTRIL) 10 MG tablet Take 1 tablet (10 mg total) by mouth daily.  . Multiple Vitamins-Minerals (PRESERVISION AREDS PO) Take 1 tablet by mouth every morning.  Marland Kitchen omeprazole (PRILOSEC) 20 MG capsule Take 20 mg by mouth daily.  . ondansetron (ZOFRAN-ODT) 4 MG disintegrating tablet DISSOLVE 1 TABLET IN MOUTH EVERY 8 HOURS AS NEEDED FOR NAUSEA  . Phenylephrine-DM-GG (TUSSIN CF PO) Take by mouth as needed.  . polyethylene glycol (MIRALAX / GLYCOLAX) packet Take 17 g by mouth daily as needed.  . pomalidomide (POMALYST) 4 MG capsule Take 1 capsule by mouth once daily for days 1-21. 7 days rest.  . prochlorperazine (COMPAZINE) 5 MG tablet Take 5 mg by mouth every 8 (eight) hours as needed.  . senna (SENOKOT) 8.6 MG tablet Take 1 tablet by mouth daily as needed for constipation.  . valACYclovir (VALTREX) 500 MG tablet Take 500 mg by mouth daily.  . Zoledronic Acid (ZOMETA IV) Inject into the vein every 30 (thirty) days.  . [DISCONTINUED] dextromethorphan-guaiFENesin (TUSSIN DM) 10-100 MG/5ML liquid Take 10 mLs by mouth every 4 (four) hours as needed for cough.  . [DISCONTINUED] ipratropium-albuterol (DUONEB) 0.5-2.5 (3) MG/3ML SOLN Take 3 mLs  by nebulization every 6 (six) hours as needed.  . ciprofloxacin-dexamethasone (CIPRODEX) OTIC suspension Place 4 drops into the left ear 2 (two) times daily.  Marland Kitchen levofloxacin (LEVAQUIN) 500 MG tablet Take 1 tablet (500 mg total) by mouth daily.  . predniSONE (DELTASONE) 10 MG tablet Use per dose pack  . [DISCONTINUED] Diphenhyd-Hydrocort-Nystatin (FIRST-DUKES MOUTHWASH) SUSP swishe and swallow with 33ms every 4 to 6 hours as needed for sore throat . (Patient not taking: Reported on 03/14/2019)   No facility-administered  encounter medications on file as of 03/14/2019.       Medical History: Past Medical History:  Diagnosis Date  . Anemia   . Arthritis    lower back  . High blood pressure   . High cholesterol   . Multiple myeloma ([place]   . Renal insufficiency   . Wears dentures    partial upper     Vital Signs: BP 114/68   Pulse 78   Temp 97.7 F (36.5 C) (Oral)   Resp 18   Ht 5' 4.5" (1.638 m)   Wt 137 lb (62.1 kg)   SpO2 98%   BMI 23.15 kg/m    Review of Systems  Constitutional: Negative for chills, fatigue and unexpected weight change.  HENT: Positive for facial swelling, postnasal drip, sinus pressure and sinus pain. Negative for congestion, rhinorrhea, sneezing and sore throat.   Eyes: Negative for photophobia, pain and redness.  Respiratory: Positive for cough. Negative for chest tightness and shortness of breath.   Cardiovascular: Negative for chest pain and palpitations.  Gastrointestinal: Negative for abdominal pain, constipation, diarrhea, nausea and vomiting.  Endocrine: Negative.   Genitourinary: Negative for dysuria and frequency.  Musculoskeletal: Negative for arthralgias, back pain, joint swelling and neck pain.  Skin: Negative for rash.  Allergic/Immunologic: Negative.   Neurological: Negative for tremors and numbness.  Hematological: Negative for adenopathy. Does not bruise/bleed easily.  Psychiatric/Behavioral: Negative for behavioral problems and sleep disturbance. The patient is not nervous/anxious.     Physical Exam Vitals signs and nursing note reviewed.  Constitutional:      General: She is not in acute distress.    Appearance: She is well-developed. She is not diaphoretic.  HENT:     Head: Normocephalic and atraumatic.     Right Ear: Tympanic membrane and ear canal normal.     Left Ear: Swelling and tenderness present. Tympanic membrane is erythematous.     Mouth/Throat:     Pharynx: No oropharyngeal exudate.  Eyes:     Pupils: Pupils are equal,  round, and reactive to light.  Neck:     Musculoskeletal: Normal range of motion and neck supple.     Thyroid: No thyromegaly.     Vascular: No JVD.     Trachea: No tracheal deviation.  Cardiovascular:     Rate and Rhythm: Normal rate and regular rhythm.     Heart sounds: Normal heart sounds. No murmur. No friction rub. No gallop.   Pulmonary:     Effort: Pulmonary effort is normal. No respiratory distress.     Breath sounds: Normal breath sounds. No wheezing or rales.  Chest:     Chest wall: No tenderness.  Abdominal:     Palpations: Abdomen is soft.     Tenderness: There is no abdominal tenderness. There is no guarding.  Musculoskeletal: Normal range of motion.  Lymphadenopathy:     Cervical: No cervical adenopathy.  Skin:    General: Skin is warm and dry.  Neurological:  Mental Status: She is alert and oriented to person, place, and time.     Cranial Nerves: No cranial nerve deficit.  Psychiatric:        Behavior: Behavior normal.        Thought Content: Thought content normal.        Judgment: Judgment normal.    Assessment/Plan: 1. Acute non-recurrent frontal sinusitis Pt prescribed a course of prednisone and Levaquin due to multiple antibiotic allergies.   - predniSONE (DELTASONE) 10 MG tablet; Use per dose pack  Dispense: 21 tablet; Refill: 0 - levofloxacin (LEVAQUIN) 500 MG tablet; Take 1 tablet (500 mg total) by mouth daily.  Dispense: 10 tablet; Refill: 0  2. Other infective acute otitis externa of left ear Use ciprodex suspension in affected ear twice daily until better.  RTC if new or worsening symptoms develop.    - ciprofloxacin-dexamethasone (CIPRODEX) OTIC suspension; Place 4 drops into the left ear 2 (two) times daily.  Dispense: 7.5 mL; Refill: 0  3. Cough Use Duonebs as directed, and Tussin syrup as discussed for cough.    - ipratropium-albuterol (DUONEB) 0.5-2.5 (3) MG/3ML SOLN; Take 3 mLs by nebulization every 6 (six) hours as needed.  Dispense: 360  mL; Refill: 3 - dextromethorphan-guaiFENesin (TUSSIN DM) 10-100 MG/5ML liquid; Take 10 mLs by mouth every 4 (four) hours as needed for cough.  Dispense: 355 mL; Refill: 1  General Counseling: Voncile verbalizes understanding of the findings of todays visit and agrees with plan of treatment. I have discussed any further diagnostic evaluation that may be needed or ordered today. We also reviewed her medications today. she has been encouraged to call the office with any questions or concerns that should arise related to todays visit.   No orders of the defined types were placed in this encounter.   Meds ordered this encounter  Medications  . ipratropium-albuterol (DUONEB) 0.5-2.5 (3) MG/3ML SOLN    Sig: Take 3 mLs by nebulization every 6 (six) hours as needed.    Dispense:  360 mL    Refill:  3  . predniSONE (DELTASONE) 10 MG tablet    Sig: Use per dose pack    Dispense:  21 tablet    Refill:  0  . dextromethorphan-guaiFENesin (TUSSIN DM) 10-100 MG/5ML liquid    Sig: Take 10 mLs by mouth every 4 (four) hours as needed for cough.    Dispense:  355 mL    Refill:  1  . levofloxacin (LEVAQUIN) 500 MG tablet    Sig: Take 1 tablet (500 mg total) by mouth daily.    Dispense:  10 tablet    Refill:  0  . ciprofloxacin-dexamethasone (CIPRODEX) OTIC suspension    Sig: Place 4 drops into the left ear 2 (two) times daily.    Dispense:  7.5 mL    Refill:  0    Time spent: 25 Minutes  This patient was seen by Orson Gear AGNP-C in Collaboration with Dr Lavera Guise as a part of collaborative care agreement.  Kendell Bane AGNP-C Internal Medicine

## 2019-03-14 NOTE — Patient Instructions (Signed)

## 2019-03-28 DIAGNOSIS — C9000 Multiple myeloma not having achieved remission: Secondary | ICD-10-CM | POA: Diagnosis not present

## 2019-03-28 DIAGNOSIS — R0609 Other forms of dyspnea: Secondary | ICD-10-CM | POA: Diagnosis not present

## 2019-03-28 DIAGNOSIS — C9002 Multiple myeloma in relapse: Secondary | ICD-10-CM | POA: Diagnosis not present

## 2019-03-28 DIAGNOSIS — Z5112 Encounter for antineoplastic immunotherapy: Secondary | ICD-10-CM | POA: Diagnosis not present

## 2019-04-19 ENCOUNTER — Other Ambulatory Visit: Payer: Self-pay

## 2019-04-19 MED ORDER — ATORVASTATIN CALCIUM 10 MG PO TABS: 10.0000 mg | ORAL_TABLET | Freq: Every day | ORAL | 0 refills | Status: DC

## 2019-04-24 DIAGNOSIS — C9001 Multiple myeloma in remission: Secondary | ICD-10-CM | POA: Diagnosis not present

## 2019-04-25 DIAGNOSIS — C9000 Multiple myeloma not having achieved remission: Secondary | ICD-10-CM | POA: Diagnosis not present

## 2019-04-25 DIAGNOSIS — R0609 Other forms of dyspnea: Secondary | ICD-10-CM | POA: Diagnosis not present

## 2019-04-25 DIAGNOSIS — Z5112 Encounter for antineoplastic immunotherapy: Secondary | ICD-10-CM | POA: Diagnosis not present

## 2019-05-10 ENCOUNTER — Other Ambulatory Visit: Payer: Self-pay

## 2019-05-10 MED ORDER — LISINOPRIL 10 MG PO TABS: 10.0000 mg | ORAL_TABLET | Freq: Every day | ORAL | 1 refills | Status: DC

## 2019-05-18 ENCOUNTER — Ambulatory Visit: Payer: Self-pay | Admitting: Nurse Practitioner

## 2019-05-23 DIAGNOSIS — Z5112 Encounter for antineoplastic immunotherapy: Secondary | ICD-10-CM | POA: Diagnosis not present

## 2019-05-23 DIAGNOSIS — C9000 Multiple myeloma not having achieved remission: Secondary | ICD-10-CM | POA: Diagnosis not present

## 2019-05-23 DIAGNOSIS — C9001 Multiple myeloma in remission: Secondary | ICD-10-CM | POA: Diagnosis not present

## 2019-05-23 DIAGNOSIS — R0609 Other forms of dyspnea: Secondary | ICD-10-CM | POA: Diagnosis not present

## 2019-05-31 DIAGNOSIS — H6982 Other specified disorders of Eustachian tube, left ear: Secondary | ICD-10-CM | POA: Diagnosis not present

## 2019-05-31 DIAGNOSIS — H902 Conductive hearing loss, unspecified: Secondary | ICD-10-CM | POA: Diagnosis not present

## 2019-05-31 DIAGNOSIS — H6502 Acute serous otitis media, left ear: Secondary | ICD-10-CM | POA: Diagnosis not present

## 2019-06-20 DIAGNOSIS — C9001 Multiple myeloma in remission: Secondary | ICD-10-CM | POA: Diagnosis not present

## 2019-06-20 DIAGNOSIS — M25559 Pain in unspecified hip: Secondary | ICD-10-CM | POA: Diagnosis not present

## 2019-06-20 DIAGNOSIS — C9000 Multiple myeloma not having achieved remission: Secondary | ICD-10-CM | POA: Diagnosis not present

## 2019-06-20 DIAGNOSIS — C9002 Multiple myeloma in relapse: Secondary | ICD-10-CM | POA: Diagnosis not present

## 2019-06-20 DIAGNOSIS — Z5112 Encounter for antineoplastic immunotherapy: Secondary | ICD-10-CM | POA: Diagnosis not present

## 2019-06-20 DIAGNOSIS — E041 Nontoxic single thyroid nodule: Secondary | ICD-10-CM | POA: Diagnosis not present

## 2019-06-20 DIAGNOSIS — Z882 Allergy status to sulfonamides status: Secondary | ICD-10-CM | POA: Diagnosis not present

## 2019-06-20 DIAGNOSIS — Z880 Allergy status to penicillin: Secondary | ICD-10-CM | POA: Diagnosis not present

## 2019-06-20 DIAGNOSIS — M545 Low back pain: Secondary | ICD-10-CM | POA: Diagnosis not present

## 2019-06-20 DIAGNOSIS — R69 Illness, unspecified: Secondary | ICD-10-CM | POA: Diagnosis not present

## 2019-06-20 DIAGNOSIS — R0609 Other forms of dyspnea: Secondary | ICD-10-CM | POA: Diagnosis not present

## 2019-06-20 DIAGNOSIS — Z886 Allergy status to analgesic agent status: Secondary | ICD-10-CM | POA: Diagnosis not present

## 2019-07-04 ENCOUNTER — Other Ambulatory Visit: Payer: Self-pay

## 2019-07-04 MED ORDER — ATORVASTATIN CALCIUM 10 MG PO TABS: 10.0000 mg | ORAL_TABLET | Freq: Every day | ORAL | 0 refills | Status: DC

## 2019-07-13 ENCOUNTER — Ambulatory Visit: Payer: Self-pay | Admitting: Nurse Practitioner

## 2019-07-18 DIAGNOSIS — R0609 Other forms of dyspnea: Secondary | ICD-10-CM | POA: Diagnosis not present

## 2019-07-18 DIAGNOSIS — Z79899 Other long term (current) drug therapy: Secondary | ICD-10-CM | POA: Diagnosis not present

## 2019-07-18 DIAGNOSIS — C9001 Multiple myeloma in remission: Secondary | ICD-10-CM | POA: Diagnosis not present

## 2019-07-18 DIAGNOSIS — C9000 Multiple myeloma not having achieved remission: Secondary | ICD-10-CM | POA: Diagnosis not present

## 2019-08-03 ENCOUNTER — Observation Stay
Admission: EM | Admit: 2019-08-03 | Discharge: 2019-08-04 | Disposition: A | Discharge status: Home / Self Care | Payer: Medicare HMO | Attending: Internal Medicine | Admitting: Internal Medicine

## 2019-08-03 ENCOUNTER — Other Ambulatory Visit: Payer: Self-pay

## 2019-08-03 ENCOUNTER — Emergency Department: Payer: Medicare HMO

## 2019-08-03 DIAGNOSIS — N189 Chronic kidney disease, unspecified: Secondary | ICD-10-CM | POA: Insufficient documentation

## 2019-08-03 DIAGNOSIS — R55 Syncope and collapse: Secondary | ICD-10-CM | POA: Diagnosis not present

## 2019-08-03 DIAGNOSIS — Z20828 Contact with and (suspected) exposure to other viral communicable diseases: Secondary | ICD-10-CM | POA: Diagnosis not present

## 2019-08-03 DIAGNOSIS — E785 Hyperlipidemia, unspecified: Secondary | ICD-10-CM | POA: Diagnosis not present

## 2019-08-03 DIAGNOSIS — Z79899 Other long term (current) drug therapy: Secondary | ICD-10-CM | POA: Insufficient documentation

## 2019-08-03 DIAGNOSIS — R52 Pain, unspecified: Secondary | ICD-10-CM | POA: Diagnosis not present

## 2019-08-03 DIAGNOSIS — A044 Other intestinal Escherichia coli infections: Secondary | ICD-10-CM | POA: Diagnosis not present

## 2019-08-03 DIAGNOSIS — R1030 Lower abdominal pain, unspecified: Secondary | ICD-10-CM | POA: Insufficient documentation

## 2019-08-03 DIAGNOSIS — Z881 Allergy status to other antibiotic agents status: Secondary | ICD-10-CM | POA: Diagnosis not present

## 2019-08-03 DIAGNOSIS — R1084 Generalized abdominal pain: Secondary | ICD-10-CM | POA: Diagnosis not present

## 2019-08-03 DIAGNOSIS — Z7901 Long term (current) use of anticoagulants: Secondary | ICD-10-CM | POA: Diagnosis not present

## 2019-08-03 DIAGNOSIS — K529 Noninfective gastroenteritis and colitis, unspecified: Secondary | ICD-10-CM | POA: Diagnosis present

## 2019-08-03 DIAGNOSIS — I129 Hypertensive chronic kidney disease with stage 1 through stage 4 chronic kidney disease, or unspecified chronic kidney disease: Secondary | ICD-10-CM | POA: Insufficient documentation

## 2019-08-03 DIAGNOSIS — F419 Anxiety disorder, unspecified: Secondary | ICD-10-CM | POA: Insufficient documentation

## 2019-08-03 DIAGNOSIS — R69 Illness, unspecified: Secondary | ICD-10-CM | POA: Diagnosis not present

## 2019-08-03 DIAGNOSIS — Z885 Allergy status to narcotic agent status: Secondary | ICD-10-CM | POA: Insufficient documentation

## 2019-08-03 DIAGNOSIS — Z882 Allergy status to sulfonamides status: Secondary | ICD-10-CM | POA: Insufficient documentation

## 2019-08-03 DIAGNOSIS — Z880 Allergy status to penicillin: Secondary | ICD-10-CM | POA: Insufficient documentation

## 2019-08-03 DIAGNOSIS — I10 Essential (primary) hypertension: Secondary | ICD-10-CM | POA: Diagnosis not present

## 2019-08-03 DIAGNOSIS — A040 Enteropathogenic Escherichia coli infection: Principal | ICD-10-CM | POA: Insufficient documentation

## 2019-08-03 DIAGNOSIS — Z03818 Encounter for observation for suspected exposure to other biological agents ruled out: Secondary | ICD-10-CM | POA: Diagnosis not present

## 2019-08-03 DIAGNOSIS — Z888 Allergy status to other drugs, medicaments and biological substances status: Secondary | ICD-10-CM | POA: Insufficient documentation

## 2019-08-03 DIAGNOSIS — I959 Hypotension, unspecified: Secondary | ICD-10-CM | POA: Diagnosis not present

## 2019-08-03 DIAGNOSIS — Z886 Allergy status to analgesic agent status: Secondary | ICD-10-CM | POA: Insufficient documentation

## 2019-08-03 DIAGNOSIS — C9000 Multiple myeloma not having achieved remission: Secondary | ICD-10-CM | POA: Insufficient documentation

## 2019-08-03 DIAGNOSIS — J80 Acute respiratory distress syndrome: Secondary | ICD-10-CM | POA: Diagnosis not present

## 2019-08-03 LAB — COMPREHENSIVE METABOLIC PANEL
ALT: 20 U/L (ref 0–44)
AST: 23 U/L (ref 15–41)
Albumin: 3.4 g/dL — ABNORMAL LOW (ref 3.5–5.0)
Alkaline Phosphatase: 61 U/L (ref 38–126)
Anion gap: 8 (ref 5–15)
BUN: 22 mg/dL (ref 8–23)
CO2: 20 mmol/L — ABNORMAL LOW (ref 22–32)
Calcium: 9 mg/dL (ref 8.9–10.3)
Chloride: 111 mmol/L (ref 98–111)
Creatinine, Ser: 1.42 mg/dL — ABNORMAL HIGH (ref 0.44–1.00)
GFR calc Af Amer: 42 mL/min — ABNORMAL LOW (ref >60)
GFR calc non Af Amer: 37 mL/min — ABNORMAL LOW (ref >60)
Glucose, Bld: 141 mg/dL — ABNORMAL HIGH (ref 70–99)
Potassium: 3.3 mmol/L — ABNORMAL LOW (ref 3.5–5.1)
Sodium: 139 mmol/L (ref 135–145)
Total Bilirubin: 0.7 mg/dL (ref 0.3–1.2)
Total Protein: 5.3 g/dL — ABNORMAL LOW (ref 6.5–8.1)

## 2019-08-03 LAB — LIPASE, BLOOD: Lipase: 32 U/L (ref 11–51)

## 2019-08-03 LAB — CBC WITH DIFFERENTIAL/PLATELET
Abs Immature Granulocytes: 0.09 10*3/uL — ABNORMAL HIGH (ref 0.00–0.07)
Basophils Absolute: 0.1 10*3/uL (ref 0.0–0.1)
Basophils Relative: 1 %
Eosinophils Absolute: 0.2 10*3/uL (ref 0.0–0.5)
Eosinophils Relative: 1 %
HCT: 38.7 % (ref 36.0–46.0)
Hemoglobin: 13.1 g/dL (ref 12.0–15.0)
Immature Granulocytes: 1 %
Lymphocytes Relative: 10 %
Lymphs Abs: 1.4 10*3/uL (ref 0.7–4.0)
MCH: 32.1 pg (ref 26.0–34.0)
MCHC: 33.9 g/dL (ref 30.0–36.0)
MCV: 94.9 fL (ref 80.0–100.0)
Monocytes Absolute: 1.8 10*3/uL — ABNORMAL HIGH (ref 0.1–1.0)
Monocytes Relative: 13 %
Neutro Abs: 10.9 10*3/uL — ABNORMAL HIGH (ref 1.7–7.7)
Neutrophils Relative %: 74 %
Platelets: 130 10*3/uL — ABNORMAL LOW (ref 150–400)
RBC: 4.08 MIL/uL (ref 3.87–5.11)
RDW: 14.4 % (ref 11.5–15.5)
WBC: 14.5 10*3/uL — ABNORMAL HIGH (ref 4.0–10.5)
nRBC: 0 % (ref 0.0–0.2)

## 2019-08-03 LAB — C DIFFICILE QUICK SCREEN W PCR REFLEX
C Diff antigen: NEGATIVE
C Diff interpretation: NOT DETECTED
C Diff toxin: NEGATIVE

## 2019-08-03 LAB — GASTROINTESTINAL PANEL BY PCR, STOOL (REPLACES STOOL CULTURE)

## 2019-08-03 LAB — SARS CORONAVIRUS 2 (TAT 6-24 HRS): SARS Coronavirus 2: NEGATIVE

## 2019-08-03 MED ORDER — DICYCLOMINE HCL 10 MG PO CAPS
10.0000 mg | ORAL_CAPSULE | Freq: Three times a day (TID) | ORAL | Status: DC
  Administered 2019-08-03 – 2019-08-04 (×4): 10 mg via ORAL
  Filled 2019-08-03 (×6): qty 1

## 2019-08-03 MED ORDER — DICYCLOMINE HCL 10 MG PO CAPS
ORAL_CAPSULE | ORAL | Status: AC
  Filled 2019-08-03: qty 1

## 2019-08-03 MED ORDER — APIXABAN 5 MG PO TABS
5.0000 mg | ORAL_TABLET | Freq: Two times a day (BID) | ORAL | Status: DC
  Administered 2019-08-03 – 2019-08-04 (×2): 5 mg via ORAL
  Filled 2019-08-03 (×2): qty 1

## 2019-08-03 MED ORDER — ALPRAZOLAM 0.25 MG PO TABS: 0.2500 mg | ORAL_TABLET | Freq: Every evening | ORAL | Status: DC | PRN

## 2019-08-03 MED ORDER — TRAMADOL HCL 50 MG PO TABS: 50.0000 mg | ORAL_TABLET | Freq: Four times a day (QID) | ORAL | Status: DC | PRN

## 2019-08-03 MED ORDER — ATORVASTATIN CALCIUM 10 MG PO TABS
10.0000 mg | ORAL_TABLET | Freq: Every day | ORAL | Status: DC
  Administered 2019-08-03 – 2019-08-04 (×2): 10 mg via ORAL
  Filled 2019-08-03 (×2): qty 1

## 2019-08-03 MED ORDER — IPRATROPIUM-ALBUTEROL 0.5-2.5 (3) MG/3ML IN SOLN: 3.0000 mL | Freq: Four times a day (QID) | RESPIRATORY_TRACT | Status: DC | PRN

## 2019-08-03 MED ORDER — POLYETHYLENE GLYCOL 3350 17 G PO PACK: 17.0000 g | PACK | Freq: Every day | ORAL | Status: DC | PRN

## 2019-08-03 MED ORDER — OCUVITE-LUTEIN PO CAPS
1.0000 | ORAL_CAPSULE | Freq: Every morning | ORAL | Status: DC
  Administered 2019-08-04: 10:00:00 1 via ORAL
  Filled 2019-08-03: qty 1

## 2019-08-03 MED ORDER — ONDANSETRON HCL 4 MG/2ML IJ SOLN
4.0000 mg | Freq: Once | INTRAMUSCULAR | Status: AC
  Administered 2019-08-03: 4 mg via INTRAVENOUS
  Filled 2019-08-03: qty 2

## 2019-08-03 MED ORDER — FLUTICASONE PROPIONATE 50 MCG/ACT NA SUSP
1.0000 | Freq: Every day | NASAL | Status: DC
  Filled 2019-08-03: qty 16

## 2019-08-03 MED ORDER — CHOLECALCIFEROL 10 MCG (400 UNIT) PO TABS
400.0000 [IU] | ORAL_TABLET | Freq: Every day | ORAL | Status: DC
  Administered 2019-08-04: 10:00:00 400 [IU] via ORAL
  Filled 2019-08-03 (×2): qty 1

## 2019-08-03 MED ORDER — SODIUM CHLORIDE 0.9 % IV SOLN
500.0000 mg | INTRAVENOUS | Status: DC
  Administered 2019-08-04: 500 mg via INTRAVENOUS
  Filled 2019-08-03: qty 500

## 2019-08-03 MED ORDER — POTASSIUM CHLORIDE IN NACL 20-0.9 MEQ/L-% IV SOLN
INTRAVENOUS | Status: DC
  Administered 2019-08-03 – 2019-08-04 (×2): via INTRAVENOUS
  Filled 2019-08-03 (×3): qty 1000

## 2019-08-03 MED ORDER — VALACYCLOVIR HCL 500 MG PO TABS
500.0000 mg | ORAL_TABLET | Freq: Every day | ORAL | Status: DC
  Administered 2019-08-03 – 2019-08-04 (×2): 500 mg via ORAL
  Filled 2019-08-03 (×2): qty 1

## 2019-08-03 MED ORDER — MORPHINE SULFATE (PF) 2 MG/ML IV SOLN
2.0000 mg | Freq: Once | INTRAVENOUS | Status: AC
  Administered 2019-08-03: 2 mg via INTRAVENOUS

## 2019-08-03 MED ORDER — DEXAMETHASONE 4 MG PO TABS: 12.0000 mg | ORAL_TABLET | ORAL | Status: DC

## 2019-08-03 MED ORDER — DICYCLOMINE HCL 10 MG PO CAPS
10.0000 mg | ORAL_CAPSULE | Freq: Once | ORAL | Status: AC
  Administered 2019-08-03: 10 mg via ORAL

## 2019-08-03 MED ORDER — POMALIDOMIDE 4 MG PO CAPS: 4.0000 mg | ORAL_CAPSULE | Freq: Every day | ORAL | Status: DC

## 2019-08-03 MED ORDER — GUAIFENESIN-CODEINE 100-10 MG/5ML PO SOLN: 5.0000 mL | Freq: Four times a day (QID) | ORAL | Status: DC | PRN

## 2019-08-03 MED ORDER — CALCIUM CARBONATE-VITAMIN D 500-200 MG-UNIT PO TABS
1.0000 | ORAL_TABLET | Freq: Every day | ORAL | Status: DC
  Administered 2019-08-04: 1 via ORAL
  Filled 2019-08-03: qty 1

## 2019-08-03 MED ORDER — ACETAMINOPHEN 500 MG PO TABS: 500.0000 mg | ORAL_TABLET | Freq: Four times a day (QID) | ORAL | Status: DC | PRN

## 2019-08-03 MED ORDER — SODIUM CHLORIDE 0.9 % IV BOLUS
1000.0000 mL | Freq: Once | INTRAVENOUS | Status: AC
  Administered 2019-08-03: 1000 mL via INTRAVENOUS

## 2019-08-03 MED ORDER — MORPHINE SULFATE (PF) 2 MG/ML IV SOLN
INTRAVENOUS | Status: AC
  Filled 2019-08-03: qty 1

## 2019-08-03 MED ORDER — LOPERAMIDE HCL 2 MG PO CAPS
4.0000 mg | ORAL_CAPSULE | Freq: Once | ORAL | Status: AC
  Administered 2019-08-03: 4 mg via ORAL
  Filled 2019-08-03: qty 2

## 2019-08-03 MED ORDER — MORPHINE SULFATE (PF) 2 MG/ML IV SOLN
2.0000 mg | Freq: Once | INTRAVENOUS | Status: AC
  Administered 2019-08-03: 2 mg via INTRAVENOUS
  Filled 2019-08-03: qty 1

## 2019-08-03 MED ORDER — SODIUM CHLORIDE 0.9 % IV BOLUS: 1000.0000 mL | Freq: Once | INTRAVENOUS | Status: DC

## 2019-08-03 MED ORDER — PANTOPRAZOLE SODIUM 40 MG PO TBEC
40.0000 mg | DELAYED_RELEASE_TABLET | Freq: Every day | ORAL | Status: DC
  Administered 2019-08-04: 10:00:00 40 mg via ORAL
  Filled 2019-08-03: qty 1

## 2019-08-03 MED ORDER — ONDANSETRON 4 MG PO TBDP
4.0000 mg | ORAL_TABLET | Freq: Three times a day (TID) | ORAL | Status: DC | PRN
  Filled 2019-08-03: qty 1

## 2019-08-03 MED ORDER — SENNA 8.6 MG PO TABS: 1.0000 | ORAL_TABLET | Freq: Every day | ORAL | Status: DC | PRN

## 2019-08-03 MED ORDER — ALBUTEROL SULFATE (2.5 MG/3ML) 0.083% IN NEBU: 2.5000 mg | INHALATION_SOLUTION | Freq: Four times a day (QID) | RESPIRATORY_TRACT | Status: DC | PRN

## 2019-08-03 MED ORDER — MORPHINE SULFATE (PF) 2 MG/ML IV SOLN: 2.0000 mg | INTRAVENOUS | Status: DC | PRN

## 2019-08-03 MED ORDER — DIPHENHYDRAMINE HCL 25 MG PO CAPS: 25.0000 mg | ORAL_CAPSULE | Freq: Every day | ORAL | Status: DC | PRN

## 2019-08-03 MED ORDER — AZITHROMYCIN 500 MG PO TABS
500.0000 mg | ORAL_TABLET | Freq: Once | ORAL | Status: AC
  Administered 2019-08-03: 500 mg via ORAL
  Filled 2019-08-03: qty 1

## 2019-08-03 MED ORDER — PROCHLORPERAZINE MALEATE 5 MG PO TABS
5.0000 mg | ORAL_TABLET | Freq: Three times a day (TID) | ORAL | Status: DC | PRN
  Filled 2019-08-03: qty 1

## 2019-08-03 NOTE — ED Notes (Signed)
Pt asking to get back on the commode. Pt is hypotensive at this time. Pt placed on bedpan instead. Pt is able to lift her hips. Pt reports a decrease in pain when pt bears down to have a bowel movement.

## 2019-08-03 NOTE — ED Notes (Signed)
ED TO INPATIENT HANDOFF REPORT  ED Nurse Name and Phone #: bill [phone]  S Name/Age/Gender Sherry Oneal 73 y.o. female Room/Bed: ED32A/ED32A  Code Status   Code Status: Full Code  Home/SNF/Other Home Patient oriented to: self, place, time and situation Is this baseline? Yes   Triage Complete: Triage complete  Chief Complaint [facility]  Abdominal pain  Triage Note Pt to the er for abd pain, pt constipated yesterday. 3 normal BM today. Pt in obvious pain. Pain is coming in waves. Pain in is the lower abdomen. Pt has a hx of cancer and is undergoing tx.   Allergies Allergies  Allergen Reactions  . Aspirin Other (See Comments)    Causes internal bleeding per patient  . Atenolol     "wierd feeling"  . Doxycycline     Upset stomach   . Epinephrine     "Shaking"  . Ivp Dye [Iodinated Diagnostic Agents] Hypertension    Per tech conversation with PT, pt states that the last time she had IV dye she had increased BP and tachycardia.     Marland Kitchen Ketorolac   . Nsaids     Bleeding  . Penicillins     rash  . Sulfa Antibiotics     Rash   . Tolmetin     Bleeding  . Tramadol     Numb feeling   . Procaine Palpitations    States this was added due to palpitations and shakes after receiving local anesthetic containing epinephrine.     Level of Care/Admitting Diagnosis ED Disposition    ED Disposition Condition [facility] Area: [place] [[id number]]  Level of Care: Med-Surg [16]  Covid Evaluation: Person Under Investigation (PUI)  Diagnosis: Acute colitis [960454]  Admitting Physician: Nicholes Mango [[id number]]  Attending Physician: Nicholes Mango [[id number]]  Estimated length of stay: 3 - 4 days  Certification:: I certify this patient will need inpatient services for at least 2 midnights  Bed request comments: 2c/1c  PT Class (Do Not Modify): Inpatient [101]  PT Acc Code (Do Not Modify): Private [1]       B Medical/Surgery History Past  Medical History:  Diagnosis Date  . Anemia   . Arthritis    lower back  . High blood pressure   . High cholesterol   . Multiple myeloma ([place])   . Renal insufficiency   . Wears dentures    partial upper   Past Surgical History:  Procedure Laterality Date  . CATARACT EXTRACTION W/PHACO Right 08/17/2018   Procedure: CATARACT EXTRACTION PHACO AND INTRAOCULAR LENS PLACEMENT ([place])  RIGHT;  Surgeon: Leandrew Koyanagi, MD;  Location: [place];  Service: Ophthalmology;  Laterality: Right;  . CATARACT EXTRACTION W/PHACO Left 11/09/2018   Procedure: CATARACT EXTRACTION PHACO AND INTRAOCULAR LENS PLACEMENT ([place]) LEFT;  Surgeon: Leandrew Koyanagi, MD;  Location: [place];  Service: Ophthalmology;  Laterality: Left;  . EXPLORATORY LAPAROTOMY       A IV Location/Drains/Wounds Patient Lines/Drains/Airways Status   Active Line/Drains/Airways    Name:   Placement date:   Placement time:   Site:   Days:   Peripheral IV 08/03/19 Left;Posterior Hand   08/03/19    0315    Hand   less than 1   Peripheral IV 08/03/19 Left Wrist   08/03/19    0414    Wrist   less than 1   Incision (Closed) 08/17/18 Eye Right   08/17/18    1001  351   Incision (Closed) 11/09/18 Eye Left   11/09/18    0744     267          Intake/Output Last 24 hours  Intake/Output Summary (Last 24 hours) at 08/03/2019 1619 Last data filed at 08/03/2019 0314 Gross per 24 hour  Intake 500 ml  Output -  Net 500 ml    Labs/Imaging Results for orders placed or performed during the hospital encounter of 08/03/19 (from the past 48 hour(s))  CBC with Differential     Status: Abnormal   Collection Time: 08/03/19  3:29 AM  Result Value Ref Range   WBC 14.5 (H) 4.0 - 10.5 K/uL   RBC 4.08 3.87 - 5.11 MIL/uL   Hemoglobin 13.1 12.0 - 15.0 g/dL   HCT 38.7 36.0 - 46.0 %   MCV 94.9 80.0 - 100.0 fL   MCH 32.1 26.0 - 34.0 pg   MCHC 33.9 30.0 - 36.0 g/dL   RDW 14.4 11.5 - 15.5 %   Platelets 130 (L) 150 - 400  K/uL   nRBC 0.0 0.0 - 0.2 %   Neutrophils Relative % 74 %   Neutro Abs 10.9 (H) 1.7 - 7.7 K/uL   Lymphocytes Relative 10 %   Lymphs Abs 1.4 0.7 - 4.0 K/uL   Monocytes Relative 13 %   Monocytes Absolute 1.8 (H) 0.1 - 1.0 K/uL   Eosinophils Relative 1 %   Eosinophils Absolute 0.2 0.0 - 0.5 K/uL   Basophils Relative 1 %   Basophils Absolute 0.1 0.0 - 0.1 K/uL   Immature Granulocytes 1 %   Abs Immature Granulocytes 0.09 (H) 0.00 - 0.07 K/uL    Comment: Performed at [facility], [address] [place], [place] [address]  Comprehensive metabolic panel     Status: Abnormal   Collection Time: 08/03/19  3:52 AM  Result Value Ref Range   Sodium 139 135 - 145 mmol/L   Potassium 3.3 (L) 3.5 - 5.1 mmol/L   Chloride 111 98 - 111 mmol/L   CO2 20 (L) 22 - 32 mmol/L   Glucose, Bld 141 (H) 70 - 99 mg/dL   BUN 22 8 - 23 mg/dL   Creatinine, Ser 1.42 (H) 0.44 - 1.00 mg/dL   Calcium 9.0 8.9 - 10.3 mg/dL   Total Protein 5.3 (L) 6.5 - 8.1 g/dL   Albumin 3.4 (L) 3.5 - 5.0 g/dL   AST 23 15 - 41 U/L   ALT 20 0 - 44 U/L   Alkaline Phosphatase 61 38 - 126 U/L   Total Bilirubin 0.7 0.3 - 1.2 mg/dL   GFR calc non Af Amer 37 (L) >60 mL/min   GFR calc Af Amer 42 (L) >60 mL/min   Anion gap 8 5 - 15    Comment: Performed at [facility], [address] [place], [place] [address]  Lipase, blood     Status: None   Collection Time: 08/03/19  3:52 AM  Result Value Ref Range   Lipase 32 11 - 51 U/L    Comment: Performed at [facility], [address] [place], [place] [address]  Gastrointestinal Panel by PCR , Stool     Status: Abnormal   Collection Time: 08/03/19  3:52 AM   Specimen: Stool  Result Value Ref Range   Campylobacter species NOT DETECTED NOT DETECTED   Plesimonas shigelloides NOT DETECTED NOT DETECTED   Salmonella species NOT DETECTED NOT DETECTED   Yersinia enterocolitica NOT DETECTED NOT DETECTED  Vibrio species NOT DETECTED NOT DETECTED   Vibrio cholerae  NOT DETECTED NOT DETECTED   Enteroaggregative E coli (EAEC) NOT DETECTED NOT DETECTED   Enteropathogenic E coli (EPEC) DETECTED (A) NOT DETECTED    Comment: RESULT CALLED TO, READ BACK BY AND VERIFIED WITH: Lorretta Harp RN [phone] 08/03/2019 [facility]    Enterotoxigenic E coli (ETEC) NOT DETECTED NOT DETECTED   Shiga like toxin producing E coli (STEC) NOT DETECTED NOT DETECTED   Shigella/Enteroinvasive E coli (EIEC) NOT DETECTED NOT DETECTED   Cryptosporidium NOT DETECTED NOT DETECTED   Cyclospora cayetanensis NOT DETECTED NOT DETECTED   Entamoeba histolytica NOT DETECTED NOT DETECTED   Giardia lamblia NOT DETECTED NOT DETECTED   Adenovirus F40/41 NOT DETECTED NOT DETECTED   Astrovirus NOT DETECTED NOT DETECTED   Norovirus GI/GII NOT DETECTED NOT DETECTED   Rotavirus A NOT DETECTED NOT DETECTED   Sapovirus (I, II, IV, and V) NOT DETECTED NOT DETECTED    Comment: Performed at [facility], [address] [place], [place] [address]  C difficile quick scan w PCR reflex     Status: None   Collection Time: 08/03/19  3:52 AM   Specimen: Stool  Result Value Ref Range   C Diff antigen NEGATIVE NEGATIVE   C Diff toxin NEGATIVE NEGATIVE   C Diff interpretation No C. difficile detected.     Comment: Performed at [facility], [address] [place], [place] [address]   Ct Abdomen Pelvis Wo Contrast  Result Date: 08/03/2019 CLINICAL DATA:  Abdominal pain with diverticulitis suspected. Constipated yesterday with 3 bowel movements today. EXAM: CT ABDOMEN AND PELVIS WITHOUT CONTRAST TECHNIQUE: Multidetector CT imaging of the abdomen and pelvis was performed following the standard protocol without IV contrast. COMPARISON:  01/30/2017 FINDINGS: Lower chest:  Mild atelectasis in the subpleural lungs. Hepatobiliary: No focal liver abnormality.No evidence of biliary obstruction or stone. Pancreas: Unremarkable. Spleen: Unremarkable. Adrenals/Urinary Tract: Negative adrenals. Lobulated renal  cortex with small volume left kidney, consistent with scarring. There are scattered parenchymal calcifications associated with the areas of scarring. No hydronephrosis or ureteral stone. Unremarkable bladder. Stomach/Bowel: The colon diffusely contains stool and/or fluid levels with mild wall thickening and pericolonic fat haziness. Mild distal colonic diverticulosis. Vascular/Lymphatic: Atherosclerotic calcification. No mass or adenopathy. Reproductive:No acute finding Other: No ascites or pneumoperitoneum. Musculoskeletal: No acute abnormalities. IMPRESSION: 1. Colitis with generalized colonic distention by stool and fluid levels. 2. Left more than right renal scarring and parenchymal calcification. Electronically Signed   By: Monte Fantasia M.D.   On: 08/03/2019 05:36    Pending Labs Unresulted Labs (From admission, onward)    Start     Ordered   08/04/19 0500  Comprehensive metabolic panel  Tomorrow morning,   STAT     08/03/19 1615   08/04/19 0500  CBC  Tomorrow morning,   STAT     08/03/19 1615   08/03/19 0830  SARS CORONAVIRUS 2 Nasal Swab Aptima Multi Swab  (Asymptomatic/Tier 2)  Once,   STAT    Question Answer Comment  Is this test for diagnosis or screening Screening   Symptomatic for COVID-19 as defined by CDC No   Hospitalized for COVID-19 No   Admitted to ICU for COVID-19 No   Previously tested for COVID-19 No   Resident in a congregate (group) care setting No   Employed in healthcare setting No   Pregnant No      08/03/19 0830          Vitals/Pain  Today's Vitals   08/03/19 1130 08/03/19 1200 08/03/19 1315 08/03/19 1330  BP: (!) 91/44 (!) 106/46 (!) 105/49 (!) 106/45  Pulse: 64 69    Resp: 20 (!) 23 15 (!) 23  Temp:      TempSrc:      SpO2: 95% 98%    Weight:      Height:      PainSc:        Isolation Precautions Enteric precautions (UV disinfection)  Medications Medications  acetaminophen (TYLENOL) tablet 500 mg (has no administration in time range)   valACYclovir (VALTREX) tablet 500 mg (has no administration in time range)  pomalidomide (POMALYST) capsule 4 mg (has no administration in time range)  atorvastatin (LIPITOR) tablet 10 mg (has no administration in time range)  ALPRAZolam (XANAX) tablet 0.25 mg (has no administration in time range)  dexamethasone (DECADRON) tablet 12 mg (has no administration in time range)  prochlorperazine (COMPAZINE) tablet 5 mg (has no administration in time range)  Calcium Carbonate-Vitamin D 600-400 MG-UNIT 1 tablet (has no administration in time range)  diphenhydrAMINE (BENADRYL) capsule 25 mg (has no administration in time range)  pantoprazole (PROTONIX) EC tablet 40 mg (has no administration in time range)  polyethylene glycol (MIRALAX / GLYCOLAX) packet 17 g (has no administration in time range)  PreserVision AREDS CAPS (has no administration in time range)  ondansetron (ZOFRAN-ODT) disintegrating tablet 4 mg (has no administration in time range)  apixaban (ELIQUIS) tablet 5 mg (has no administration in time range)  albuterol (VENTOLIN HFA) 108 (90 Base) MCG/ACT inhaler 2 puff (has no administration in time range)  senna (SENOKOT) tablet 8.6 mg (has no administration in time range)  fluticasone (FLONASE) 50 MCG/ACT nasal spray 1 spray (has no administration in time range)  guaiFENesin-codeine (ROBITUSSIN AC) 100-10 MG/5ML syrup 5 mL (has no administration in time range)  ipratropium-albuterol (DUONEB) 0.5-2.5 (3) MG/3ML nebulizer solution 3 mL (has no administration in time range)  cholecalciferol (VITAMIN D3) tablet 400 Units (has no administration in time range)  azithromycin (ZITHROMAX) 500 mg in sodium chloride 0.9 % 250 mL IVPB (has no administration in time range)  0.9 % NaCl with KCl 20 mEq/ L  infusion (has no administration in time range)  traMADol (ULTRAM) tablet 50 mg (has no administration in time range)  morphine 2 MG/ML injection 2 mg (has no administration in time range)  dicyclomine  (BENTYL) capsule 10 mg (has no administration in time range)  morphine 2 MG/ML injection 2 mg (2 mg Intravenous Given 08/03/19 0330)  ondansetron (ZOFRAN) injection 4 mg (4 mg Intravenous Given 08/03/19 0331)  sodium chloride 0.9 % bolus 1,000 mL (0 mLs Intravenous Stopped 08/03/19 0500)  sodium chloride 0.9 % bolus 1,000 mL (0 mLs Intravenous Stopped 08/03/19 0619)  loperamide (IMODIUM) capsule 4 mg (4 mg Oral Given 08/03/19 0605)  azithromycin (ZITHROMAX) tablet 500 mg (500 mg Oral Given 08/03/19 8299)  dicyclomine (BENTYL) capsule 10 mg (10 mg Oral Given 08/03/19 0608)  morphine 2 MG/ML injection 2 mg (2 mg Intravenous Given 08/03/19 3716)    Mobility walks with person assist Low fall risk   Focused Assessments 1   R Recommendations: See Admitting Provider Note  Report given to:   Additional Notes:

## 2019-08-03 NOTE — Progress Notes (Signed)
Family Meeting Note  Advance Directive:yes  Today a meeting took place with the Patient.   The following clinical team members were present during this meeting:MD  The following were discussed:Patient's diagnosis: Acute abdominal pain, acute enteropathogenic E. coli colitis, other medical problems hypertension hyperlipidemia discussed in treatment plan of care discussed in detail with the patient.  She verbalized understanding of the plan.     Patient's progosis: Unable to determine and Goals for treatment: Full Code  Husband is the healthcare power of attorney  Additional follow-up to be provided: Hospitalist  Time spent during discussion:17 min  Nicholes Mango, MD

## 2019-08-03 NOTE — ED Notes (Signed)
Bedpan emptied for the 4th time. Pt has reddish brown blood diarrhea. Pain has improved. One episode of formed stool and 3 episodes of diarrhea total at this time.

## 2019-08-03 NOTE — ED Notes (Signed)
Pt oob with 1 assist to BR

## 2019-08-03 NOTE — ED Notes (Signed)
Pt husband is going home. Husband telephone number is [phone]

## 2019-08-03 NOTE — ED Notes (Signed)
Pt has had another small episode of diarrhea.

## 2019-08-03 NOTE — ED Notes (Signed)
Pt taken off the bedpan again. Pt having pain again. Pt given water to drink.

## 2019-08-03 NOTE — ED Notes (Addendum)
Pt taken off bedpan by this tech. Pt has large size bowel movement with some stool formed and some diarrhea. Pt cleaned and resting in bed at this time.

## 2019-08-03 NOTE — ED Triage Notes (Signed)
Pt to the er for abd pain, pt constipated yesterday. 3 normal BM today. Pt in obvious pain. Pain is coming in waves. Pain in is the lower abdomen. Pt has a hx of cancer and is undergoing tx.

## 2019-08-03 NOTE — ED Notes (Signed)
Pt placed back on the bedpan. Pt states she is having watery diarrhea at this time. Pt c/o smell. Coffee diffuser placed in room. Husband  given a cup of coffee per request.

## 2019-08-03 NOTE — ED Notes (Addendum)
Pt having severe pain at this time. Pt asking to get up to the bedside commode. Pt is monaing in pain. Pt is pale and is leaning over while on the commode. Dr Owens Shark asking for a sample. Pt has a medium sized bwoel movement with formed stool. No diarrhea present.

## 2019-08-03 NOTE — ED Provider Notes (Signed)
[facility] Emergency Department Provider Note ____   First MD Initiated Contact with Patient 08/03/19 [phone]     (approximate)  I have reviewed the triage vital signs and the nursing notes.   HISTORY  Chief Complaint Abdominal Pain  HPI Sherry Oneal is a 73 y.o. female with below list of previous medical conditions including currently being treated for multiple myeloma presents to the emergency department with acute onset of intermittent 10 out of 10 lower abdominal discomfort that the patient states is cramping in nature.  Patient denies any diarrhea, no fever no urinary.  Patient denies any blood noted in previous bowel movements today.        Past Medical History:  Diagnosis Date  . Anemia   . Arthritis    lower back  . High blood pressure   . High cholesterol   . Multiple myeloma ([place])   . Renal insufficiency   . Wears dentures    partial upper    Patient Active Problem List   Diagnosis Date Noted  . Screening for breast cancer 11/17/2018  . Dysuria 11/17/2018  . Acute pharyngitis 08/26/2018  . Sore throat 08/26/2018  . Generalized anxiety disorder 06/13/2018  . HCAP (healthcare-associated pneumonia) 03/22/2018  . Acute bronchitis 03/01/2018  . Cough 03/01/2018  . Flu-like symptoms 03/01/2018  . High blood pressure 03/01/2018  . Hypogammaglobulinemia, acquired ([place]) 01/11/2018  . Exertional dyspnea 09/03/2017  . Pancytopenia due to chemotherapy ([place]) 05/18/2017  . LGI bleed 01/30/2017  . Acute blood loss anemia 01/30/2017  . Abdominal pain 01/30/2017  . Multiple myeloma ([place]) 01/30/2017  . Hyperlipidemia 10/29/2016  . Nephrolithiasis 04/04/2013  . 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 CATARACT EXTRACTION W/PHACO Right 08/17/2018   Procedure: CATARACT EXTRACTION PHACO AND INTRAOCULAR LENS PLACEMENT ([place])  RIGHT;  Surgeon: Leandrew Koyanagi, MD;  Location:  [facility];  Service: Ophthalmology;  Laterality: Right;  . CATARACT EXTRACTION W/PHACO Left 11/09/2018   Procedure: CATARACT EXTRACTION PHACO AND INTRAOCULAR LENS PLACEMENT ([place]) LEFT;  Surgeon: Leandrew Koyanagi, MD;  Location: [facility];  Service: Ophthalmology;  Laterality: Left;  . EXPLORATORY LAPAROTOMY      Prior to Admission medications   Medication Sig Start Date End Date Taking? Authorizing Provider  acetaminophen (TYLENOL) 500 MG tablet Take 500 mg by mouth every 4 (four) hours.    [provider]  albuterol (PROVENTIL HFA;VENTOLIN HFA) 108 (90 Base) MCG/ACT inhaler Inhale 2 puffs into the lungs every 6 (six) hours as needed for wheezing or shortness of breath. 03/24/18   Hillary Bow, MD  ALPRAZolam Duanne Moron) 0.25 MG tablet Take 1 tablet (0.25 mg total) by mouth at bedtime as needed. 11/17/18   Ronnell Freshwater, NP  atorvastatin (LIPITOR) 10 MG tablet Take 1 tablet (10 mg total) by mouth daily. 07/04/19   Ronnell Freshwater, NP  Calcium Carbonate-Vitamin D 600-400 MG-UNIT tablet Take 1 tablet by mouth daily.    [provider]  ciprofloxacin-dexamethasone (CIPRODEX) OTIC suspension Place 4 drops into the left ear 2 (two) times daily. 03/14/19   Kendell Bane, NP  Cranberry 500 MG CAPS Take 500 mg by mouth every morning.    [provider]  DARATUMUMAB IV Inject into the vein every 30 (thirty) days.    [provider]  dexamethasone (DECADRON) 6 MG tablet Take 12 mg by mouth once a week. Takes on Tues evenings  [provider]  dextromethorphan-guaiFENesin (TUSSIN DM) 10-100 MG/5ML liquid Take 10 mLs by mouth every 4 (four) hours as needed for cough. 03/14/19   Kendell Bane, NP  diphenhydrAMINE (BENADRYL ALLERGY) 25 MG tablet Take 25 mg by mouth daily as needed.    [provider]  docusate (COLACE) 50 MG/5ML liquid Take by mouth daily as needed for mild constipation.    [provider]  ELIQUIS 5  MG TABS tablet Take 1 tablet by mouth 2 (two) times daily. 02/21/18   [provider]  fluticasone (FLONASE) 50 MCG/ACT nasal spray Place into both nostrils daily.    [provider]  guaiFENesin-codeine (VIRTUSSIN A/C) 100-10 MG/5ML syrup Take 5 mLs by mouth every 6 (six) hours as needed for cough.    [provider]  guaiFENesin-codeine 100-10 MG/5ML syrup Take 5 mLs by mouth every 6 (six) hours as needed for cough. 03/24/18   Sudini, Alveta Heimlich, MD  ipratropium-albuterol (DUONEB) 0.5-2.5 (3) MG/3ML SOLN Take 3 mLs by nebulization every 6 (six) hours as needed. 03/14/19   Kendell Bane, NP  levofloxacin (LEVAQUIN) 500 MG tablet Take 1 tablet (500 mg total) by mouth daily. 03/14/19   Kendell Bane, NP  lisinopril (ZESTRIL) 10 MG tablet Take 1 tablet (10 mg total) by mouth daily. 05/10/19   Ronnell Freshwater, NP  Multiple Vitamins-Minerals (PRESERVISION AREDS PO) Take 1 tablet by mouth every morning.    [provider]  omeprazole (PRILOSEC) 20 MG capsule Take 20 mg by mouth daily. 01/20/17   [provider]  ondansetron (ZOFRAN-ODT) 4 MG disintegrating tablet DISSOLVE 1 TABLET IN MOUTH EVERY 8 HOURS AS NEEDED FOR NAUSEA 12/20/17   [provider]  Phenylephrine-DM-GG (TUSSIN CF PO) Take by mouth as needed.    [provider]  polyethylene glycol (MIRALAX / GLYCOLAX) packet Take 17 g by mouth daily as needed.    [provider]  pomalidomide (POMALYST) 4 MG capsule Take 1 capsule by mouth once daily for days 1-21. 7 days rest. 02/16/18   [provider]  predniSONE (DELTASONE) 10 MG tablet Use per dose pack 03/14/19   Scarboro, Audie Clear, NP  prochlorperazine (COMPAZINE) 5 MG tablet Take 5 mg by mouth every 8 (eight) hours as needed. 12/04/16   [provider]  senna (SENOKOT) 8.6 MG tablet Take 1 tablet by mouth daily as needed for constipation.    [provider]  valACYclovir (VALTREX) 500 MG tablet Take 500  mg by mouth daily. 01/20/17   [provider]  Zoledronic Acid (ZOMETA IV) Inject into the vein every 30 (thirty) days.    [provider]    Allergies Aspirin, Atenolol, Doxycycline, Epinephrine, Ivp dye [iodinated diagnostic agents], Ketorolac, Nsaids, Penicillins, Sulfa antibiotics, Tolmetin, Tramadol, and Procaine  Family History  Problem Relation Age of Onset  . Breast cancer Maternal Grandmother 81  . CAD Mother   . CVA Mother     Social History Social History   Tobacco Use  . Smoking status: Never Smoker  . Smokeless tobacco: Never Used  Substance Use Topics  . Alcohol use: No  . Drug use: No    Review of Systems Constitutional: No fever/chills Eyes: No visual changes. ENT: No sore throat. Cardiovascular: Denies chest pain. Respiratory: Denies shortness of breath. Gastrointestinal: Positive for abdominal pain.  No nausea, no vomiting.  No diarrhea.  No constipation. Genitourinary: Negative for dysuria. Musculoskeletal: Negative for neck pain.  Negative for back pain. Integumentary: Negative for rash.  Neurological: Negative for headaches, focal weakness or numbness.  ____________________________________________   PHYSICAL EXAM:  VITAL SIGNS: ED Triage Vitals  Enc Vitals Group     BP 08/03/19 0328 (!) 122/35     Pulse Rate 08/03/19 0328 77     Resp 08/03/19 0328 18     Temp 08/03/19 0328 97.8 F (36.6 C)     Temp Source 08/03/19 0328 Oral     SpO2 08/03/19 0314 97 %     Weight 08/03/19 0317 62.1 kg (137 lb)     Height 08/03/19 0317 1.626 m (5' 4" )     Head Circumference --      Peak Flow --      Pain Score 08/03/19 0316 3     Pain Loc --      Pain Edu? --      Excl. in [place]? --     Constitutional: Alert and oriented.  Eyes: Conjunctivae are normal. . Mouth/Throat: Mucous membranes are moist. Neck: No stridor.  No meningeal signs.   Cardiovascular: Normal rate, regular rhythm. Good peripheral circulation. Grossly normal heart  sounds. Respiratory: Normal respiratory effort.  No retractions. Gastrointestinal: Left lower quadrant/right lower quadrant tenderness to palpation.  No distention.  Musculoskeletal: No lower extremity tenderness nor edema. No gross deformities of extremities. Neurologic:  Normal speech and language. No gross focal neurologic deficits are appreciated.  Skin:  Skin is warm, dry and intact. Psychiatric: Mood and affect are normal. Speech and behavior are normal.  ____________________________________________   LABS (all labs ordered are listed, but only abnormal results are displayed)  Labs Reviewed  GASTROINTESTINAL PANEL BY PCR, STOOL (REPLACES STOOL CULTURE) - Abnormal; Notable for the following components:      Result Value   Enteropathogenic E coli (EPEC) DETECTED (*)    All other components within normal limits  CBC WITH DIFFERENTIAL/PLATELET - Abnormal; Notable for the following components:   WBC 14.5 (*)    Platelets 130 (*)    Neutro Abs 10.9 (*)    Monocytes Absolute 1.8 (*)    Abs Immature Granulocytes 0.09 (*)    All other components within normal limits  COMPREHENSIVE METABOLIC PANEL - Abnormal; Notable for the following components:   Potassium 3.3 (*)    CO2 20 (*)    Glucose, Bld 141 (*)    Creatinine, Ser 1.42 (*)    Total Protein 5.3 (*)    Albumin 3.4 (*)    GFR calc non Af Amer 37 (*)    GFR calc Af Amer 42 (*)    All other components within normal limits  C DIFFICILE QUICK SCREEN W PCR REFLEX  LIPASE, BLOOD   ____________________________________________  EKG  ED ECG REPORT I, [place] N BROWN, the attending physician, personally viewed and interpreted this ECG.   Date: 08/03/2019  EKG Time: 3:18 AM  Rate: 78  Rhythm: Normal sinus rhythm  Axis: Normal  Intervals: Normal  ST&T Change: None  ____________________________________________  RADIOLOGY I, [place] N BROWN, personally viewed and evaluated these images (plain radiographs) as part of my  medical decision making, as well as reviewing the written report by the radiologist.  ED MD interpretation: [facility],  Official radiology report(s): Ct Abdomen Pelvis Wo Contrast  Result Date: 08/03/2019 CLINICAL DATA:  Abdominal pain with diverticulitis suspected. Constipated yesterday with 3 bowel movements today. EXAM: CT ABDOMEN AND PELVIS WITHOUT CONTRAST TECHNIQUE: Multidetector CT imaging of the abdomen and pelvis was performed following the standard protocol without IV contrast. COMPARISON:  01/30/2017  FINDINGS: Lower chest:  Mild atelectasis in the subpleural lungs. Hepatobiliary: No focal liver abnormality.No evidence of biliary obstruction or stone. Pancreas: Unremarkable. Spleen: Unremarkable. Adrenals/Urinary Tract: Negative adrenals. Lobulated renal cortex with small volume left kidney, consistent with scarring. There are scattered parenchymal calcifications associated with the areas of scarring. No hydronephrosis or ureteral stone. Unremarkable bladder. Stomach/Bowel: The colon diffusely contains stool and/or fluid levels with mild wall thickening and pericolonic fat haziness. Mild distal colonic diverticulosis. Vascular/Lymphatic: Atherosclerotic calcification. No mass or adenopathy. Reproductive:No acute finding Other: No ascites or pneumoperitoneum. Musculoskeletal: No acute abnormalities. IMPRESSION: 1. Colitis with generalized colonic distention by stool and fluid levels. 2. Left more than right renal scarring and parenchymal calcification. Electronically Signed   By: Monte Fantasia M.D.   On: 08/03/2019 05:36     Procedures   ____________________________________________   INITIAL IMPRESSION / MDM / ASSESSMENT AND PLAN / ED COURSE  As part of my medical decision making, I reviewed the following data within the [place] NUMBER  73 year old female presented with above-stated history and physical exam concerning for possible colitis, diverticulitis with less likely  appendicitis.  Stool cultures revealed enteropathogenic E. coli CT scan consistent with colitis.  Patient received 2 L IV normal saline.  Patient has had 7 large diarrheal bowel movements while in the emergency department.  Patient was also given Imodium.  Given profuse diarrhea generalized weakness and patient's chronic medical conditions patient discussed with Dr. Marcille Blanco for hospital admission for further evaluation and management.  ____________________________________________  FINAL CLINICAL IMPRESSION(S) / ED DIAGNOSES  Final diagnoses:  Intestinal infection due to enteropathogenic E. coli     MEDICATIONS GIVEN DURING THIS VISIT:  Medications  morphine 2 MG/ML injection 2 mg (2 mg Intravenous Given 08/03/19 0330)  ondansetron (ZOFRAN) injection 4 mg (4 mg Intravenous Given 08/03/19 0331)  sodium chloride 0.9 % bolus 1,000 mL (0 mLs Intravenous Stopped 08/03/19 0500)  sodium chloride 0.9 % bolus 1,000 mL (0 mLs Intravenous Stopped 08/03/19 0619)  loperamide (IMODIUM) capsule 4 mg (4 mg Oral Given 08/03/19 0605)  azithromycin (ZITHROMAX) tablet 500 mg (500 mg Oral Given 08/03/19 0605)  dicyclomine (BENTYL) capsule 10 mg (10 mg Oral Given 08/03/19 0608)  morphine 2 MG/ML injection 2 mg (2 mg Intravenous Given 08/03/19 9417)     ED Discharge Orders    None      *Please note:  Sherry Oneal was evaluated in Emergency Department on 08/03/2019 for the symptoms described in the history of present illness. She was evaluated in the context of the global COVID-19 pandemic, which necessitated consideration that the patient might be at risk for infection with the SARS-CoV-2 virus that causes COVID-19. Institutional protocols and algorithms that pertain to the evaluation of patients at risk for COVID-19 are in a state of rapid change based on information released by regulatory bodies including the CDC and federal and state organizations. These policies and algorithms were followed during the patient's care in the  ED.  Some ED evaluations and interventions may be delayed as a result of limited staffing during the pandemic.*  Note:  This document was prepared using Dragon voice recognition software and may include unintentional dictation errors.   Gregor Hams, MD 08/03/19 [phone]

## 2019-08-03 NOTE — H&P (Signed)
[facility] NAME: Sherry Oneal    MR#:  [id number]  DATE OF BIRTH:  [birth year]  DATE OF ADMISSION:  08/03/2019  PRIMARY CARE PHYSICIAN: Lavera Guise, MD   REQUESTING/REFERRING PHYSICIAN: Owens Shark MD  CHIEF COMPLAINT:   Severe abdominal pain HISTORY OF PRESENT ILLNESS:  Sherry Oneal  is a 73 y.o. female with a known medical problems hypertension hyperlipidemia multiple myeloma on chemotherapy and chronic renal insufficiency is presenting to the ED with a chief complaint of acute onset of severe lower abdominal pain 10 out of 10 cramping in nature but no nausea or vomiting tolerating p.o. and reports some diarrhea.  Denies any blood in her stool.  Stool culture revealed enteropathogenic E. coli and CT scan consistent with colitis.  Hospitalist team is called admit the patient.  Denies any fever.  Denies any exposure to covered patients  PAST MEDICAL HISTORY:   Past Medical History:  Diagnosis Date  . Anemia   . Arthritis    lower back  . High blood pressure   . High cholesterol   . Multiple myeloma ([place])   . Renal insufficiency   . Wears dentures    partial upper    PAST SURGICAL HISTOIRY:   Past Surgical History:  Procedure Laterality Date  . CATARACT EXTRACTION W/PHACO Right 08/17/2018   Procedure: CATARACT EXTRACTION PHACO AND INTRAOCULAR LENS PLACEMENT ([facility])  RIGHT;  Surgeon: Leandrew Koyanagi, MD;  Location: [facility];  Service: Ophthalmology;  Laterality: Right;  . CATARACT EXTRACTION W/PHACO Left 11/09/2018   Procedure: CATARACT EXTRACTION PHACO AND INTRAOCULAR LENS PLACEMENT ([place]) LEFT;  Surgeon: Leandrew Koyanagi, MD;  Location: [facility];  Service: Ophthalmology;  Laterality: Left;  . EXPLORATORY LAPAROTOMY      SOCIAL HISTORY:   Social History   Tobacco Use  . Smoking status: Never Smoker  . Smokeless tobacco: Never Used  Substance Use Topics  . Alcohol use: No    FAMILY  HISTORY:   Family History  Problem Relation Age of Onset  . Breast cancer Maternal Grandmother 71  . CAD Mother   . CVA Mother     DRUG ALLERGIES:   Allergies  Allergen Reactions  . Aspirin Other (See Comments)    Causes internal bleeding per patient  . Atenolol     "wierd feeling"  . Doxycycline     Upset stomach   . Epinephrine     "Shaking"  . Ivp Dye [Iodinated Diagnostic Agents] Hypertension    Per tech conversation with PT, pt states that the last time she had IV dye she had increased BP and tachycardia.     Marland Kitchen Ketorolac   . Nsaids     Bleeding  . Penicillins     rash  . Sulfa Antibiotics     Rash   . Tolmetin     Bleeding  . Tramadol     Numb feeling   . Procaine Palpitations    States this was added due to palpitations and shakes after receiving local anesthetic containing epinephrine.     REVIEW OF SYSTEMS:  CONSTITUTIONAL: No fever, fatigue or weakness.  EYES: No blurred or double vision.  EARS, NOSE, AND THROAT: No tinnitus or ear pain.  RESPIRATORY: No cough, shortness of breath, wheezing or hemoptysis.  CARDIOVASCULAR: No chest pain, orthopnea, edema.  GASTROINTESTINAL: No nausea, vomiting, reporting diarrhea and severe lower abdominal pain.  GENITOURINARY: No dysuria, hematuria.  ENDOCRINE: No polyuria, nocturia,  HEMATOLOGY:  No anemia, easy bruising or bleeding SKIN: No rash or lesion. MUSCULOSKELETAL: No joint pain or arthritis.   NEUROLOGIC: No tingling, numbness, weakness.  PSYCHIATRY: No anxiety or depression.   MEDICATIONS AT HOME:   Prior to Admission medications   Medication Sig Start Date End Date Taking? Authorizing Provider  acetaminophen (TYLENOL) 500 MG tablet Take 500 mg by mouth every 4 (four) hours as needed.    Yes [provider]  albuterol (PROVENTIL HFA;VENTOLIN HFA) 108 (90 Base) MCG/ACT inhaler Inhale 2 puffs into the lungs every 6 (six) hours as needed for wheezing or shortness of breath. 03/24/18  Yes Sudini,  Alveta Heimlich, MD  ALPRAZolam Duanne Moron) 0.25 MG tablet Take 1 tablet (0.25 mg total) by mouth at bedtime as needed. 11/17/18  Yes Boscia, Greer Ee, NP  atorvastatin (LIPITOR) 10 MG tablet Take 1 tablet (10 mg total) by mouth daily. 07/04/19  Yes Ronnell Freshwater, NP  Calcium Carbonate-Vitamin D 600-400 MG-UNIT tablet Take 1 tablet by mouth daily.   Yes [provider]  cholecalciferol (DELTA D3) 10 MCG (400 UNIT) TABS tablet Take 400 Units by mouth daily.   Yes [provider]  Cranberry 500 MG CAPS Take 500 mg by mouth every morning.   Yes [provider]  DARATUMUMAB IV Inject into the vein every 30 (thirty) days.   Yes [provider]  dexamethasone (DECADRON) 6 MG tablet Take 12 mg by mouth every 30 (thirty) days.    Yes [provider]  diphenhydrAMINE (BENADRYL ALLERGY) 25 MG tablet Take 25 mg by mouth daily as needed.   Yes [provider]  docusate (COLACE) 50 MG/5ML liquid Take by mouth daily as needed for mild constipation.   Yes [provider]  ELIQUIS 5 MG TABS tablet Take 1 tablet by mouth 2 (two) times daily. 02/21/18  Yes [provider]  fluticasone (FLONASE) 50 MCG/ACT nasal spray Place into both nostrils daily.   Yes [provider]  guaiFENesin-codeine (VIRTUSSIN A/C) 100-10 MG/5ML syrup Take 5 mLs by mouth every 6 (six) hours as needed for cough.   Yes [provider]  ipratropium-albuterol (DUONEB) 0.5-2.5 (3) MG/3ML SOLN Take 3 mLs by nebulization every 6 (six) hours as needed. 03/14/19  Yes Scarboro, Audie Clear, NP  levofloxacin (LEVAQUIN) 500 MG tablet Take 1 tablet (500 mg total) by mouth daily. 03/14/19  Yes Scarboro, Audie Clear, NP  lisinopril (ZESTRIL) 10 MG tablet Take 1 tablet (10 mg total) by mouth daily. 05/10/19  Yes Boscia, Greer Ee, NP  Multiple Vitamins-Minerals (PRESERVISION AREDS PO) Take 1 tablet by mouth every morning.   Yes [provider]  omeprazole (PRILOSEC) 20 MG capsule Take  20 mg by mouth daily. 01/20/17  Yes [provider]  ondansetron (ZOFRAN-ODT) 4 MG disintegrating tablet DISSOLVE 1 TABLET IN MOUTH EVERY 8 HOURS AS NEEDED FOR NAUSEA 12/20/17  Yes [provider]  polyethylene glycol (MIRALAX / GLYCOLAX) packet Take 17 g by mouth daily as needed.   Yes [provider]  pomalidomide (POMALYST) 4 MG capsule Take 1 capsule by mouth once daily for days 1-21. 7 days rest. 02/16/18  Yes [provider]  prochlorperazine (COMPAZINE) 5 MG tablet Take 5 mg by mouth every 8 (eight) hours as needed. 12/04/16  Yes [provider]  senna (SENOKOT) 8.6 MG tablet Take 1 tablet by mouth daily as needed for constipation.   Yes [provider]  valACYclovir (VALTREX) 500 MG tablet Take 500 mg by mouth daily. 01/20/17  Yes [provider]      VITAL SIGNS:  Blood pressure (!) 106/45, pulse 69, temperature 97.8 F (36.6 C), temperature source Oral, resp. rate (!) 23, height 5' 4"  (1.626 m), weight 62.1 kg, SpO2 98 %.  PHYSICAL EXAMINATION:  GENERAL:  73 y.o.-year-old patient lying in the bed with no acute distress.  EYES: Pupils equal, round, reactive to light and accommodation. No scleral icterus. Extraocular muscles intact.  HEENT: Head atraumatic, normocephalic. Oropharynx and nasopharynx clear.  NECK:  Supple, no jugular venous distention. No thyroid enlargement, no tenderness.  LUNGS: Normal breath sounds bilaterally, no wheezing, rales,rhonchi or crepitation. No use of accessory muscles of respiration.  CARDIOVASCULAR: S1, S2 normal. No murmurs, rubs, or gallops.  ABDOMEN: Soft, lower abdomen is tender some distention positive bowel sounds EXTREMITIES: No pedal edema, cyanosis, or clubbing.  NEUROLOGIC: Cranial nerves II through XII are intact. Muscle strength 5/5 in all extremities. Sensation intact. Gait not checked.  PSYCHIATRIC: The patient is alert and oriented x 3.  SKIN: No obvious rash, lesion, or ulcer.    LABORATORY PANEL:   CBC Recent Labs  Lab 08/03/19 0329  WBC 14.5*  HGB 13.1  HCT 38.7  PLT 130*   ------------------------------------------------------------------------------------------------------------------  Chemistries  Recent Labs  Lab 08/03/19 0352  NA 139  K 3.3*  CL 111  CO2 20*  GLUCOSE 141*  BUN 22  CREATININE 1.42*  CALCIUM 9.0  AST 23  ALT 20  ALKPHOS 61  BILITOT 0.7   ------------------------------------------------------------------------------------------------------------------  Cardiac Enzymes No results for input(s): TROPONINI in the last 168 hours. ------------------------------------------------------------------------------------------------------------------  RADIOLOGY:  Ct Abdomen Pelvis Wo Contrast  Result Date: 08/03/2019 CLINICAL DATA:  Abdominal pain with diverticulitis suspected. Constipated yesterday with 3 bowel movements today. EXAM: CT ABDOMEN AND PELVIS WITHOUT CONTRAST TECHNIQUE: Multidetector CT imaging of the abdomen and pelvis was performed following the standard protocol without IV contrast. COMPARISON:  01/30/2017 FINDINGS: Lower chest:  Mild atelectasis in the subpleural lungs. Hepatobiliary: No focal liver abnormality.No evidence of biliary obstruction or stone. Pancreas: Unremarkable. Spleen: Unremarkable. Adrenals/Urinary Tract: Negative adrenals. Lobulated renal cortex with small volume left kidney, consistent with scarring. There are scattered parenchymal calcifications associated with the areas of scarring. No hydronephrosis or ureteral stone. Unremarkable bladder. Stomach/Bowel: The colon diffusely contains stool and/or fluid levels with mild wall thickening and pericolonic fat haziness. Mild distal colonic diverticulosis. Vascular/Lymphatic: Atherosclerotic calcification. No mass or adenopathy. Reproductive:No acute finding Other: No ascites or pneumoperitoneum. Musculoskeletal: No acute abnormalities. IMPRESSION: 1. Colitis  with generalized colonic distention by stool and fluid levels. 2. Left more than right renal scarring and parenchymal calcification. Electronically Signed   By: Monte Fantasia M.D.   On: 08/03/2019 05:36    EKG:   Orders placed or performed during the hospital encounter of 08/03/19  . EKG 12-Lead  . EKG 12-Lead    IMPRESSION AND PLAN:     #Acute abdominal pain secondary to enteropathogenic E. coli colitis Admit to MedSurg unit Patient started her on azithromycin IV fluids pain management and supportive treatment if no improvement GI consult will be considered Bentyl for cramps  #Essential hypertension holding home medication lisinopril in view of soft blood pressure   #Multiple myeloma continue her treatments with Pomalyst.  Continue home medicine valacyclovir for prophylaxis  #Hyperlipidemia continue home medication Lipitor  #Anxiety Xanax  DVT prophylaxis patient is on Eliquis All the records are reviewed and case discussed with ED provider. Management plans discussed with the patient, she is in agreement.  CODE  STATUS: fc, husband is a healthcare power of attorney  TOTAL TIME TAKING CARE OF THIS PATIENT: 57mnutes.   Note: This dictation was prepared with Dragon dictation along with smaller phrase technology. Any transcriptional errors that result from this process are unintentional.  ANicholes MangoM.D on 08/03/2019 at 4:10 PM  Between 7am to 6pm - Pager - [phone] After 6pm go to [email or web address] - password [email or web address] [facility]ows[facility]ospitalists  Office  [phone] CC: Primary care physician; KLavera Guise MD

## 2019-08-04 DIAGNOSIS — I10 Essential (primary) hypertension: Secondary | ICD-10-CM | POA: Diagnosis not present

## 2019-08-04 DIAGNOSIS — A040 Enteropathogenic Escherichia coli infection: Secondary | ICD-10-CM | POA: Diagnosis not present

## 2019-08-04 DIAGNOSIS — C9000 Multiple myeloma not having achieved remission: Secondary | ICD-10-CM | POA: Diagnosis not present

## 2019-08-04 DIAGNOSIS — K529 Noninfective gastroenteritis and colitis, unspecified: Secondary | ICD-10-CM | POA: Diagnosis not present

## 2019-08-04 LAB — CBC
HCT: 31 % — ABNORMAL LOW (ref 36.0–46.0)
Hemoglobin: 10.3 g/dL — ABNORMAL LOW (ref 12.0–15.0)
MCH: 32.7 pg (ref 26.0–34.0)
MCHC: 33.2 g/dL (ref 30.0–36.0)
MCV: 98.4 fL (ref 80.0–100.0)
Platelets: 113 10*3/uL — ABNORMAL LOW (ref 150–400)
RBC: 3.15 MIL/uL — ABNORMAL LOW (ref 3.87–5.11)
RDW: 14.6 % (ref 11.5–15.5)
WBC: 5.9 10*3/uL (ref 4.0–10.5)
nRBC: 0 % (ref 0.0–0.2)

## 2019-08-04 LAB — COMPREHENSIVE METABOLIC PANEL
ALT: 16 U/L (ref 0–44)
AST: 19 U/L (ref 15–41)
Albumin: 2.4 g/dL — ABNORMAL LOW (ref 3.5–5.0)
Alkaline Phosphatase: 39 U/L (ref 38–126)
Anion gap: 6 (ref 5–15)
BUN: 13 mg/dL (ref 8–23)
CO2: 20 mmol/L — ABNORMAL LOW (ref 22–32)
Calcium: 7.4 mg/dL — ABNORMAL LOW (ref 8.9–10.3)
Chloride: 112 mmol/L — ABNORMAL HIGH (ref 98–111)
Creatinine, Ser: 1.04 mg/dL — ABNORMAL HIGH (ref 0.44–1.00)
GFR calc Af Amer: >60 mL/min (ref >60)
GFR calc non Af Amer: 53 mL/min — ABNORMAL LOW (ref >60)
Glucose, Bld: 93 mg/dL (ref 70–99)
Potassium: 4 mmol/L (ref 3.5–5.1)
Sodium: 138 mmol/L (ref 135–145)
Total Bilirubin: 1 mg/dL (ref 0.3–1.2)
Total Protein: 4.3 g/dL — ABNORMAL LOW (ref 6.5–8.1)

## 2019-08-04 MED ORDER — AZITHROMYCIN 500 MG PO TABS: 500.0000 mg | ORAL_TABLET | Freq: Every day | ORAL | 0 refills | Status: DC

## 2019-08-04 MED ORDER — DICYCLOMINE HCL 10 MG PO CAPS: 10.0000 mg | ORAL_CAPSULE | Freq: Three times a day (TID) | ORAL | 0 refills | Status: DC | PRN

## 2019-08-04 NOTE — Progress Notes (Signed)
Pt has two bright red bowel movements during shift. Pt hgb dropped from 13.1 to 10.3 since admission. Pt is currently on IV fluids. Pt report she feels stronger than when she was first admitted. Will continue to monitor.

## 2019-08-04 NOTE — Care Management Obs Status (Signed)
[facility] NOTIFICATION   Patient Details  Name: LUCRESHA DISMUKE MRN: [id number] Date of Birth: 07-02-1946   Medicare Observation Status Notification Given:  Yes    Jermiya Reichl A Rosealynn Mateus, RN 08/04/2019, 12:08 PM

## 2019-08-04 NOTE — Discharge Instructions (Signed)
F/u with pcp in 3 days

## 2019-08-04 NOTE — Discharge Summary (Signed)
[place] [facility] [place] NAME: Sherry Oneal    MR#:  [id number]  DATE OF BIRTH:  1946/10/22  DATE OF ADMISSION:  08/03/2019 ADMITTING PHYSICIAN: Nicholes Mango, MD  DATE OF DISCHARGE: 08/04/2019  1:42 PM  PRIMARY CARE PHYSICIAN: Lavera Guise, MD    ADMISSION DIAGNOSIS:  Intestinal infection due to enteropathogenic E. coli [A04.0]  DISCHARGE DIAGNOSIS:  Active Problems:   Acute colitis Enteropathogenic E. coli  SECONDARY DIAGNOSIS:   Past Medical History:  Diagnosis Date  . Anemia   . Arthritis    lower back  . High blood pressure   . High cholesterol   . Multiple myeloma ([place])   . Renal insufficiency   . Wears dentures    partial upper    HOSPITAL COURSE:   Sherry Oneal  is a 73 y.o. female with a known medical problems hypertension hyperlipidemia multiple myeloma on chemotherapy and chronic renal insufficiency is presenting to the ED with a chief complaint of acute onset of severe lower abdominal pain 10 out of 10 cramping in nature but no nausea or vomiting tolerating p.o. and reports some diarrhea.  Denies any blood in her stool.  Stool culture revealed enteropathogenic E. coli and CT scan consistent with colitis.  Hospitalist team is called admit the patient.  Denies any fever.  Denies any exposure to covered patients   #Acute abdominal pain secondary to enteropathogenic E. coli colitis Patient started her on azithromycin, clinically improved IV fluids pain management and supportive treatment provided  Bentyl for cramps given Diarrhea clinically improved and patient is started feeling much better.  She noticed small amount of blood in the stool but hemodynamically stable and wants to go home.  Will discharge patient with the Bentyl and azithromycin  #Essential hypertension holding home medication lisinopril in view of soft blood pressure   #Multiple myeloma continue her treatments with Pomalyst.  Continue home medicine  valacyclovir for prophylaxis  #Hyperlipidemia continue home medication Lipitor  #Anxiety Xanax  DVT prophylaxis patient is on Eliquis DISCHARGE CONDITIONS:   Stable   CONSULTS OBTAINED:     PROCEDURES  None   DRUG ALLERGIES:   Allergies  Allergen Reactions  . Aspirin Other (See Comments)    Causes internal bleeding per patient  . Atenolol     "wierd feeling"  . Doxycycline     Upset stomach   . Epinephrine     "Shaking"  . Ivp Dye [Iodinated Diagnostic Agents] Hypertension    Per tech conversation with PT, pt states that the last time she had IV dye she had increased BP and tachycardia.     Marland Kitchen Ketorolac   . Nsaids     Bleeding  . Penicillins     rash  . Sulfa Antibiotics     Rash   . Tolmetin     Bleeding  . Tramadol     Numb feeling   . Procaine Palpitations    States this was added due to palpitations and shakes after receiving local anesthetic containing epinephrine.     DISCHARGE MEDICATIONS:   Allergies as of 08/04/2019      Reactions   Aspirin Other (See Comments)   Causes internal bleeding per patient   Atenolol    "wierd feeling"   Doxycycline    Upset stomach   Epinephrine    "Shaking"   Ivp Dye [iodinated Diagnostic Agents] Hypertension   Per tech conversation with PT, pt states that the last time  she had IV dye she had increased BP and tachycardia.      Ketorolac    Nsaids    Bleeding   Penicillins    rash   Sulfa Antibiotics    Rash   Tolmetin    Bleeding   Tramadol    Numb feeling   Procaine Palpitations   States this was added due to palpitations and shakes after receiving local anesthetic containing epinephrine.      Medication List    STOP taking these medications   levofloxacin 500 MG tablet Commonly known as: LEVAQUIN   lisinopril 10 MG tablet Commonly known as: ZESTRIL     TAKE these medications   acetaminophen 500 MG tablet Commonly known as: TYLENOL Take 500 mg by mouth every 4 (four) hours as needed.    albuterol 108 (90 Base) MCG/ACT inhaler Commonly known as: VENTOLIN HFA Inhale 2 puffs into the lungs every 6 (six) hours as needed for wheezing or shortness of breath.   ALPRAZolam 0.25 MG tablet Commonly known as: XANAX Take 1 tablet (0.25 mg total) by mouth at bedtime as needed.   atorvastatin 10 MG tablet Commonly known as: LIPITOR Take 1 tablet (10 mg total) by mouth daily.   azithromycin 500 MG tablet Commonly known as: Zithromax Take 1 tablet (500 mg total) by mouth daily for 3 days. Take 1 tablet daily for 3 days.   Benadryl Allergy 25 MG tablet Generic drug: diphenhydrAMINE Take 25 mg by mouth daily as needed.   Calcium Carbonate-Vitamin D 600-400 MG-UNIT tablet Take 1 tablet by mouth daily.   Cranberry 500 MG Caps Take 500 mg by mouth every morning.   DARATUMUMAB IV Inject into the vein every 30 (thirty) days.   Delta D3 10 MCG (400 UNIT) Tabs tablet Generic drug: cholecalciferol Take 400 Units by mouth daily.   dexamethasone 6 MG tablet Commonly known as: DECADRON Take 12 mg by mouth every 30 (thirty) days.   dicyclomine 10 MG capsule Commonly known as: BENTYL Take 1 capsule (10 mg total) by mouth 3 (three) times daily with meals as needed for spasms.   docusate 50 MG/5ML liquid Commonly known as: COLACE Take by mouth daily as needed for mild constipation.   Eliquis 5 MG Tabs tablet Generic drug: apixaban Take 1 tablet by mouth 2 (two) times daily.   fluticasone 50 MCG/ACT nasal spray Commonly known as: FLONASE Place into both nostrils daily.   ipratropium-albuterol 0.5-2.5 (3) MG/3ML Soln Commonly known as: DUONEB Take 3 mLs by nebulization every 6 (six) hours as needed.   omeprazole 20 MG capsule Commonly known as: PRILOSEC Take 20 mg by mouth daily.   ondansetron 4 MG disintegrating tablet Commonly known as: ZOFRAN-ODT DISSOLVE 1 TABLET IN MOUTH EVERY 8 HOURS AS NEEDED FOR NAUSEA   polyethylene glycol 17 g packet Commonly known as:  MIRALAX / GLYCOLAX Take 17 g by mouth daily as needed.   pomalidomide 4 MG capsule Commonly known as: POMALYST Take 1 capsule by mouth once daily for days 1-21. 7 days rest.   PRESERVISION AREDS PO Take 1 tablet by mouth every morning.   prochlorperazine 5 MG tablet Commonly known as: COMPAZINE Take 5 mg by mouth every 8 (eight) hours as needed.   senna 8.6 MG tablet Commonly known as: SENOKOT Take 1 tablet by mouth daily as needed for constipation.   valACYclovir 500 MG tablet Commonly known as: VALTREX Take 500 mg by mouth daily.   Virtussin A/C 100-10 MG/5ML syrup Generic drug:  guaiFENesin-codeine Take 5 mLs by mouth every 6 (six) hours as needed for cough.        DISCHARGE INSTRUCTIONS:  F/u with pcp in 3 days   DIET:  Cardiac diet  DISCHARGE CONDITION:  Stable  ACTIVITY:  Activity as tolerated  OXYGEN:  Home Oxygen: No.   Oxygen Delivery: room air  DISCHARGE LOCATION:  home   If you experience worsening of your admission symptoms, develop shortness of breath, life threatening emergency, suicidal or homicidal thoughts you must seek medical attention immediately by calling 911 or calling your MD immediately  if symptoms less severe.  You Must read complete instructions/literature along with all the possible adverse reactions/side effects for all the Medicines you take and that have been prescribed to you. Take any new Medicines after you have completely understood and accpet all the possible adverse reactions/side effects.   Please note  You were cared for by a hospitalist during your hospital stay. If you have any questions about your discharge medications or the care you received while you were in the hospital after you are discharged, you can call the unit and asked to speak with the hospitalist on call if the hospitalist that took care of you is not available. Once you are discharged, your primary care physician will handle any further medical issues.  Please note that NO REFILLS for any discharge medications will be authorized once you are discharged, as it is imperative that you return to your primary care physician (or establish a relationship with a primary care physician if you do not have one) for your aftercare needs so that they can reassess your need for medications and monitor your lab values.     Today  Chief Complaint  Patient presents with  . Abdominal Pain   Patient is feeling fine denies any nausea vomiting tolerating food and abdominal pain significantly improved diarrhea is improving C. difficile toxin negative wants to go home   ROS:  CONSTITUTIONAL: Denies fevers, chills. Denies any fatigue, weakness.  EYES: Denies blurry vision, double vision, eye pain. EARS, NOSE, THROAT: Denies tinnitus, ear pain, hearing loss. RESPIRATORY: Denies cough, wheeze, shortness of breath.  CARDIOVASCULAR: Denies chest pain, palpitations, edema.  GASTROINTESTINAL: Denies nausea, vomiting, endorsing improving diarrhea, abdominal pain.  Reported tinge of bright red blood in the stool. GENITOURINARY: Denies dysuria, hematuria. ENDOCRINE: Denies nocturia or thyroid problems. HEMATOLOGIC AND LYMPHATIC: Denies easy bruising or bleeding. SKIN: Denies rash or lesion. MUSCULOSKELETAL: Denies pain in neck, back, shoulder, knees, hips or arthritic symptoms.  NEUROLOGIC: Denies paralysis, paresthesias.  PSYCHIATRIC: Denies anxiety or depressive symptoms.   VITAL SIGNS:  Blood pressure (!) 106/44, pulse 71, temperature 98.2 F (36.8 C), temperature source Oral, resp. rate 16, height 5' 4"  (1.626 m), weight 59.6 kg, SpO2 94 %.  I/O:    Intake/Output Summary (Last 24 hours) at 08/04/2019 1611 Last data filed at 08/04/2019 1218 Gross per 24 hour  Intake 2115.5 ml  Output 704 ml  Net 1411.5 ml    PHYSICAL EXAMINATION:  GENERAL:  73 y.o.-year-old patient lying in the bed with no acute distress.  EYES: Pupils equal, round, reactive to light and  accommodation. No scleral icterus. Extraocular muscles intact.  HEENT: Head atraumatic, normocephalic. Oropharynx and nasopharynx clear.  NECK:  Supple, no jugular venous distention. No thyroid enlargement, no tenderness.  LUNGS: Normal breath sounds bilaterally, no wheezing, rales,rhonchi or crepitation. No use of accessory muscles of respiration.  CARDIOVASCULAR: S1, S2 normal. No murmurs, rubs, or gallops.  ABDOMEN:  Soft, non-tender, non-distended. Bowel sounds present.  EXTREMITIES: No pedal edema, cyanosis, or clubbing.  NEUROLOGIC: Cranial nerves II through XII are intact. Muscle strength 5/5 in all extremities. Sensation intact. Gait not checked.  PSYCHIATRIC: The patient is alert and oriented x 3.  SKIN: No obvious rash, lesion, or ulcer.   DATA REVIEW:   CBC Recent Labs  Lab 08/04/19 0517  WBC 5.9  HGB 10.3*  HCT 31.0*  PLT 113*    Chemistries  Recent Labs  Lab 08/04/19 0517  NA 138  K 4.0  CL 112*  CO2 20*  GLUCOSE 93  BUN 13  CREATININE 1.04*  CALCIUM 7.4*  AST 19  ALT 16  ALKPHOS 39  BILITOT 1.0    Cardiac Enzymes No results for input(s): TROPONINI in the last 168 hours.  Microbiology Results  Results for orders placed or performed during the hospital encounter of 08/03/19  Gastrointestinal Panel by PCR , Stool     Status: Abnormal   Collection Time: 08/03/19  3:52 AM   Specimen: Stool  Result Value Ref Range Status   Campylobacter species NOT DETECTED NOT DETECTED Final   Plesimonas shigelloides NOT DETECTED NOT DETECTED Final   Salmonella species NOT DETECTED NOT DETECTED Final   Yersinia enterocolitica NOT DETECTED NOT DETECTED Final   Vibrio species NOT DETECTED NOT DETECTED Final   Vibrio cholerae NOT DETECTED NOT DETECTED Final   Enteroaggregative E coli (EAEC) NOT DETECTED NOT DETECTED Final   Enteropathogenic E coli (EPEC) DETECTED (A) NOT DETECTED Final    Comment: RESULT CALLED TO, READ BACK BY AND VERIFIED WITH: Lorretta Harp RN [phone]  08/03/2019 HNM    Enterotoxigenic E coli (ETEC) NOT DETECTED NOT DETECTED Final   Shiga like toxin producing E coli (STEC) NOT DETECTED NOT DETECTED Final   Shigella/Enteroinvasive E coli (EIEC) NOT DETECTED NOT DETECTED Final   Cryptosporidium NOT DETECTED NOT DETECTED Final   Cyclospora cayetanensis NOT DETECTED NOT DETECTED Final   Entamoeba histolytica NOT DETECTED NOT DETECTED Final   Giardia lamblia NOT DETECTED NOT DETECTED Final   Adenovirus F40/41 NOT DETECTED NOT DETECTED Final   Astrovirus NOT DETECTED NOT DETECTED Final   Norovirus GI/GII NOT DETECTED NOT DETECTED Final   Rotavirus A NOT DETECTED NOT DETECTED Final   Sapovirus (I, II, IV, and V) NOT DETECTED NOT DETECTED Final    Comment: Performed at [facility], [address] [place], [place] [address]  C difficile quick scan w PCR reflex     Status: None   Collection Time: 08/03/19  3:52 AM   Specimen: Stool  Result Value Ref Range Status   C Diff antigen NEGATIVE NEGATIVE Final   C Diff toxin NEGATIVE NEGATIVE Final   C Diff interpretation No C. difficile detected.  Final    Comment: Performed at [facility], [place], [place] [address]  SARS CORONAVIRUS 2 Nasal Swab Aptima Multi Swab     Status: None   Collection Time: 08/03/19  8:42 AM   Specimen: Aptima Multi Swab; Nasal Swab  Result Value Ref Range Status   SARS Coronavirus 2 NEGATIVE NEGATIVE Final    Comment: (NOTE) SARS-CoV-2 target nucleic acids are NOT DETECTED. The SARS-CoV-2 RNA is generally detectable in upper and lower respiratory specimens during the acute phase of infection. Negative results do not preclude SARS-CoV-2 infection, do not rule out co-infections with other pathogens, and should not be used as the sole basis for treatment or other patient management decisions. Negative  results must be combined with clinical observations, patient history, and epidemiological information. The expected result is  Negative. Fact Sheet for Patients: SugarRoll.be Fact Sheet for Healthcare Providers: https://www.woods-mathews.com/ This test is not yet approved or cleared by the [place] FDA and  has been authorized for detection and/or diagnosis of SARS-CoV-2 by FDA under an Emergency Use Authorization (EUA). This EUA will remain  in effect (meaning this test can be used) for the duration of the COVID-19 declaration under Section 56 4(b)(1) of the Act, 21 U.S.C. section 360bbb-3(b)(1), unless the authorization is terminated or revoked sooner. Performed at [facility], [place] [address], [place], [place] [address]     RADIOLOGY:  Ct Abdomen Pelvis Wo Contrast  Result Date: 08/03/2019 CLINICAL DATA:  Abdominal pain with diverticulitis suspected. Constipated yesterday with 3 bowel movements today. EXAM: CT ABDOMEN AND PELVIS WITHOUT CONTRAST TECHNIQUE: Multidetector CT imaging of the abdomen and pelvis was performed following the standard protocol without IV contrast. COMPARISON:  01/30/2017 FINDINGS: Lower chest:  Mild atelectasis in the subpleural lungs. Hepatobiliary: No focal liver abnormality.No evidence of biliary obstruction or stone. Pancreas: Unremarkable. Spleen: Unremarkable. Adrenals/Urinary Tract: Negative adrenals. Lobulated renal cortex with small volume left kidney, consistent with scarring. There are scattered parenchymal calcifications associated with the areas of scarring. No hydronephrosis or ureteral stone. Unremarkable bladder. Stomach/Bowel: The colon diffusely contains stool and/or fluid levels with mild wall thickening and pericolonic fat haziness. Mild distal colonic diverticulosis. Vascular/Lymphatic: Atherosclerotic calcification. No mass or adenopathy. Reproductive:No acute finding Other: No ascites or pneumoperitoneum. Musculoskeletal: No acute abnormalities. IMPRESSION: 1. Colitis with generalized colonic distention by stool and  fluid levels. 2. Left more than right renal scarring and parenchymal calcification. Electronically Signed   By: Monte Fantasia M.D.   On: 08/03/2019 05:36    EKG:   Orders placed or performed during the hospital encounter of 08/03/19  . EKG 12-Lead  . EKG 12-Lead      Management plans discussed with the patient, family and they are in agreement.  CODE STATUS:     Code Status Orders  (From admission, onward)         Start     Ordered   08/03/19 1616  Full code  Continuous     08/03/19 1615        Code Status History    Date Active Date Inactive Code Status Order ID Comments User Context   03/22/2018 2105 03/24/2018 1806 Full Code [id number]  Gladstone Lighter, MD Inpatient   01/30/2017 1517 02/01/2017 1747 Full Code [id number]  Idelle Crouch, MD Inpatient   Advance Care Planning Activity    Advance Directive Documentation     Most Recent Value  Type of Advance Directive  Healthcare Power of [place], Living will  Pre-existing out of facility DNR order (yellow form or pink MOST form)  -  "MOST" Form in Place?  -      TOTAL TIME TAKING CARE OF THIS PATIENT: 43 minutes.   Note: This dictation was prepared with Dragon dictation along with smaller phrase technology. Any transcriptional errors that result from this process are unintentional.   @[facility] @  on 08/04/2019 at 4:11 PM  Between 7am to 6pm - Pager - [phone]  After 6pm go to [email or web address] - password [email or web address] [facility]  [place] [facility]  Office  [phone]  CC: Primary care physician; Lavera Guise, MD

## 2019-08-04 NOTE — Progress Notes (Signed)
Patient discharged home per MD order. All discharge instructions given and all questions answered. 

## 2019-08-04 NOTE — Care Management CC44 (Signed)
Condition Code 44 Documentation Completed  Patient Details  Name: Sherry Oneal MRN: [id number] Date of Birth: 11-19-1946   Condition Code 44 given:  Yes Patient signature on Condition Code 44 notice:  Yes Documentation of 2 MD's agreement:  Yes Code 44 added to claim:  Yes    Latanya Maudlin, RN 08/04/2019, 12:08 PM

## 2019-08-07 ENCOUNTER — Encounter: Payer: Self-pay | Admitting: Adult Health

## 2019-08-07 ENCOUNTER — Other Ambulatory Visit: Payer: Self-pay

## 2019-08-07 ENCOUNTER — Ambulatory Visit (INDEPENDENT_AMBULATORY_CARE_PROVIDER_SITE_OTHER): Payer: Medicare HMO | Admitting: Adult Health

## 2019-08-07 VITALS — BP 132/60 | HR 67 | Temp 96.6°F | Resp 16 | Ht 64.5 in | Wt 137.0 lb

## 2019-08-07 DIAGNOSIS — C9000 Multiple myeloma not having achieved remission: Secondary | ICD-10-CM | POA: Diagnosis not present

## 2019-08-07 DIAGNOSIS — I10 Essential (primary) hypertension: Secondary | ICD-10-CM | POA: Diagnosis not present

## 2019-08-07 DIAGNOSIS — K529 Noninfective gastroenteritis and colitis, unspecified: Secondary | ICD-10-CM | POA: Diagnosis not present

## 2019-08-07 NOTE — Progress Notes (Signed)
[facility] [place], [place] [address]  Internal MEDICINE  Office Visit Note  Patient Name: Sherry Oneal  [id number]  [id number]  Date of Service: 08/07/2019     Chief Complaint  Patient presents with  . Ulcerative Colitis    hospital follow up      HPI Pt is here for recent hospital follow up. Pt reports she went to the hospital early Thursday morning for severe stomach cramps.  Pt reports she had a syncopal episode at home.  Her husband called 65, and in the ER then diagnosed her with E.  Coli colitis.  She was put on IV antibiotics, and sent her home on azithromycin PO.  She took the last one today. She reports she is feeling much better, and denies having any diarrhea.   She is also on chemotherapy pills for 21 days at a time, for multiple myeloma.  She has been doing this for 2-3 years.  She is currently in remission.     Current Medication: Outpatient Encounter Medications as of 08/07/2019  Medication Sig  . acetaminophen (TYLENOL) 500 MG tablet Take 500 mg by mouth every 4 (four) hours as needed.   Marland Kitchen albuterol (PROVENTIL HFA;VENTOLIN HFA) 108 (90 Base) MCG/ACT inhaler Inhale 2 puffs into the lungs every 6 (six) hours as needed for wheezing or shortness of breath.  . ALPRAZolam (XANAX) 0.25 MG tablet Take 1 tablet (0.25 mg total) by mouth at bedtime as needed.  Marland Kitchen atorvastatin (LIPITOR) 10 MG tablet Take 1 tablet (10 mg total) by mouth daily.  . Calcium Carbonate-Vitamin D 600-400 MG-UNIT tablet Take 1 tablet by mouth daily.  . cholecalciferol (DELTA D3) 10 MCG (400 UNIT) TABS tablet Take 400 Units by mouth daily.  . Cranberry 500 MG CAPS Take 500 mg by mouth every morning.  Marland Kitchen DARATUMUMAB IV Inject into the vein every 30 (thirty) days.  Marland Kitchen dexamethasone (DECADRON) 6 MG tablet Take 12 mg by mouth every 30 (thirty) days.   Marland Kitchen dicyclomine (BENTYL) 10 MG capsule Take 1 capsule (10 mg total) by mouth 3 (three) times daily with meals as needed for spasms.   . diphenhydrAMINE (BENADRYL ALLERGY) 25 MG tablet Take 25 mg by mouth daily as needed.  . docusate (COLACE) 50 MG/5ML liquid Take by mouth daily as needed for mild constipation.  Marland Kitchen ELIQUIS 5 MG TABS tablet Take 1 tablet by mouth 2 (two) times daily.  . fluticasone (FLONASE) 50 MCG/ACT nasal spray Place into both nostrils daily.  Marland Kitchen guaiFENesin-codeine (VIRTUSSIN A/C) 100-10 MG/5ML syrup Take 5 mLs by mouth every 6 (six) hours as needed for cough.  Marland Kitchen ipratropium-albuterol (DUONEB) 0.5-2.5 (3) MG/3ML SOLN Take 3 mLs by nebulization every 6 (six) hours as needed.  . Multiple Vitamins-Minerals (PRESERVISION AREDS PO) Take 1 tablet by mouth every morning.  Marland Kitchen omeprazole (PRILOSEC) 20 MG capsule Take 20 mg by mouth daily.  . ondansetron (ZOFRAN-ODT) 4 MG disintegrating tablet DISSOLVE 1 TABLET IN MOUTH EVERY 8 HOURS AS NEEDED FOR NAUSEA  . polyethylene glycol (MIRALAX / GLYCOLAX) packet Take 17 g by mouth daily as needed.  . pomalidomide (POMALYST) 4 MG capsule Take 1 capsule by mouth once daily for days 1-21. 7 days rest.  . prochlorperazine (COMPAZINE) 5 MG tablet Take 5 mg by mouth every 8 (eight) hours as needed.  . senna (SENOKOT) 8.6 MG tablet Take 1 tablet by mouth daily as needed for constipation.  . valACYclovir (VALTREX) 500 MG tablet Take 500 mg by mouth  daily.  . [DISCONTINUED] azithromycin (ZITHROMAX) 500 MG tablet Take 1 tablet (500 mg total) by mouth daily for 3 days. Take 1 tablet daily for 3 days. (Patient not taking: Reported on 08/07/2019)   No facility-administered encounter medications on file as of 08/07/2019.     Surgical History: Past Surgical History:  Procedure Laterality Date  . CATARACT EXTRACTION W/PHACO Right 08/17/2018   Procedure: CATARACT EXTRACTION PHACO AND INTRAOCULAR LENS PLACEMENT ([place])  RIGHT;  Surgeon: Leandrew Koyanagi, MD;  Location: [place];  Service: Ophthalmology;  Laterality: Right;  . CATARACT EXTRACTION W/PHACO Left 11/09/2018    Procedure: CATARACT EXTRACTION PHACO AND INTRAOCULAR LENS PLACEMENT ([place]) LEFT;  Surgeon: Leandrew Koyanagi, MD;  Location: [place];  Service: Ophthalmology;  Laterality: Left;  . EXPLORATORY LAPAROTOMY      Medical History: Past Medical History:  Diagnosis Date  . Anemia   . Arthritis    lower back  . High blood pressure   . High cholesterol   . Multiple myeloma ([place])   . Renal insufficiency   . Wears dentures    partial upper    Family History: Family History  Problem Relation Age of Onset  . Breast cancer Maternal Grandmother 73  . CAD Mother   . CVA Mother     Social History   Socioeconomic History  . Marital status: Married    Spouse name: Not on file  . Number of children: 1  . Years of education: Not on file  . Highest education level: Not on file  Occupational History  . Not on file  Social Needs  . Financial resource strain: Not hard at all  . Food insecurity    Worry: Never true    Inability: Never true  . Transportation needs    Medical: No    Non-medical: No  Tobacco Use  . Smoking status: Never Smoker  . Smokeless tobacco: Never Used  Substance and Sexual Activity  . Alcohol use: No  . Drug use: No  . Sexual activity: Not Currently    Birth control/protection: None  Lifestyle  . Physical activity    Days per week: 0 days    Minutes per session: 0 min  . Stress: Only a little  Relationships  . Social connections    Talks on phone: More than three times a week    Gets together: Three times a week    Attends religious service: 1 to 4 times per year    Active member of club or organization: No    Attends meetings of clubs or organizations: Never    Relationship status: Married  . Intimate partner violence    Fear of current or ex partner: No    Emotionally abused: No    Physically abused: No    Forced sexual activity: No  Other Topics Concern  . Not on file  Social History Narrative   Lives at home with her husband,  independent at baseline      Review of Systems  Constitutional: Negative for chills, fatigue and unexpected weight change.  HENT: Negative for congestion, rhinorrhea, sneezing and sore throat.   Eyes: Negative for photophobia, pain and redness.  Respiratory: Negative for cough, chest tightness and shortness of breath.   Cardiovascular: Negative for chest pain and palpitations.  Gastrointestinal: Negative for abdominal pain, constipation, diarrhea, nausea and vomiting.  Endocrine: Negative.   Genitourinary: Negative for dysuria and frequency.  Musculoskeletal: Negative for arthralgias, back pain, joint swelling and neck pain.  Skin: Negative for rash.  Allergic/Immunologic: Negative.   Neurological: Negative for tremors and numbness.  Hematological: Negative for adenopathy. Does not bruise/bleed easily.  Psychiatric/Behavioral: Negative for behavioral problems and sleep disturbance. The patient is not nervous/anxious.     Vital Signs: BP 132/60   Pulse 67   Temp (!) 96.6 F (35.9 C)   Resp 16   Ht 5' 4.5" (1.638 m)   Wt 137 lb (62.1 kg)   SpO2 98%   BMI 23.15 kg/m    Physical Exam Vitals signs and nursing note reviewed.  Constitutional:      General: She is not in acute distress.    Appearance: She is well-developed. She is not diaphoretic.  HENT:     Head: Normocephalic and atraumatic.     Mouth/Throat:     Pharynx: No oropharyngeal exudate.  Eyes:     Pupils: Pupils are equal, round, and reactive to light.  Neck:     Musculoskeletal: Normal range of motion and neck supple.     Thyroid: No thyromegaly.     Vascular: No JVD.     Trachea: No tracheal deviation.  Cardiovascular:     Rate and Rhythm: Normal rate and regular rhythm.     Heart sounds: Normal heart sounds. No murmur. No friction rub. No gallop.   Pulmonary:     Effort: Pulmonary effort is normal. No respiratory distress.     Breath sounds: Normal breath sounds. No wheezing or rales.  Chest:      Chest wall: No tenderness.  Abdominal:     Palpations: Abdomen is soft.     Tenderness: There is no abdominal tenderness. There is no guarding.  Musculoskeletal: Normal range of motion.  Lymphadenopathy:     Cervical: No cervical adenopathy.  Skin:    General: Skin is warm and dry.  Neurological:     Mental Status: She is alert and oriented to person, place, and time.     Cranial Nerves: No cranial nerve deficit.  Psychiatric:        Behavior: Behavior normal.        Thought Content: Thought content normal.        Judgment: Judgment normal.    Assessment/Plan: 1. Colitis Continue to use Bentyl as prescribed as needed.  Continue to eat a bland diet for now.  May reintroduce other foods when she feels able.  Follow up as scheduled    2. Multiple myeloma, remission status unspecified (HCC) Stable, in remission.  Follow up as scheduled.   3. Essential hypertension Stable, pt is current off bp meds.  BP is good.  Continue present management.   General Counseling: ozie dimaria understanding of the findings of todays visit and agrees with plan of treatment. I have discussed any further diagnostic evaluation that may be needed or ordered today. We also reviewed her medications today. she has been encouraged to call the office with any questions or concerns that should arise related to todays visit.   No orders of the defined types were placed in this encounter.     I have reviewed all medical records from hospital follow up including radiology reports and consults from other physicians. Appropriate follow up diagnostics will be scheduled as needed. Patient/ Family understands the plan of treatment.   Time spent 15 minutes.  Orson Gear AGNP-C Internal Medicine

## 2019-08-14 DIAGNOSIS — C9001 Multiple myeloma in remission: Secondary | ICD-10-CM | POA: Diagnosis not present

## 2019-08-15 DIAGNOSIS — Z5112 Encounter for antineoplastic immunotherapy: Secondary | ICD-10-CM | POA: Diagnosis not present

## 2019-08-15 DIAGNOSIS — R0609 Other forms of dyspnea: Secondary | ICD-10-CM | POA: Diagnosis not present

## 2019-08-15 DIAGNOSIS — C9000 Multiple myeloma not having achieved remission: Secondary | ICD-10-CM | POA: Diagnosis not present

## 2019-08-30 DIAGNOSIS — R69 Illness, unspecified: Secondary | ICD-10-CM | POA: Diagnosis not present

## 2019-09-11 DIAGNOSIS — H6982 Other specified disorders of Eustachian tube, left ear: Secondary | ICD-10-CM | POA: Diagnosis not present

## 2019-09-11 DIAGNOSIS — R69 Illness, unspecified: Secondary | ICD-10-CM | POA: Diagnosis not present

## 2019-09-11 DIAGNOSIS — H6522 Chronic serous otitis media, left ear: Secondary | ICD-10-CM | POA: Diagnosis not present

## 2019-09-11 DIAGNOSIS — C9000 Multiple myeloma not having achieved remission: Secondary | ICD-10-CM | POA: Diagnosis not present

## 2019-09-11 DIAGNOSIS — H8111 Benign paroxysmal vertigo, right ear: Secondary | ICD-10-CM | POA: Diagnosis not present

## 2019-09-11 DIAGNOSIS — E785 Hyperlipidemia, unspecified: Secondary | ICD-10-CM | POA: Diagnosis not present

## 2019-09-11 DIAGNOSIS — I10 Essential (primary) hypertension: Secondary | ICD-10-CM | POA: Diagnosis not present

## 2019-09-11 DIAGNOSIS — E041 Nontoxic single thyroid nodule: Secondary | ICD-10-CM | POA: Diagnosis not present

## 2019-09-11 DIAGNOSIS — R5383 Other fatigue: Secondary | ICD-10-CM | POA: Diagnosis not present

## 2019-09-15 DIAGNOSIS — H8111 Benign paroxysmal vertigo, right ear: Secondary | ICD-10-CM | POA: Diagnosis not present

## 2019-09-19 DIAGNOSIS — Z5112 Encounter for antineoplastic immunotherapy: Secondary | ICD-10-CM | POA: Diagnosis not present

## 2019-09-19 DIAGNOSIS — R0609 Other forms of dyspnea: Secondary | ICD-10-CM | POA: Diagnosis not present

## 2019-09-19 DIAGNOSIS — C9001 Multiple myeloma in remission: Secondary | ICD-10-CM | POA: Diagnosis not present

## 2019-09-19 DIAGNOSIS — C9000 Multiple myeloma not having achieved remission: Secondary | ICD-10-CM | POA: Diagnosis not present

## 2019-09-22 ENCOUNTER — Other Ambulatory Visit: Payer: Self-pay

## 2019-09-22 MED ORDER — ATORVASTATIN CALCIUM 10 MG PO TABS: 10.0000 mg | ORAL_TABLET | Freq: Every day | ORAL | 0 refills | Status: DC

## 2019-09-26 DIAGNOSIS — R69 Illness, unspecified: Secondary | ICD-10-CM | POA: Diagnosis not present

## 2019-10-02 DIAGNOSIS — H6522 Chronic serous otitis media, left ear: Secondary | ICD-10-CM | POA: Diagnosis not present

## 2019-10-17 DIAGNOSIS — Z5112 Encounter for antineoplastic immunotherapy: Secondary | ICD-10-CM | POA: Diagnosis not present

## 2019-10-17 DIAGNOSIS — R0600 Dyspnea, unspecified: Secondary | ICD-10-CM | POA: Diagnosis not present

## 2019-10-17 DIAGNOSIS — C9001 Multiple myeloma in remission: Secondary | ICD-10-CM | POA: Diagnosis not present

## 2019-10-17 DIAGNOSIS — C9000 Multiple myeloma not having achieved remission: Secondary | ICD-10-CM | POA: Diagnosis not present

## 2019-10-26 DIAGNOSIS — H40003 Preglaucoma, unspecified, bilateral: Secondary | ICD-10-CM | POA: Diagnosis not present

## 2019-11-14 DIAGNOSIS — C9001 Multiple myeloma in remission: Secondary | ICD-10-CM | POA: Diagnosis not present

## 2019-11-14 DIAGNOSIS — Z5112 Encounter for antineoplastic immunotherapy: Secondary | ICD-10-CM | POA: Diagnosis not present

## 2019-11-14 DIAGNOSIS — C9000 Multiple myeloma not having achieved remission: Secondary | ICD-10-CM | POA: Diagnosis not present

## 2019-11-14 DIAGNOSIS — Z91041 Radiographic dye allergy status: Secondary | ICD-10-CM | POA: Diagnosis not present

## 2019-11-14 DIAGNOSIS — E041 Nontoxic single thyroid nodule: Secondary | ICD-10-CM | POA: Diagnosis not present

## 2019-11-14 DIAGNOSIS — C9002 Multiple myeloma in relapse: Secondary | ICD-10-CM | POA: Diagnosis not present

## 2019-11-14 DIAGNOSIS — R69 Illness, unspecified: Secondary | ICD-10-CM | POA: Diagnosis not present

## 2019-11-14 DIAGNOSIS — M8588 Other specified disorders of bone density and structure, other site: Secondary | ICD-10-CM | POA: Diagnosis not present

## 2019-11-14 DIAGNOSIS — R0600 Dyspnea, unspecified: Secondary | ICD-10-CM | POA: Diagnosis not present

## 2019-11-14 DIAGNOSIS — Z882 Allergy status to sulfonamides status: Secondary | ICD-10-CM | POA: Diagnosis not present

## 2019-11-14 DIAGNOSIS — Z886 Allergy status to analgesic agent status: Secondary | ICD-10-CM | POA: Diagnosis not present

## 2019-11-14 DIAGNOSIS — Z885 Allergy status to narcotic agent status: Secondary | ICD-10-CM | POA: Diagnosis not present

## 2019-11-14 DIAGNOSIS — R42 Dizziness and giddiness: Secondary | ICD-10-CM | POA: Diagnosis not present

## 2019-11-15 ENCOUNTER — Encounter: Payer: Self-pay | Admitting: Adult Health

## 2019-11-15 ENCOUNTER — Ambulatory Visit (INDEPENDENT_AMBULATORY_CARE_PROVIDER_SITE_OTHER): Payer: Medicare HMO | Admitting: Adult Health

## 2019-11-15 ENCOUNTER — Other Ambulatory Visit: Payer: Self-pay

## 2019-11-15 VITALS — BP 154/60 | HR 69 | Temp 97.0°F | Resp 16 | Ht 64.0 in | Wt 140.6 lb

## 2019-11-15 DIAGNOSIS — F411 Generalized anxiety disorder: Secondary | ICD-10-CM | POA: Diagnosis not present

## 2019-11-15 DIAGNOSIS — I10 Essential (primary) hypertension: Secondary | ICD-10-CM

## 2019-11-15 DIAGNOSIS — C9000 Multiple myeloma not having achieved remission: Secondary | ICD-10-CM

## 2019-11-15 DIAGNOSIS — R69 Illness, unspecified: Secondary | ICD-10-CM | POA: Diagnosis not present

## 2019-11-15 MED ORDER — LISINOPRIL 5 MG PO TABS: 5.0000 mg | ORAL_TABLET | Freq: Every day | ORAL | 0 refills | Status: DC

## 2019-11-15 NOTE — Progress Notes (Signed)
[facility] [place], [place] [address]  Internal MEDICINE  Office Visit Note  Patient Name: Sherry Oneal  [id number]  [id number]  Date of Service: 11/15/2019  Chief Complaint  Patient presents with  . Hypertension    pt back on take bp med: lisinopril 10 mg, pt bp was elevated at her appt for her infusion yesterday   . Hyperlipidemia    HPI  Pt is here for follow up on HTN, and HLD.  She reports she has been checking her blood pressure at home and noticed it has been increasing since her lisinopril was discontinued at a previous hospital admission.  She reports at an appt yesterday her BP was 190/80.  She feels like it is time to restart her lisinopril.  She does not wish to try a different medication, she would however like to start a lower dose. Denies Chest pain, Shortness of breath, palpitations, headache, or blurred vision.    Current Medication: Outpatient Encounter Medications as of 11/15/2019  Medication Sig  . acetaminophen (TYLENOL) 500 MG tablet Take 500 mg by mouth every 4 (four) hours as needed.   Marland Kitchen albuterol (PROVENTIL HFA;VENTOLIN HFA) 108 (90 Base) MCG/ACT inhaler Inhale 2 puffs into the lungs every 6 (six) hours as needed for wheezing or shortness of breath.  . ALPRAZolam (XANAX) 0.25 MG tablet Take 1 tablet (0.25 mg total) by mouth at bedtime as needed.  Marland Kitchen atorvastatin (LIPITOR) 10 MG tablet Take 1 tablet (10 mg total) by mouth daily.  . Calcium Carbonate-Vitamin D 600-400 MG-UNIT tablet Take 1 tablet by mouth daily.  . cholecalciferol (DELTA D3) 10 MCG (400 UNIT) TABS tablet Take 400 Units by mouth daily.  . Cranberry 500 MG CAPS Take 500 mg by mouth every morning.  Marland Kitchen DARATUMUMAB IV Inject into the vein every 30 (thirty) days.  Marland Kitchen dexamethasone (DECADRON) 6 MG tablet Take 12 mg by mouth every 30 (thirty) days.   Marland Kitchen dicyclomine (BENTYL) 10 MG capsule Take 1 capsule (10 mg total) by mouth 3 (three) times daily with meals as needed for  spasms.  . diphenhydrAMINE (BENADRYL ALLERGY) 25 MG tablet Take 25 mg by mouth daily as needed.  . docusate (COLACE) 50 MG/5ML liquid Take by mouth daily as needed for mild constipation.  Marland Kitchen ELIQUIS 5 MG TABS tablet Take 1 tablet by mouth 2 (two) times daily.  . fluticasone (FLONASE) 50 MCG/ACT nasal spray Place into both nostrils daily.  Marland Kitchen ipratropium-albuterol (DUONEB) 0.5-2.5 (3) MG/3ML SOLN Take 3 mLs by nebulization every 6 (six) hours as needed.  . meclizine (ANTIVERT) 25 MG tablet Take 25 mg by mouth as needed for dizziness.  . Multiple Vitamins-Minerals (PRESERVISION AREDS PO) Take 1 tablet by mouth every morning.  Marland Kitchen omeprazole (PRILOSEC) 20 MG capsule Take 20 mg by mouth daily.  . ondansetron (ZOFRAN-ODT) 4 MG disintegrating tablet DISSOLVE 1 TABLET IN MOUTH EVERY 8 HOURS AS NEEDED FOR NAUSEA  . polyethylene glycol (MIRALAX / GLYCOLAX) packet Take 17 g by mouth daily as needed.  . pomalidomide (POMALYST) 4 MG capsule Take 1 capsule by mouth once daily for days 1-21. 7 days rest.  . prochlorperazine (COMPAZINE) 5 MG tablet Take 5 mg by mouth every 8 (eight) hours as needed.  . senna (SENOKOT) 8.6 MG tablet Take 1 tablet by mouth daily as needed for constipation.  . valACYclovir (VALTREX) 500 MG tablet Take 500 mg by mouth daily.  Marland Kitchen lisinopril (ZESTRIL) 5 MG tablet Take 1 tablet (5 mg total)  by mouth daily.  . [DISCONTINUED] guaiFENesin-codeine (VIRTUSSIN A/C) 100-10 MG/5ML syrup Take 5 mLs by mouth every 6 (six) hours as needed for cough.   No facility-administered encounter medications on file as of 11/15/2019.     Surgical History: Past Surgical History:  Procedure Laterality Date  . CATARACT EXTRACTION W/PHACO Right 08/17/2018   Procedure: CATARACT EXTRACTION PHACO AND INTRAOCULAR LENS PLACEMENT ([place])  RIGHT;  Surgeon: Leandrew Koyanagi, MD;  Location: [place];  Service: Ophthalmology;  Laterality: Right;  . CATARACT EXTRACTION W/PHACO Left 11/09/2018   Procedure:  CATARACT EXTRACTION PHACO AND INTRAOCULAR LENS PLACEMENT ([place]) LEFT;  Surgeon: Leandrew Koyanagi, MD;  Location: [place];  Service: Ophthalmology;  Laterality: Left;  . EXPLORATORY LAPAROTOMY      Medical History: Past Medical History:  Diagnosis Date  . Anemia   . Arthritis    lower back  . High blood pressure   . High cholesterol   . Multiple myeloma ([place])   . Renal insufficiency   . Wears dentures    partial upper    Family History: Family History  Problem Relation Age of Onset  . Breast cancer Maternal Grandmother 58  . CAD Mother   . CVA Mother     Social History   Socioeconomic History  . Marital status: Married    Spouse name: Not on file  . Number of children: 1  . Years of education: Not on file  . Highest education level: Not on file  Occupational History  . Not on file  Social Needs  . Financial resource strain: Not hard at all  . Food insecurity    Worry: Never true    Inability: Never true  . Transportation needs    Medical: No    Non-medical: No  Tobacco Use  . Smoking status: Never Smoker  . Smokeless tobacco: Never Used  Substance and Sexual Activity  . Alcohol use: No  . Drug use: No  . Sexual activity: Not Currently    Birth control/protection: None  Lifestyle  . Physical activity    Days per week: 0 days    Minutes per session: 0 min  . Stress: Only a little  Relationships  . Social connections    Talks on phone: More than three times a week    Gets together: Three times a week    Attends religious service: 1 to 4 times per year    Active member of club or organization: No    Attends meetings of clubs or organizations: Never    Relationship status: Married  . Intimate partner violence    Fear of current or ex partner: No    Emotionally abused: No    Physically abused: No    Forced sexual activity: No  Other Topics Concern  . Not on file  Social History Narrative   Lives at home with her husband, independent at  baseline      Review of Systems  Constitutional: Negative for chills, fatigue and unexpected weight change.  HENT: Negative for congestion, rhinorrhea, sneezing and sore throat.   Eyes: Negative for photophobia, pain and redness.  Respiratory: Negative for cough, chest tightness and shortness of breath.   Cardiovascular: Negative for chest pain and palpitations.  Gastrointestinal: Negative for abdominal pain, constipation, diarrhea, nausea and vomiting.  Endocrine: Negative.   Genitourinary: Negative for dysuria and frequency.  Musculoskeletal: Negative for arthralgias, back pain, joint swelling and neck pain.  Skin: Negative for rash.  Allergic/Immunologic: Negative.  Neurological: Negative for tremors and numbness.  Hematological: Negative for adenopathy. Does not bruise/bleed easily.  Psychiatric/Behavioral: Negative for behavioral problems and sleep disturbance. The patient is not nervous/anxious.     Vital Signs: BP (!) 154/60   Pulse 69   Temp (!) 97 F (36.1 C)   Resp 16   Ht 5' 4"  (1.626 m)   Wt 140 lb 9.6 oz (63.8 kg)   SpO2 99%   BMI 24.13 kg/m    Physical Exam Vitals signs and nursing note reviewed.  Constitutional:      General: She is not in acute distress.    Appearance: She is well-developed. She is not diaphoretic.  HENT:     Head: Normocephalic and atraumatic.     Mouth/Throat:     Pharynx: No oropharyngeal exudate.  Eyes:     Pupils: Pupils are equal, round, and reactive to light.  Neck:     Musculoskeletal: Normal range of motion and neck supple.     Thyroid: No thyromegaly.     Vascular: No JVD.     Trachea: No tracheal deviation.  Cardiovascular:     Rate and Rhythm: Normal rate and regular rhythm.     Heart sounds: Normal heart sounds. No murmur. No friction rub. No gallop.   Pulmonary:     Effort: Pulmonary effort is normal. No respiratory distress.     Breath sounds: Normal breath sounds. No wheezing or rales.  Chest:     Chest wall:  No tenderness.  Abdominal:     Palpations: Abdomen is soft.     Tenderness: There is no abdominal tenderness. There is no guarding.  Musculoskeletal: Normal range of motion.  Lymphadenopathy:     Cervical: No cervical adenopathy.  Skin:    General: Skin is warm and dry.  Neurological:     Mental Status: She is alert and oriented to person, place, and time.     Cranial Nerves: No cranial nerve deficit.  Psychiatric:        Behavior: Behavior normal.        Thought Content: Thought content normal.        Judgment: Judgment normal.    Assessment/Plan: 1. HTN (hypertension), benign Will start with 75m lisinopril and follow up in 4 weeks.  Pt will keep bp log.  - lisinopril (ZESTRIL) 5 MG tablet; Take 1 tablet (5 mg total) by mouth daily.  Dispense: 90 tablet; Refill: 0  2. Multiple myeloma, remission status unspecified ([place] Continue treatment plan with [facility] as discussed.   3. Generalized anxiety disorder Pt does get steroid injections for her MM treatment which makes her extra anxious.  She will use xanax intermittently as needed.  Continue present therapy.   General Counseling: Lyalena colonunderstanding of the findings of todays visit and agrees with plan of treatment. I have discussed any further diagnostic evaluation that may be needed or ordered today. We also reviewed her medications today. she has been encouraged to call the office with any questions or concerns that should arise related to todays visit.    No orders of the defined types were placed in this encounter.   Meds ordered this encounter  Medications  . lisinopril (ZESTRIL) 5 MG tablet    Sig: Take 1 tablet (5 mg total) by mouth daily.    Dispense:  90 tablet    Refill:  0    Time spent: 25 Minutes   This patient was seen by AOrson GearAGNP-C in Collaboration with Dr  Lavera Guise as a part of collaborative care agreement     Kendell Bane AGNP-C Internal medicine

## 2019-11-16 ENCOUNTER — Telehealth: Payer: Self-pay

## 2019-11-16 NOTE — Telephone Encounter (Signed)
CONFIRMED AND SCREENED FOR COVID FOR 11-20-19 OV.

## 2019-11-20 ENCOUNTER — Other Ambulatory Visit: Payer: Self-pay

## 2019-11-20 ENCOUNTER — Encounter: Payer: Self-pay | Admitting: Nurse Practitioner

## 2019-11-20 ENCOUNTER — Ambulatory Visit (INDEPENDENT_AMBULATORY_CARE_PROVIDER_SITE_OTHER): Payer: Medicare HMO | Admitting: Nurse Practitioner

## 2019-11-20 VITALS — BP 143/50 | HR 72 | Temp 97.4°F | Resp 16 | Ht 64.0 in | Wt 137.0 lb

## 2019-11-20 DIAGNOSIS — R300 Dysuria: Secondary | ICD-10-CM

## 2019-11-20 DIAGNOSIS — C9000 Multiple myeloma not having achieved remission: Secondary | ICD-10-CM

## 2019-11-20 DIAGNOSIS — I10 Essential (primary) hypertension: Secondary | ICD-10-CM | POA: Diagnosis not present

## 2019-11-20 DIAGNOSIS — Z1231 Encounter for screening mammogram for malignant neoplasm of breast: Secondary | ICD-10-CM

## 2019-11-20 DIAGNOSIS — Z0001 Encounter for general adult medical examination with abnormal findings: Secondary | ICD-10-CM

## 2019-11-20 NOTE — Progress Notes (Signed)
[facility] [place], [place] [address]  Internal MEDICINE  Office Visit Note  Patient Name: Sherry Oneal  [id number]  [id number]  Date of Service: 11/20/2019   Pt is here for routine health maintenance examination   Chief Complaint  Patient presents with  . Annual Exam  . Hypertension  . Hyperlipidemia  . Arthritis     The patient is here for health maintenance exam. She states that she is feeling well. She goes to cancer center monthly due to multiple myeloma. Levels have been good and she has no concerns or complaints. She is due to have routine, fasting labs done. She will be due to have screening mammogram in 01/2020. All meds are up to date. She, intermittently, takes alprazolam when needed. The last prescription was written for her in 10/2018. She does not need to have new prescription today.    Current Medication: Outpatient Encounter Medications as of 11/20/2019  Medication Sig  . acetaminophen (TYLENOL) 500 MG tablet Take 500 mg by mouth every 4 (four) hours as needed.   Marland Kitchen albuterol (PROVENTIL HFA;VENTOLIN HFA) 108 (90 Base) MCG/ACT inhaler Inhale 2 puffs into the lungs every 6 (six) hours as needed for wheezing or shortness of breath.  . ALPRAZolam (XANAX) 0.25 MG tablet Take 1 tablet (0.25 mg total) by mouth at bedtime as needed.  Marland Kitchen atorvastatin (LIPITOR) 10 MG tablet Take 1 tablet (10 mg total) by mouth daily.  . Calcium Carbonate-Vitamin D 600-400 MG-UNIT tablet Take 1 tablet by mouth daily.  . cholecalciferol (DELTA D3) 10 MCG (400 UNIT) TABS tablet Take 400 Units by mouth daily.  . Cranberry 500 MG CAPS Take 500 mg by mouth every morning.  Marland Kitchen DARATUMUMAB IV Inject into the vein every 30 (thirty) days.  Marland Kitchen dexamethasone (DECADRON) 6 MG tablet Take 12 mg by mouth every 30 (thirty) days.   Marland Kitchen dicyclomine (BENTYL) 10 MG capsule Take 1 capsule (10 mg total) by mouth 3 (three) times daily with meals as needed for spasms.  . diphenhydrAMINE  (BENADRYL ALLERGY) 25 MG tablet Take 25 mg by mouth daily as needed.  . docusate (COLACE) 50 MG/5ML liquid Take by mouth daily as needed for mild constipation.  Marland Kitchen ELIQUIS 5 MG TABS tablet Take 1 tablet by mouth 2 (two) times daily.  . fluticasone (FLONASE) 50 MCG/ACT nasal spray Place into both nostrils daily.  Marland Kitchen ipratropium-albuterol (DUONEB) 0.5-2.5 (3) MG/3ML SOLN Take 3 mLs by nebulization every 6 (six) hours as needed.  Marland Kitchen lisinopril (ZESTRIL) 5 MG tablet Take 1 tablet (5 mg total) by mouth daily.  . meclizine (ANTIVERT) 25 MG tablet Take 25 mg by mouth as needed for dizziness.  . Multiple Vitamins-Minerals (PRESERVISION AREDS PO) Take 1 tablet by mouth every morning.  Marland Kitchen omeprazole (PRILOSEC) 20 MG capsule Take 20 mg by mouth daily.  . ondansetron (ZOFRAN-ODT) 4 MG disintegrating tablet DISSOLVE 1 TABLET IN MOUTH EVERY 8 HOURS AS NEEDED FOR NAUSEA  . polyethylene glycol (MIRALAX / GLYCOLAX) packet Take 17 g by mouth daily as needed.  . pomalidomide (POMALYST) 4 MG capsule Take 1 capsule by mouth once daily for days 1-21. 7 days rest.  . prochlorperazine (COMPAZINE) 5 MG tablet Take 5 mg by mouth every 8 (eight) hours as needed.  . senna (SENOKOT) 8.6 MG tablet Take 1 tablet by mouth daily as needed for constipation.  . valACYclovir (VALTREX) 500 MG tablet Take 500 mg by mouth daily.   No facility-administered encounter medications on file as  of 11/20/2019.     Surgical History: Past Surgical History:  Procedure Laterality Date  . CATARACT EXTRACTION W/PHACO Right 08/17/2018   Procedure: CATARACT EXTRACTION PHACO AND INTRAOCULAR LENS PLACEMENT ([place])  RIGHT;  Surgeon: Leandrew Koyanagi, MD;  Location: [place];  Service: Ophthalmology;  Laterality: Right;  . CATARACT EXTRACTION W/PHACO Left 11/09/2018   Procedure: CATARACT EXTRACTION PHACO AND INTRAOCULAR LENS PLACEMENT ([place]) LEFT;  Surgeon: Leandrew Koyanagi, MD;  Location: [place];  Service: Ophthalmology;   Laterality: Left;  . EXPLORATORY LAPAROTOMY      Medical History: Past Medical History:  Diagnosis Date  . Anemia   . Arthritis    lower back  . High blood pressure   . High cholesterol   . Multiple myeloma ([place])   . Renal insufficiency   . Wears dentures    partial upper    Family History: Family History  Problem Relation Age of Onset  . Breast cancer Maternal Grandmother 5  . CAD Mother   . CVA Mother       Review of Systems  Constitutional: Negative for activity change, chills, fatigue and unexpected weight change.  HENT: Negative for congestion, postnasal drip, rhinorrhea, sneezing and sore throat.   Respiratory: Negative for cough, chest tightness, shortness of breath and wheezing.   Cardiovascular: Negative for chest pain and palpitations.  Gastrointestinal: Negative for abdominal pain, constipation, diarrhea, nausea and vomiting.  Endocrine: Negative for cold intolerance, heat intolerance, polydipsia and polyuria.  Genitourinary: Negative for dysuria, frequency and urgency.  Musculoskeletal: Negative for arthralgias, back pain, joint swelling and neck pain.  Skin: Negative for rash.  Allergic/Immunologic: Negative for environmental allergies.  Neurological: Negative for tremors, numbness and headaches.  Hematological: Negative for adenopathy. Does not bruise/bleed easily.  Psychiatric/Behavioral: Negative for behavioral problems (Depression), sleep disturbance and suicidal ideas. The patient is not nervous/anxious.      Today's Vitals   11/20/19 0847  BP: (!) 143/50  Pulse: 72  Resp: 16  Temp: (!) 97.4 F (36.3 C)  SpO2: 99%  Weight: 137 lb (62.1 kg)  Height: 5' 4"  (1.626 m)   Body mass index is 23.52 kg/m.  Physical Exam Vitals signs and nursing note reviewed.  Constitutional:      General: She is not in acute distress.    Appearance: Normal appearance. She is well-developed. She is not diaphoretic.  HENT:     Head: Normocephalic and  atraumatic.     Nose: Nose normal.     Mouth/Throat:     Pharynx: No oropharyngeal exudate.  Eyes:     Extraocular Movements: Extraocular movements intact.     Pupils: Pupils are equal, round, and reactive to light.  Neck:     Musculoskeletal: Normal range of motion and neck supple.     Thyroid: No thyromegaly.     Vascular: No JVD.     Trachea: No tracheal deviation.  Cardiovascular:     Rate and Rhythm: Normal rate and regular rhythm.     Pulses: Normal pulses.     Heart sounds: Normal heart sounds. No murmur. No friction rub. No gallop.   Pulmonary:     Effort: Pulmonary effort is normal. No respiratory distress.     Breath sounds: Normal breath sounds. No wheezing or rales.  Chest:     Chest wall: No tenderness.     Breasts:        Right: Normal. No swelling, bleeding, inverted nipple, mass, nipple discharge, skin change or tenderness.  Left: Normal. No swelling, bleeding, inverted nipple, mass, nipple discharge, skin change or tenderness.  Abdominal:     General: Bowel sounds are normal.     Palpations: Abdomen is soft.     Tenderness: There is no abdominal tenderness.  Musculoskeletal: Normal range of motion.  Lymphadenopathy:     Cervical: No cervical adenopathy.     Upper Body:     Right upper body: No axillary adenopathy.     Left upper body: No axillary adenopathy.  Skin:    General: Skin is warm and dry.     Capillary Refill: Capillary refill takes less than 2 seconds.  Neurological:     General: No focal deficit present.     Mental Status: She is alert and oriented to person, place, and time.     Cranial Nerves: No cranial nerve deficit.  Psychiatric:        Mood and Affect: Mood normal.        Behavior: Behavior normal.        Thought Content: Thought content normal.        Judgment: Judgment normal.    Depression screen [facility] 2/9 11/20/2019 08/07/2019 11/17/2018 08/26/2018 05/27/2018  Decreased Interest 0 0 0 0 0  Down, Depressed, Hopeless 0 0 0 0 0   PHQ - 2 Score 0 0 0 0 0    Functional Status Survey: Is the patient deaf or have difficulty hearing?: Yes(hearing aids) Does the patient have difficulty seeing, even when wearing glasses/contacts?: No Does the patient have difficulty concentrating, remembering, or making decisions?: No Does the patient have difficulty walking or climbing stairs?: No Does the patient have difficulty dressing or bathing?: No Does the patient have difficulty doing errands alone such as visiting a doctor's office or shopping?: No  MMSE - [place] Exam 11/20/2019 11/17/2018  Orientation to time 5 5  Orientation to Place 5 5  Registration 3 3  Attention/ Calculation 5 5  Recall 3 3  Language- name 2 objects 2 2  Language- repeat 1 1  Language- follow 3 step command 3 3  Language- read & follow direction 1 1  Write a sentence 1 1  Copy design 1 1  Total score 30 30    Fall Risk  11/20/2019 08/07/2019 11/17/2018 11/17/2018 08/26/2018  Falls in the past year? 1 0 0 0 No  Number falls in past yr: 0 0 0 0 -  Comment vertigo - - - -  Injury with Fall? 0 0 0 0 -    Assessment/Plan: 1. Encounter for general adult medical examination with abnormal findings Annual health maintenance exam today.   2. Essential hypertension Stable. Continue bp medication as prescribed   3. Multiple myeloma, remission status unspecified ([place]) Continue regular visits with oncologist as scheduled.   4. Encounter for screening mammogram for malignant neoplasm of breast - scrrening mammo; Future  5. Dysuria - UA/M w/rflx Culture, Routine  General Counseling: Sandy Salaam understanding of the findings of todays visit and agrees with plan of treatment. I have discussed any further diagnostic evaluation that may be needed or ordered today. We also reviewed her medications today. she has been encouraged to call the office with any questions or concerns that should arise related to todays  visit.    Counseling:  This patient was seen by Leretha Pol FNP Collaboration with Dr Lavera Guise as a part of collaborative care agreement  Orders Placed This Encounter  Procedures  . scrrening mammo  .  UA/M w/rflx Culture, Routine     Time spent: [place], MD  Internal Medicine

## 2019-12-11 ENCOUNTER — Other Ambulatory Visit: Payer: Self-pay

## 2019-12-11 MED ORDER — ATORVASTATIN CALCIUM 10 MG PO TABS: 10.0000 mg | ORAL_TABLET | Freq: Every day | ORAL | 0 refills | Status: DC

## 2019-12-12 ENCOUNTER — Telehealth: Payer: Self-pay

## 2019-12-12 DIAGNOSIS — R0600 Dyspnea, unspecified: Secondary | ICD-10-CM | POA: Diagnosis not present

## 2019-12-12 DIAGNOSIS — C9001 Multiple myeloma in remission: Secondary | ICD-10-CM | POA: Diagnosis not present

## 2019-12-12 DIAGNOSIS — Z5112 Encounter for antineoplastic immunotherapy: Secondary | ICD-10-CM | POA: Diagnosis not present

## 2019-12-12 DIAGNOSIS — E785 Hyperlipidemia, unspecified: Secondary | ICD-10-CM | POA: Diagnosis not present

## 2019-12-12 DIAGNOSIS — C9000 Multiple myeloma not having achieved remission: Secondary | ICD-10-CM | POA: Diagnosis not present

## 2019-12-12 NOTE — Telephone Encounter (Signed)
Confirmed appointment with patients husband. Jerrell Mylar

## 2019-12-14 ENCOUNTER — Encounter: Payer: Self-pay | Admitting: Adult Health

## 2019-12-14 ENCOUNTER — Other Ambulatory Visit: Payer: Self-pay

## 2019-12-14 ENCOUNTER — Ambulatory Visit (INDEPENDENT_AMBULATORY_CARE_PROVIDER_SITE_OTHER): Payer: Medicare HMO | Admitting: Adult Health

## 2019-12-14 VITALS — BP 125/65

## 2019-12-14 DIAGNOSIS — F411 Generalized anxiety disorder: Secondary | ICD-10-CM

## 2019-12-14 DIAGNOSIS — K529 Noninfective gastroenteritis and colitis, unspecified: Secondary | ICD-10-CM

## 2019-12-14 DIAGNOSIS — C9000 Multiple myeloma not having achieved remission: Secondary | ICD-10-CM

## 2019-12-14 DIAGNOSIS — R69 Illness, unspecified: Secondary | ICD-10-CM | POA: Diagnosis not present

## 2019-12-14 DIAGNOSIS — I10 Essential (primary) hypertension: Secondary | ICD-10-CM | POA: Diagnosis not present

## 2019-12-14 MED ORDER — DICYCLOMINE HCL 10 MG PO CAPS: 10.0000 mg | ORAL_CAPSULE | Freq: Three times a day (TID) | ORAL | 0 refills | Status: DC | PRN

## 2019-12-14 NOTE — Progress Notes (Signed)
[facility] [place], [place] [address]  Internal MEDICINE  Telephone Visit  Patient Name: Sherry Oneal  [id number]  [id number]  Date of Service: 12/14/2019  I connected with the patient at 841 by telephone and verified the patients identity using two identifiers.   I discussed the limitations, risks, security and privacy concerns of performing an evaluation and management service by telephone and the availability of in person appointments. I also discussed with the patient that there may be a patient responsible charge related to the service.  The patient expressed understanding and agrees to proceed.    Chief Complaint  Patient presents with  . Telephone Screen  . Telephone Assessment  . Hypertension    HPI  Pt see via telephone for follow up on HTN. Overall she is doing well.  Her home blood pressure log shows BP's that are 110-125/60's.  She Denies Chest pain, Shortness of breath, palpitations, headache, or blurred vision.    Current Medication: Outpatient Encounter Medications as of 12/14/2019  Medication Sig  . acetaminophen (TYLENOL) 500 MG tablet Take 500 mg by mouth every 4 (four) hours as needed.   Marland Kitchen albuterol (PROVENTIL HFA;VENTOLIN HFA) 108 (90 Base) MCG/ACT inhaler Inhale 2 puffs into the lungs every 6 (six) hours as needed for wheezing or shortness of breath.  . ALPRAZolam (XANAX) 0.25 MG tablet Take 1 tablet (0.25 mg total) by mouth at bedtime as needed.  Marland Kitchen atorvastatin (LIPITOR) 10 MG tablet Take 1 tablet (10 mg total) by mouth daily.  . Calcium Carbonate-Vitamin D 600-400 MG-UNIT tablet Take 1 tablet by mouth daily.  . cholecalciferol (DELTA D3) 10 MCG (400 UNIT) TABS tablet Take 400 Units by mouth daily.  . Cranberry 500 MG CAPS Take 500 mg by mouth every morning.  Marland Kitchen DARATUMUMAB IV Inject into the vein every 30 (thirty) days.  Marland Kitchen dexamethasone (DECADRON) 6 MG tablet Take 12 mg by mouth every 30 (thirty) days.   . diphenhydrAMINE  (BENADRYL ALLERGY) 25 MG tablet Take 25 mg by mouth daily as needed.  . docusate (COLACE) 50 MG/5ML liquid Take by mouth daily as needed for mild constipation.  Marland Kitchen ELIQUIS 5 MG TABS tablet Take 1 tablet by mouth 2 (two) times daily.  . fluticasone (FLONASE) 50 MCG/ACT nasal spray Place into both nostrils daily.  Marland Kitchen ipratropium-albuterol (DUONEB) 0.5-2.5 (3) MG/3ML SOLN Take 3 mLs by nebulization every 6 (six) hours as needed.  Marland Kitchen lisinopril (ZESTRIL) 5 MG tablet Take 1 tablet (5 mg total) by mouth daily.  . meclizine (ANTIVERT) 25 MG tablet Take 25 mg by mouth as needed for dizziness.  . Multiple Vitamins-Minerals (PRESERVISION AREDS PO) Take 1 tablet by mouth every morning.  Marland Kitchen omeprazole (PRILOSEC) 20 MG capsule Take 20 mg by mouth daily.  . ondansetron (ZOFRAN-ODT) 4 MG disintegrating tablet DISSOLVE 1 TABLET IN MOUTH EVERY 8 HOURS AS NEEDED FOR NAUSEA  . polyethylene glycol (MIRALAX / GLYCOLAX) packet Take 17 g by mouth daily as needed.  . pomalidomide (POMALYST) 4 MG capsule Take 1 capsule by mouth once daily for days 1-21. 7 days rest.  . prochlorperazine (COMPAZINE) 5 MG tablet Take 5 mg by mouth every 8 (eight) hours as needed.  . senna (SENOKOT) 8.6 MG tablet Take 1 tablet by mouth daily as needed for constipation.  . valACYclovir (VALTREX) 500 MG tablet Take 500 mg by mouth daily.  . [DISCONTINUED] dicyclomine (BENTYL) 10 MG capsule Take 1 capsule (10 mg total) by mouth 3 (three) times daily  with meals as needed for spasms.  Marland Kitchen dicyclomine (BENTYL) 10 MG capsule Take 1 capsule (10 mg total) by mouth 3 (three) times daily with meals as needed for spasms.   No facility-administered encounter medications on file as of 12/14/2019.    Surgical History: Past Surgical History:  Procedure Laterality Date  . CATARACT EXTRACTION W/PHACO Right 08/17/2018   Procedure: CATARACT EXTRACTION PHACO AND INTRAOCULAR LENS PLACEMENT ([place])  RIGHT;  Surgeon: Leandrew Koyanagi, MD;  Location: [place];  Service: Ophthalmology;  Laterality: Right;  . CATARACT EXTRACTION W/PHACO Left 11/09/2018   Procedure: CATARACT EXTRACTION PHACO AND INTRAOCULAR LENS PLACEMENT ([facility]) LEFT;  Surgeon: Leandrew Koyanagi, MD;  Location: [facility];  Service: Ophthalmology;  Laterality: Left;  . EXPLORATORY LAPAROTOMY      Medical History: Past Medical History:  Diagnosis Date  . Anemia   . Arthritis    lower back  . High blood pressure   . High cholesterol   . Multiple myeloma ([place])   . Renal insufficiency   . Wears dentures    partial upper    Family History: Family History  Problem Relation Age of Onset  . Breast cancer Maternal Grandmother 20  . CAD Mother   . CVA Mother     Social History   Socioeconomic History  . Marital status: Married    Spouse name: Not on file  . Number of children: 1  . Years of education: Not on file  . Highest education level: Not on file  Occupational History  . Not on file  Tobacco Use  . Smoking status: Never Smoker  . Smokeless tobacco: Never Used  Substance and Sexual Activity  . Alcohol use: No  . Drug use: No  . Sexual activity: Not Currently    Birth control/protection: None  Other Topics Concern  . Not on file  Social History Narrative   Lives at home with her husband, independent at baseline   Social Determinants of Health   Financial Resource Strain:   . Difficulty of Paying Living Expenses: Not on file  Food Insecurity:   . Worried About Charity fundraiser in the Last Year: Not on file  . Ran Out of Food in the Last Year: Not on file  Transportation Needs:   . Lack of Transportation (Medical): Not on file  . Lack of Transportation (Non-Medical): Not on file  Physical Activity:   . Days of Exercise per Week: Not on file  . Minutes of Exercise per Session: Not on file  Stress:   . Feeling of Stress : Not on file  Social Connections:   . Frequency of Communication with Friends and Family: Not on file  .  Frequency of Social Gatherings with Friends and Family: Not on file  . Attends Religious Services: Not on file  . Active Member of Clubs or Organizations: Not on file  . Attends Archivist Meetings: Not on file  . Marital Status: Not on file  Intimate Partner Violence:   . Fear of Current or Ex-Partner: Not on file  . Emotionally Abused: Not on file  . Physically Abused: Not on file  . Sexually Abused: Not on file      Review of Systems  Vital Signs: BP 125/65    Observation/Objective:     Assessment/Plan: 1. Essential hypertension Appears controlled mostly, she does get elevated readings when she is at the cancer center receiving treatment.  Denies feeling bad.  Continue Lisinopril at 44m daily,  and follow up in office in 8 weeks.   2. Colitis Pt requesting refill on Bentyl, she intermittently uses this medication for cramping and intestinal spasm.  - dicyclomine (BENTYL) 10 MG capsule; Take 1 capsule (10 mg total) by mouth 3 (three) times daily with meals as needed for spasms.  Dispense: 30 capsule; Refill: 0  3. Generalized anxiety disorder Overall doing well.  She reports intermittent issues, overall feels controlled. Continue present management.   4. Multiple myeloma, remission status unspecified ([place]) Continue to follow treatment plan per cancer center.   General Counseling: destiny hagin understanding of the findings of today's phone visit and agrees with plan of treatment. I have discussed any further diagnostic evaluation that may be needed or ordered today. We also reviewed her medications today. she has been encouraged to call the office with any questions or concerns that should arise related to todays visit.    No orders of the defined types were placed in this encounter.   Meds ordered this encounter  Medications  . dicyclomine (BENTYL) 10 MG capsule    Sig: Take 1 capsule (10 mg total) by mouth 3 (three) times daily with meals as needed for  spasms.    Dispense:  30 capsule    Refill:  0    Time spent: [place] AGNP-C Internal medicine

## 2020-01-08 ENCOUNTER — Other Ambulatory Visit: Payer: Self-pay | Admitting: Nurse Practitioner

## 2020-01-08 DIAGNOSIS — Z1231 Encounter for screening mammogram for malignant neoplasm of breast: Secondary | ICD-10-CM

## 2020-01-09 DIAGNOSIS — R0600 Dyspnea, unspecified: Secondary | ICD-10-CM | POA: Diagnosis not present

## 2020-01-09 DIAGNOSIS — Z5112 Encounter for antineoplastic immunotherapy: Secondary | ICD-10-CM | POA: Diagnosis not present

## 2020-01-09 DIAGNOSIS — C9000 Multiple myeloma not having achieved remission: Secondary | ICD-10-CM | POA: Diagnosis not present

## 2020-02-06 ENCOUNTER — Telehealth: Payer: Self-pay

## 2020-02-06 DIAGNOSIS — C9000 Multiple myeloma not having achieved remission: Secondary | ICD-10-CM | POA: Diagnosis not present

## 2020-02-06 DIAGNOSIS — Z91041 Radiographic dye allergy status: Secondary | ICD-10-CM | POA: Diagnosis not present

## 2020-02-06 DIAGNOSIS — R0600 Dyspnea, unspecified: Secondary | ICD-10-CM | POA: Diagnosis not present

## 2020-02-06 DIAGNOSIS — Z882 Allergy status to sulfonamides status: Secondary | ICD-10-CM | POA: Diagnosis not present

## 2020-02-06 DIAGNOSIS — R05 Cough: Secondary | ICD-10-CM | POA: Diagnosis not present

## 2020-02-06 DIAGNOSIS — Z886 Allergy status to analgesic agent status: Secondary | ICD-10-CM | POA: Diagnosis not present

## 2020-02-06 DIAGNOSIS — Z880 Allergy status to penicillin: Secondary | ICD-10-CM | POA: Diagnosis not present

## 2020-02-06 DIAGNOSIS — R42 Dizziness and giddiness: Secondary | ICD-10-CM | POA: Diagnosis not present

## 2020-02-06 DIAGNOSIS — N261 Atrophy of kidney (terminal): Secondary | ICD-10-CM | POA: Diagnosis not present

## 2020-02-06 DIAGNOSIS — C9001 Multiple myeloma in remission: Secondary | ICD-10-CM | POA: Diagnosis not present

## 2020-02-06 DIAGNOSIS — M8588 Other specified disorders of bone density and structure, other site: Secondary | ICD-10-CM | POA: Diagnosis not present

## 2020-02-06 DIAGNOSIS — R062 Wheezing: Secondary | ICD-10-CM | POA: Diagnosis not present

## 2020-02-06 DIAGNOSIS — F4321 Adjustment disorder with depressed mood: Secondary | ICD-10-CM | POA: Diagnosis not present

## 2020-02-06 DIAGNOSIS — D61810 Antineoplastic chemotherapy induced pancytopenia: Secondary | ICD-10-CM | POA: Diagnosis not present

## 2020-02-06 DIAGNOSIS — Z79899 Other long term (current) drug therapy: Secondary | ICD-10-CM | POA: Diagnosis not present

## 2020-02-06 DIAGNOSIS — E041 Nontoxic single thyroid nodule: Secondary | ICD-10-CM | POA: Diagnosis not present

## 2020-02-06 DIAGNOSIS — F329 Major depressive disorder, single episode, unspecified: Secondary | ICD-10-CM | POA: Diagnosis not present

## 2020-02-06 DIAGNOSIS — Z885 Allergy status to narcotic agent status: Secondary | ICD-10-CM | POA: Diagnosis not present

## 2020-02-06 DIAGNOSIS — N281 Cyst of kidney, acquired: Secondary | ICD-10-CM | POA: Diagnosis not present

## 2020-02-06 DIAGNOSIS — Z5112 Encounter for antineoplastic immunotherapy: Secondary | ICD-10-CM | POA: Diagnosis not present

## 2020-02-06 NOTE — Telephone Encounter (Signed)
Confirmed appointment on 02/08/2020 and screened for covid. klh 

## 2020-02-08 ENCOUNTER — Ambulatory Visit: Payer: Medicare HMO | Admitting: Adult Health

## 2020-02-09 ENCOUNTER — Telehealth: Payer: Self-pay

## 2020-02-09 NOTE — Telephone Encounter (Signed)
Called confirmed appointment on 02/13/2020 and screened for covid. klh

## 2020-02-13 ENCOUNTER — Encounter: Payer: Self-pay | Admitting: Adult Health

## 2020-02-13 ENCOUNTER — Other Ambulatory Visit: Payer: Self-pay

## 2020-02-13 ENCOUNTER — Ambulatory Visit (INDEPENDENT_AMBULATORY_CARE_PROVIDER_SITE_OTHER): Payer: PPO | Admitting: Adult Health

## 2020-02-13 VITALS — BP 132/54 | HR 78 | Temp 97.3°F | Resp 16 | Ht 64.0 in | Wt 136.8 lb

## 2020-02-13 DIAGNOSIS — C9000 Multiple myeloma not having achieved remission: Secondary | ICD-10-CM | POA: Diagnosis not present

## 2020-02-13 DIAGNOSIS — F411 Generalized anxiety disorder: Secondary | ICD-10-CM | POA: Diagnosis not present

## 2020-02-13 DIAGNOSIS — I10 Essential (primary) hypertension: Secondary | ICD-10-CM | POA: Diagnosis not present

## 2020-02-13 MED ORDER — LISINOPRIL 5 MG PO TABS: 5.0000 mg | ORAL_TABLET | Freq: Every day | ORAL | 0 refills | Status: DC

## 2020-02-13 MED ORDER — ALPRAZOLAM 0.25 MG PO TABS: 0.2500 mg | ORAL_TABLET | Freq: Every evening | ORAL | 1 refills | Status: DC | PRN

## 2020-02-13 NOTE — Progress Notes (Signed)
[facility] [place], [place] [address]  Internal MEDICINE  Office Visit Note  Patient Name: Sherry Oneal  [id number]  [id number]  Date of Service: 02/13/2020  Chief Complaint  Patient presents with  . Follow-up  . Hyperlipidemia  . Hypertension  . Medication Refill    xanax,    HPI  Pt is seen today for follow up on HTN, GAD and HLD. Her bp is well controlled.  She Denies Chest pain, Shortness of breath, palpitations, headache, or blurred vision. Her anxiety is controlled also.  She was given RX for Xanax in 2019, and has finally used all of those, and would like a refill. She denies any other issues at this time. She also has a history of Multiple myeloma, that she is receiving chemo therapy for at this time.    Current Medication: Outpatient Encounter Medications as of 02/13/2020  Medication Sig  . acetaminophen (TYLENOL) 500 MG tablet Take 500 mg by mouth every 4 (four) hours as needed.   Marland Kitchen albuterol (PROVENTIL HFA;VENTOLIN HFA) 108 (90 Base) MCG/ACT inhaler Inhale 2 puffs into the lungs every 6 (six) hours as needed for wheezing or shortness of breath.  . ALPRAZolam (XANAX) 0.25 MG tablet Take 1 tablet (0.25 mg total) by mouth at bedtime as needed.  Marland Kitchen atorvastatin (LIPITOR) 10 MG tablet Take 1 tablet (10 mg total) by mouth daily.  . Calcium Carbonate-Vitamin D 600-400 MG-UNIT tablet Take 1 tablet by mouth daily.  . cholecalciferol (DELTA D3) 10 MCG (400 UNIT) TABS tablet Take 400 Units by mouth daily.  . Cranberry 500 MG CAPS Take 500 mg by mouth every morning.  Marland Kitchen DARATUMUMAB IV Inject into the vein every 30 (thirty) days.  Marland Kitchen dexamethasone (DECADRON) 6 MG tablet Take 12 mg by mouth every 30 (thirty) days.   Marland Kitchen dicyclomine (BENTYL) 10 MG capsule Take 1 capsule (10 mg total) by mouth 3 (three) times daily with meals as needed for spasms.  . diphenhydrAMINE (BENADRYL ALLERGY) 25 MG tablet Take 25 mg by mouth daily as needed.  . docusate (COLACE) 50 MG/5ML  liquid Take by mouth daily as needed for mild constipation.  Marland Kitchen ELIQUIS 5 MG TABS tablet Take 1 tablet by mouth 2 (two) times daily.  . fluticasone (FLONASE) 50 MCG/ACT nasal spray Place into both nostrils daily.  Marland Kitchen ipratropium-albuterol (DUONEB) 0.5-2.5 (3) MG/3ML SOLN Take 3 mLs by nebulization every 6 (six) hours as needed.  Marland Kitchen lisinopril (ZESTRIL) 5 MG tablet Take 1 tablet (5 mg total) by mouth daily.  . meclizine (ANTIVERT) 25 MG tablet Take 25 mg by mouth as needed for dizziness.  . Multiple Vitamins-Minerals (PRESERVISION AREDS PO) Take 1 tablet by mouth every morning.  Marland Kitchen omeprazole (PRILOSEC) 20 MG capsule Take 20 mg by mouth daily.  . ondansetron (ZOFRAN-ODT) 4 MG disintegrating tablet DISSOLVE 1 TABLET IN MOUTH EVERY 8 HOURS AS NEEDED FOR NAUSEA  . polyethylene glycol (MIRALAX / GLYCOLAX) packet Take 17 g by mouth daily as needed.  . pomalidomide (POMALYST) 4 MG capsule Take 1 capsule by mouth once daily for days 1-21. 7 days rest.  . prochlorperazine (COMPAZINE) 5 MG tablet Take 5 mg by mouth every 8 (eight) hours as needed.  . senna (SENOKOT) 8.6 MG tablet Take 1 tablet by mouth daily as needed for constipation.  . valACYclovir (VALTREX) 500 MG tablet Take 500 mg by mouth daily.  . [DISCONTINUED] ALPRAZolam (XANAX) 0.25 MG tablet Take 1 tablet (0.25 mg total) by mouth at bedtime  as needed.  . [DISCONTINUED] lisinopril (ZESTRIL) 5 MG tablet Take 1 tablet (5 mg total) by mouth daily.   No facility-administered encounter medications on file as of 02/13/2020.    Surgical History: Past Surgical History:  Procedure Laterality Date  . CATARACT EXTRACTION W/PHACO Right 08/17/2018   Procedure: CATARACT EXTRACTION PHACO AND INTRAOCULAR LENS PLACEMENT ([place])  RIGHT;  Surgeon: Leandrew Koyanagi, MD;  Location: [place];  Service: Ophthalmology;  Laterality: Right;  . CATARACT EXTRACTION W/PHACO Left 11/09/2018   Procedure: CATARACT EXTRACTION PHACO AND INTRAOCULAR LENS PLACEMENT  ([place]) LEFT;  Surgeon: Leandrew Koyanagi, MD;  Location: [place];  Service: Ophthalmology;  Laterality: Left;  . EXPLORATORY LAPAROTOMY      Medical History: Past Medical History:  Diagnosis Date  . Anemia   . Arthritis    lower back  . High blood pressure   . High cholesterol   . Multiple myeloma ([place])   . Renal insufficiency   . Wears dentures    partial upper    Family History: Family History  Problem Relation Age of Onset  . Breast cancer Maternal Grandmother 55  . CAD Mother   . CVA Mother     Social History   Socioeconomic History  . Marital status: Married    Spouse name: Not on file  . Number of children: 1  . Years of education: Not on file  . Highest education level: Not on file  Occupational History  . Not on file  Tobacco Use  . Smoking status: Never Smoker  . Smokeless tobacco: Never Used  Substance and Sexual Activity  . Alcohol use: No  . Drug use: No  . Sexual activity: Not Currently    Birth control/protection: None  Other Topics Concern  . Not on file  Social History Narrative   Lives at home with her husband, independent at baseline   Social Determinants of Health   Financial Resource Strain:   . Difficulty of Paying Living Expenses: Not on file  Food Insecurity:   . Worried About Charity fundraiser in the Last Year: Not on file  . Ran Out of Food in the Last Year: Not on file  Transportation Needs:   . Lack of Transportation (Medical): Not on file  . Lack of Transportation (Non-Medical): Not on file  Physical Activity:   . Days of Exercise per Week: Not on file  . Minutes of Exercise per Session: Not on file  Stress:   . Feeling of Stress : Not on file  Social Connections:   . Frequency of Communication with Friends and Family: Not on file  . Frequency of Social Gatherings with Friends and Family: Not on file  . Attends Religious Services: Not on file  . Active Member of Clubs or Organizations: Not on file  .  Attends Archivist Meetings: Not on file  . Marital Status: Not on file  Intimate Partner Violence:   . Fear of Current or Ex-Partner: Not on file  . Emotionally Abused: Not on file  . Physically Abused: Not on file  . Sexually Abused: Not on file      Review of Systems  Constitutional: Negative for chills, fatigue and unexpected weight change.  HENT: Negative for congestion, rhinorrhea, sneezing and sore throat.   Eyes: Negative for photophobia, pain and redness.  Respiratory: Negative for cough, chest tightness and shortness of breath.   Cardiovascular: Negative for chest pain and palpitations.  Gastrointestinal: Negative for abdominal pain, constipation,  diarrhea, nausea and vomiting.  Endocrine: Negative.   Genitourinary: Negative for dysuria and frequency.  Musculoskeletal: Negative for arthralgias, back pain, joint swelling and neck pain.  Skin: Negative for rash.  Allergic/Immunologic: Negative.   Neurological: Negative for tremors and numbness.  Hematological: Negative for adenopathy. Does not bruise/bleed easily.  Psychiatric/Behavioral: Negative for behavioral problems and sleep disturbance. The patient is not nervous/anxious.     Vital Signs: BP (!) 132/54   Pulse 78   Temp (!) 97.3 F (36.3 C)   Resp 16   Ht _0  (1.626 m)   Wt 136 lb 12.8 oz (62.1 kg)   SpO2 98%   BMI 23.48 kg/m    Physical Exam Vitals and nursing note reviewed.  Constitutional:      General: She is not in acute distress.    Appearance: She is well-developed. She is not diaphoretic.  HENT:     Head: Normocephalic and atraumatic.     Mouth/Throat:     Pharynx: No oropharyngeal exudate.  Eyes:     Pupils: Pupils are equal, round, and reactive to light.  Neck:     Thyroid: No thyromegaly.     Vascular: No JVD.     Trachea: No tracheal deviation.  Cardiovascular:     Rate and Rhythm: Normal rate and regular rhythm.     Heart sounds: Normal heart sounds. No murmur. No  friction rub. No gallop.   Pulmonary:     Effort: Pulmonary effort is normal. No respiratory distress.     Breath sounds: Normal breath sounds. No wheezing or rales.  Chest:     Chest wall: No tenderness.  Abdominal:     Palpations: Abdomen is soft.     Tenderness: There is no abdominal tenderness. There is no guarding.  Musculoskeletal:        General: Normal range of motion.     Cervical back: Normal range of motion and neck supple.  Lymphadenopathy:     Cervical: No cervical adenopathy.  Skin:    General: Skin is warm and dry.  Neurological:     Mental Status: She is alert and oriented to person, place, and time.     Cranial Nerves: No cranial nerve deficit.  Psychiatric:        Behavior: Behavior normal.        Thought Content: Thought content normal.        Judgment: Judgment normal.    Assessment/Plan: 1. HTN (hypertension), benign Stable, continue to use lisinopril and check bp.  - lisinopril (ZESTRIL) 5 MG tablet; Take 1 tablet (5 mg total) by mouth daily.  Dispense: 90 tablet; Refill: 0  2. Generalized anxiety disorder Reviewed risks and possible side effects associated with taking opiates, benzodiazepines and other CNS depressants. Combination of these could cause dizziness and drowsiness. Advised patient not to drive or operate machinery when taking these medications, as patient's and other's life can be at risk and will have consequences. Patient verbalized understanding in this matter. Dependence and abuse for these drugs will be monitored closely. A Controlled substance policy and procedure is on file which allows [facility] to order a urine drug screen test at any visit. Patient understands and agrees with the plan - ALPRAZolam (XANAX) 0.25 MG tablet; Take 1 tablet (0.25 mg total) by mouth at bedtime as needed.  Dispense: 30 tablet; Refill: 1  3. Multiple myeloma, remission status unspecified ([place]) Continue with chemo as directed.   General Counseling:  Sandy Salaam understanding of  the findings of todays visit and agrees with plan of treatment. I have discussed any further diagnostic evaluation that may be needed or ordered today. We also reviewed her medications today. she has been encouraged to call the office with any questions or concerns that should arise related to todays visit.    No orders of the defined types were placed in this encounter.   Meds ordered this encounter  Medications  . lisinopril (ZESTRIL) 5 MG tablet    Sig: Take 1 tablet (5 mg total) by mouth daily.    Dispense:  90 tablet    Refill:  0  . ALPRAZolam (XANAX) 0.25 MG tablet    Sig: Take 1 tablet (0.25 mg total) by mouth at bedtime as needed.    Dispense:  30 tablet    Refill:  1    Time spent: 25 Minutes   This patient was seen by Orson Gear AGNP-C in Collaboration with Dr Lavera Guise as a part of collaborative care agreement     Kendell Bane AGNP-C Internal medicine

## 2020-03-05 DIAGNOSIS — C9001 Multiple myeloma in remission: Secondary | ICD-10-CM | POA: Diagnosis not present

## 2020-03-05 DIAGNOSIS — Z5112 Encounter for antineoplastic immunotherapy: Secondary | ICD-10-CM | POA: Diagnosis not present

## 2020-03-05 DIAGNOSIS — R0600 Dyspnea, unspecified: Secondary | ICD-10-CM | POA: Diagnosis not present

## 2020-03-05 DIAGNOSIS — C9000 Multiple myeloma not having achieved remission: Secondary | ICD-10-CM | POA: Diagnosis not present

## 2020-03-07 ENCOUNTER — Other Ambulatory Visit: Payer: Self-pay

## 2020-03-07 MED ORDER — ATORVASTATIN CALCIUM 10 MG PO TABS: 10.0000 mg | ORAL_TABLET | Freq: Every day | ORAL | 1 refills | Status: DC

## 2020-03-29 ENCOUNTER — Ambulatory Visit
Admission: RE | Admit: 2020-03-29 | Discharge: 2020-03-29 | Disposition: A | Discharge status: Home / Self Care | Payer: PPO | Source: Ambulatory Visit | Attending: Nurse Practitioner | Admitting: Nurse Practitioner

## 2020-03-29 DIAGNOSIS — Z1231 Encounter for screening mammogram for malignant neoplasm of breast: Secondary | ICD-10-CM | POA: Diagnosis not present

## 2020-04-01 NOTE — Progress Notes (Signed)
Negative mammogram

## 2020-04-02 DIAGNOSIS — Z7951 Long term (current) use of inhaled steroids: Secondary | ICD-10-CM | POA: Diagnosis not present

## 2020-04-02 DIAGNOSIS — Z880 Allergy status to penicillin: Secondary | ICD-10-CM | POA: Diagnosis not present

## 2020-04-02 DIAGNOSIS — Z885 Allergy status to narcotic agent status: Secondary | ICD-10-CM | POA: Diagnosis not present

## 2020-04-02 DIAGNOSIS — M8588 Other specified disorders of bone density and structure, other site: Secondary | ICD-10-CM | POA: Diagnosis not present

## 2020-04-02 DIAGNOSIS — Z9221 Personal history of antineoplastic chemotherapy: Secondary | ICD-10-CM | POA: Diagnosis not present

## 2020-04-02 DIAGNOSIS — R0600 Dyspnea, unspecified: Secondary | ICD-10-CM | POA: Diagnosis not present

## 2020-04-02 DIAGNOSIS — Z91041 Radiographic dye allergy status: Secondary | ICD-10-CM | POA: Diagnosis not present

## 2020-04-02 DIAGNOSIS — C9000 Multiple myeloma not having achieved remission: Secondary | ICD-10-CM | POA: Diagnosis not present

## 2020-04-02 DIAGNOSIS — Z7901 Long term (current) use of anticoagulants: Secondary | ICD-10-CM | POA: Diagnosis not present

## 2020-04-02 DIAGNOSIS — Z882 Allergy status to sulfonamides status: Secondary | ICD-10-CM | POA: Diagnosis not present

## 2020-04-02 DIAGNOSIS — F329 Major depressive disorder, single episode, unspecified: Secondary | ICD-10-CM | POA: Diagnosis not present

## 2020-04-02 DIAGNOSIS — Z79899 Other long term (current) drug therapy: Secondary | ICD-10-CM | POA: Diagnosis not present

## 2020-04-02 DIAGNOSIS — C9001 Multiple myeloma in remission: Secondary | ICD-10-CM | POA: Diagnosis not present

## 2020-04-02 DIAGNOSIS — N281 Cyst of kidney, acquired: Secondary | ICD-10-CM | POA: Diagnosis not present

## 2020-04-02 DIAGNOSIS — E041 Nontoxic single thyroid nodule: Secondary | ICD-10-CM | POA: Diagnosis not present

## 2020-04-02 DIAGNOSIS — Z886 Allergy status to analgesic agent status: Secondary | ICD-10-CM | POA: Diagnosis not present

## 2020-04-02 DIAGNOSIS — N189 Chronic kidney disease, unspecified: Secondary | ICD-10-CM | POA: Diagnosis not present

## 2020-04-02 DIAGNOSIS — R5383 Other fatigue: Secondary | ICD-10-CM | POA: Diagnosis not present

## 2020-04-12 ENCOUNTER — Telehealth: Payer: Self-pay

## 2020-04-12 NOTE — Telephone Encounter (Signed)
Confirmed appointment on 04/15/2020 and screened for covid. klh 

## 2020-04-16 ENCOUNTER — Other Ambulatory Visit: Payer: Self-pay

## 2020-04-16 ENCOUNTER — Encounter: Payer: Self-pay | Admitting: Adult Health

## 2020-04-16 ENCOUNTER — Ambulatory Visit (INDEPENDENT_AMBULATORY_CARE_PROVIDER_SITE_OTHER): Payer: PPO | Admitting: Adult Health

## 2020-04-16 VITALS — BP 130/82 | HR 74 | Temp 97.0°F | Resp 16 | Ht 64.0 in | Wt 136.0 lb

## 2020-04-16 DIAGNOSIS — I10 Essential (primary) hypertension: Secondary | ICD-10-CM

## 2020-04-16 DIAGNOSIS — F411 Generalized anxiety disorder: Secondary | ICD-10-CM

## 2020-04-16 DIAGNOSIS — E782 Mixed hyperlipidemia: Secondary | ICD-10-CM

## 2020-04-16 DIAGNOSIS — C9000 Multiple myeloma not having achieved remission: Secondary | ICD-10-CM

## 2020-04-16 MED ORDER — LISINOPRIL 5 MG PO TABS: 5.0000 mg | ORAL_TABLET | Freq: Every day | ORAL | 1 refills | Status: DC

## 2020-04-16 NOTE — Progress Notes (Signed)
[facility] [place], [place] [address]  Internal MEDICINE  Office Visit Note  Patient Name: Sherry Oneal  [id number]  [id number]  Date of Service: 04/16/2020  Chief Complaint  Patient presents with  . Anemia  . Hypertension  . Hyperlipidemia    HPI Pt is here for follow up on HTN, HLD and anemia.  She reports a few weeks ago she was told by oncology to stop her chemo pills and infusion due to excessive fatigue.  She sees oncology tomorrow for follow up on this.  Her blood pressure is controlled today, 130/82. She does report some fatigue today, but states overall she is feeling better.     Current Medication: Outpatient Encounter Medications as of 04/16/2020  Medication Sig  . acetaminophen (TYLENOL) 500 MG tablet Take 500 mg by mouth every 4 (four) hours as needed.   Marland Kitchen albuterol (PROVENTIL HFA;VENTOLIN HFA) 108 (90 Base) MCG/ACT inhaler Inhale 2 puffs into the lungs every 6 (six) hours as needed for wheezing or shortness of breath.  . ALPRAZolam (XANAX) 0.25 MG tablet Take 1 tablet (0.25 mg total) by mouth at bedtime as needed.  Marland Kitchen atorvastatin (LIPITOR) 10 MG tablet Take 1 tablet (10 mg total) by mouth daily.  . Calcium Carbonate-Vitamin D 600-400 MG-UNIT tablet Take 1 tablet by mouth daily.  . cholecalciferol (DELTA D3) 10 MCG (400 UNIT) TABS tablet Take 400 Units by mouth daily.  . Cranberry 500 MG CAPS Take 500 mg by mouth every morning.  Marland Kitchen DARATUMUMAB IV Inject into the vein every 30 (thirty) days.  Marland Kitchen dexamethasone (DECADRON) 6 MG tablet Take 12 mg by mouth every 30 (thirty) days.   Marland Kitchen dicyclomine (BENTYL) 10 MG capsule Take 1 capsule (10 mg total) by mouth 3 (three) times daily with meals as needed for spasms.  . diphenhydrAMINE (BENADRYL ALLERGY) 25 MG tablet Take 25 mg by mouth daily as needed.  . docusate (COLACE) 50 MG/5ML liquid Take by mouth daily as needed for mild constipation.  Marland Kitchen ELIQUIS 5 MG TABS tablet Take 1 tablet by mouth 2 (two) times  daily.  . fluticasone (FLONASE) 50 MCG/ACT nasal spray Place into both nostrils daily.  Marland Kitchen ipratropium-albuterol (DUONEB) 0.5-2.5 (3) MG/3ML SOLN Take 3 mLs by nebulization every 6 (six) hours as needed.  Marland Kitchen lisinopril (ZESTRIL) 5 MG tablet Take 1 tablet (5 mg total) by mouth daily.  . meclizine (ANTIVERT) 25 MG tablet Take 25 mg by mouth as needed for dizziness.  . Multiple Vitamins-Minerals (PRESERVISION AREDS PO) Take 1 tablet by mouth every morning.  Marland Kitchen omeprazole (PRILOSEC) 20 MG capsule Take 20 mg by mouth daily.  . ondansetron (ZOFRAN-ODT) 4 MG disintegrating tablet DISSOLVE 1 TABLET IN MOUTH EVERY 8 HOURS AS NEEDED FOR NAUSEA  . polyethylene glycol (MIRALAX / GLYCOLAX) packet Take 17 g by mouth daily as needed.  . pomalidomide (POMALYST) 4 MG capsule Take 1 capsule by mouth once daily for days 1-21. 7 days rest.  . prochlorperazine (COMPAZINE) 5 MG tablet Take 5 mg by mouth every 8 (eight) hours as needed.  . senna (SENOKOT) 8.6 MG tablet Take 1 tablet by mouth daily as needed for constipation.  . valACYclovir (VALTREX) 500 MG tablet Take 500 mg by mouth daily.  . [DISCONTINUED] lisinopril (ZESTRIL) 5 MG tablet Take 1 tablet (5 mg total) by mouth daily.   No facility-administered encounter medications on file as of 04/16/2020.    Surgical History: Past Surgical History:  Procedure Laterality Date  . CATARACT  EXTRACTION W/PHACO Right 08/17/2018   Procedure: CATARACT EXTRACTION PHACO AND INTRAOCULAR LENS PLACEMENT ([place])  RIGHT;  Surgeon: Leandrew Koyanagi, MD;  Location: [place];  Service: Ophthalmology;  Laterality: Right;  . CATARACT EXTRACTION W/PHACO Left 11/09/2018   Procedure: CATARACT EXTRACTION PHACO AND INTRAOCULAR LENS PLACEMENT ([place]) LEFT;  Surgeon: Leandrew Koyanagi, MD;  Location: [place];  Service: Ophthalmology;  Laterality: Left;  . EXPLORATORY LAPAROTOMY      Medical History: Past Medical History:  Diagnosis Date  . Anemia   . Arthritis     lower back  . High blood pressure   . High cholesterol   . Multiple myeloma ([place])   . Renal insufficiency   . Wears dentures    partial upper    Family History: Family History  Problem Relation Age of Onset  . Breast cancer Maternal Grandmother 32  . CAD Mother   . CVA Mother     Social History   Socioeconomic History  . Marital status: Married    Spouse name: Not on file  . Number of children: 1  . Years of education: Not on file  . Highest education level: Not on file  Occupational History  . Not on file  Tobacco Use  . Smoking status: Never Smoker  . Smokeless tobacco: Never Used  Substance and Sexual Activity  . Alcohol use: No  . Drug use: No  . Sexual activity: Not Currently    Birth control/protection: None  Other Topics Concern  . Not on file  Social History Narrative   Lives at home with her husband, independent at baseline   Social Determinants of Health   Financial Resource Strain:   . Difficulty of Paying Living Expenses:   Food Insecurity:   . Worried About Charity fundraiser in the Last Year:   . Arboriculturist in the Last Year:   Transportation Needs:   . Film/video editor (Medical):   Marland Kitchen Lack of Transportation (Non-Medical):   Physical Activity:   . Days of Exercise per Week:   . Minutes of Exercise per Session:   Stress:   . Feeling of Stress :   Social Connections:   . Frequency of Communication with Friends and Family:   . Frequency of Social Gatherings with Friends and Family:   . Attends Religious Services:   . Active Member of Clubs or Organizations:   . Attends Archivist Meetings:   Marland Kitchen Marital Status:   Intimate Partner Violence:   . Fear of Current or Ex-Partner:   . Emotionally Abused:   Marland Kitchen Physically Abused:   . Sexually Abused:       Review of Systems  Constitutional: Negative for chills, fatigue and unexpected weight change.  HENT: Negative for congestion, rhinorrhea, sneezing and sore throat.    Eyes: Negative for photophobia, pain and redness.  Respiratory: Negative for cough, chest tightness and shortness of breath.   Cardiovascular: Negative for chest pain and palpitations.  Gastrointestinal: Negative for abdominal pain, constipation, diarrhea, nausea and vomiting.  Endocrine: Negative.   Genitourinary: Negative for dysuria and frequency.  Musculoskeletal: Negative for arthralgias, back pain, joint swelling and neck pain.  Skin: Negative for rash.  Allergic/Immunologic: Negative.   Neurological: Negative for tremors and numbness.  Hematological: Negative for adenopathy. Does not bruise/bleed easily.  Psychiatric/Behavioral: Negative for behavioral problems and sleep disturbance. The patient is not nervous/anxious.     Vital Signs: BP 130/82   Pulse 74   Temp Marland Kitchen)  97 F (36.1 C)   Resp 16   Ht 5' 4"  (1.626 m)   Wt 136 lb (61.7 kg)   SpO2 99%   BMI 23.34 kg/m    Physical Exam Vitals and nursing note reviewed.  Constitutional:      General: She is not in acute distress.    Appearance: She is well-developed. She is not diaphoretic.  HENT:     Head: Normocephalic and atraumatic.     Mouth/Throat:     Pharynx: No oropharyngeal exudate.  Eyes:     Pupils: Pupils are equal, round, and reactive to light.  Neck:     Thyroid: No thyromegaly.     Vascular: No JVD.     Trachea: No tracheal deviation.  Cardiovascular:     Rate and Rhythm: Normal rate and regular rhythm.     Heart sounds: Normal heart sounds. No murmur. No friction rub. No gallop.   Pulmonary:     Effort: Pulmonary effort is normal. No respiratory distress.     Breath sounds: Normal breath sounds. No wheezing or rales.  Chest:     Chest wall: No tenderness.  Abdominal:     Palpations: Abdomen is soft.     Tenderness: There is no abdominal tenderness. There is no guarding.  Musculoskeletal:        General: Normal range of motion.     Cervical back: Normal range of motion and neck supple.   Lymphadenopathy:     Cervical: No cervical adenopathy.  Skin:    General: Skin is warm and dry.  Neurological:     Mental Status: She is alert and oriented to person, place, and time.     Cranial Nerves: No cranial nerve deficit.  Psychiatric:        Behavior: Behavior normal.        Thought Content: Thought content normal.        Judgment: Judgment normal.    Assessment/Plan: 1. HTN (hypertension), benign Controlled, continue present management.   2. Generalized anxiety disorder Doing well, denies any new issues.  Continue to use Xanax  3. Multiple myeloma, remission status unspecified ([place]) Follow up with oncology as scheduled.  4. Mixed hyperlipidemia Continue Lipitor as directed.   General Counseling: etheline geppert understanding of the findings of todays visit and agrees with plan of treatment. I have discussed any further diagnostic evaluation that may be needed or ordered today. We also reviewed her medications today. she has been encouraged to call the office with any questions or concerns that should arise related to todays visit.    No orders of the defined types were placed in this encounter.   Meds ordered this encounter  Medications  . lisinopril (ZESTRIL) 5 MG tablet    Sig: Take 1 tablet (5 mg total) by mouth daily.    Dispense:  90 tablet    Refill:  1    Hold RX until patient calls.    Time spent: 30 Minutes   This patient was seen by Orson Gear AGNP-C in Collaboration with Dr Lavera Guise as a part of collaborative care agreement     Kendell Bane AGNP-C Internal medicine

## 2020-04-17 DIAGNOSIS — F411 Generalized anxiety disorder: Secondary | ICD-10-CM | POA: Diagnosis not present

## 2020-04-17 DIAGNOSIS — R5383 Other fatigue: Secondary | ICD-10-CM | POA: Diagnosis not present

## 2020-04-17 DIAGNOSIS — C9001 Multiple myeloma in remission: Secondary | ICD-10-CM | POA: Diagnosis not present

## 2020-04-17 DIAGNOSIS — D61810 Antineoplastic chemotherapy induced pancytopenia: Secondary | ICD-10-CM | POA: Diagnosis not present

## 2020-04-20 ENCOUNTER — Emergency Department
Admission: EM | Admit: 2020-04-20 | Discharge: 2020-04-20 | Disposition: A | Discharge status: Home / Self Care | Payer: PPO | Attending: Emergency Medicine | Admitting: Emergency Medicine

## 2020-04-20 ENCOUNTER — Emergency Department: Payer: PPO

## 2020-04-20 ENCOUNTER — Encounter: Payer: Self-pay | Admitting: Emergency Medicine

## 2020-04-20 ENCOUNTER — Other Ambulatory Visit: Payer: Self-pay

## 2020-04-20 DIAGNOSIS — R112 Nausea with vomiting, unspecified: Secondary | ICD-10-CM | POA: Diagnosis not present

## 2020-04-20 DIAGNOSIS — D801 Nonfamilial hypogammaglobulinemia: Secondary | ICD-10-CM | POA: Diagnosis not present

## 2020-04-20 DIAGNOSIS — N189 Chronic kidney disease, unspecified: Secondary | ICD-10-CM | POA: Diagnosis not present

## 2020-04-20 DIAGNOSIS — A0811 Acute gastroenteropathy due to Norwalk agent: Secondary | ICD-10-CM | POA: Insufficient documentation

## 2020-04-20 DIAGNOSIS — Z7901 Long term (current) use of anticoagulants: Secondary | ICD-10-CM | POA: Insufficient documentation

## 2020-04-20 DIAGNOSIS — K5730 Diverticulosis of large intestine without perforation or abscess without bleeding: Secondary | ICD-10-CM | POA: Diagnosis not present

## 2020-04-20 DIAGNOSIS — R1084 Generalized abdominal pain: Secondary | ICD-10-CM | POA: Diagnosis not present

## 2020-04-20 DIAGNOSIS — Z79899 Other long term (current) drug therapy: Secondary | ICD-10-CM | POA: Insufficient documentation

## 2020-04-20 DIAGNOSIS — R197 Diarrhea, unspecified: Secondary | ICD-10-CM | POA: Diagnosis not present

## 2020-04-20 DIAGNOSIS — R109 Unspecified abdominal pain: Secondary | ICD-10-CM | POA: Diagnosis present

## 2020-04-20 LAB — GASTROINTESTINAL PANEL BY PCR, STOOL (REPLACES STOOL CULTURE)

## 2020-04-20 LAB — COMPREHENSIVE METABOLIC PANEL
ALT: 19 U/L (ref 0–44)
AST: 27 U/L (ref 15–41)
Albumin: 4.2 g/dL (ref 3.5–5.0)
Alkaline Phosphatase: 57 U/L (ref 38–126)
Anion gap: 13 (ref 5–15)
BUN: 18 mg/dL (ref 8–23)
CO2: 20 mmol/L — ABNORMAL LOW (ref 22–32)
Calcium: 9.2 mg/dL (ref 8.9–10.3)
Chloride: 105 mmol/L (ref 98–111)
Creatinine, Ser: 1.09 mg/dL — ABNORMAL HIGH (ref 0.44–1.00)
GFR calc Af Amer: 58 mL/min — ABNORMAL LOW (ref >60)
GFR calc non Af Amer: 50 mL/min — ABNORMAL LOW (ref >60)
Glucose, Bld: 179 mg/dL — ABNORMAL HIGH (ref 70–99)
Potassium: 3.7 mmol/L (ref 3.5–5.1)
Sodium: 138 mmol/L (ref 135–145)
Total Bilirubin: 0.9 mg/dL (ref 0.3–1.2)
Total Protein: 6.6 g/dL (ref 6.5–8.1)

## 2020-04-20 LAB — CBC
HCT: 42 % (ref 36.0–46.0)
Hemoglobin: 13.9 g/dL (ref 12.0–15.0)
MCH: 30 pg (ref 26.0–34.0)
MCHC: 33.1 g/dL (ref 30.0–36.0)
MCV: 90.5 fL (ref 80.0–100.0)
Platelets: 226 10*3/uL (ref 150–400)
RBC: 4.64 MIL/uL (ref 3.87–5.11)
RDW: 13.8 % (ref 11.5–15.5)
WBC: 15.4 10*3/uL — ABNORMAL HIGH (ref 4.0–10.5)
nRBC: 0 % (ref 0.0–0.2)

## 2020-04-20 LAB — C DIFFICILE QUICK SCREEN W PCR REFLEX
C Diff antigen: NEGATIVE
C Diff interpretation: NOT DETECTED
C Diff toxin: NEGATIVE

## 2020-04-20 LAB — LIPASE, BLOOD: Lipase: 31 U/L (ref 11–51)

## 2020-04-20 MED ORDER — ACETAMINOPHEN 500 MG PO TABS: 1000.0000 mg | ORAL_TABLET | Freq: Once | ORAL | Status: AC

## 2020-04-20 MED ORDER — PROMETHAZINE HCL 25 MG/ML IJ SOLN
12.5000 mg | Freq: Once | INTRAMUSCULAR | Status: AC
  Administered 2020-04-20: 12.5 mg via INTRAVENOUS
  Filled 2020-04-20: qty 1

## 2020-04-20 MED ORDER — ONDANSETRON HCL 4 MG/2ML IJ SOLN: 4.0000 mg | Freq: Once | INTRAMUSCULAR | Status: DC

## 2020-04-20 MED ORDER — ACETAMINOPHEN 500 MG PO TABS
ORAL_TABLET | ORAL | Status: AC
  Administered 2020-04-20: 1000 mg via ORAL
  Filled 2020-04-20: qty 2

## 2020-04-20 MED ORDER — FENTANYL CITRATE (PF) 100 MCG/2ML IJ SOLN
50.0000 ug | Freq: Once | INTRAMUSCULAR | Status: AC
  Administered 2020-04-20: 50 ug via INTRAVENOUS
  Filled 2020-04-20: qty 2

## 2020-04-20 MED ORDER — METOCLOPRAMIDE HCL 5 MG/ML IJ SOLN
10.0000 mg | Freq: Once | INTRAMUSCULAR | Status: AC
  Administered 2020-04-20: 10 mg via INTRAVENOUS

## 2020-04-20 MED ORDER — PROMETHAZINE HCL 25 MG PO TABS
25.0000 mg | ORAL_TABLET | Freq: Four times a day (QID) | ORAL | 0 refills | Status: DC | PRN
Start: 2020-04-20 — End: 2024-07-25

## 2020-04-20 MED ORDER — SODIUM CHLORIDE 0.9 % IV BOLUS
1000.0000 mL | Freq: Once | INTRAVENOUS | Status: AC
  Administered 2020-04-20: 1000 mL via INTRAVENOUS

## 2020-04-20 NOTE — ED Notes (Signed)
Pt has had 2 runny loose stools in this RN's presence

## 2020-04-20 NOTE — ED Notes (Signed)
Patient tolerating PO fluids with no issue. MD informed.

## 2020-04-20 NOTE — ED Provider Notes (Signed)
[facility] Emergency Department Provider Note  Time seen: 3:50 AM  I have reviewed the triage vital signs and the nursing notes.   HISTORY  Chief Complaint Abdominal Cramping, Nausea, and Diarrhea   HPI Sherry Oneal is a 74 y.o. female with a past medical history of anemia, arthritis, hyperlipidemia, multiple myeloma, CKD, presents to the emergency department for nausea vomiting diarrhea and abdominal pain.  According to the patient beginning approximately 6 or 7 hours ago she developed nausea vomiting diarrhea abdominal pain which she states is fairly diffuse.  Denies any known fever.  No chest pain cough or shortness of breath.  Patient states this is happened previously with colitis.  No dysuria.  Moderate aching pain.   Past Medical History:  Diagnosis Date  . Anemia   . Arthritis    lower back  . High blood pressure   . High cholesterol   . Multiple myeloma ([place])   . Renal insufficiency   . Wears dentures    partial upper    Patient Active Problem List   Diagnosis Date Noted  . Encounter for general adult medical examination with abnormal findings 11/20/2019  . Acute colitis 08/03/2019  . Screening for breast cancer 11/17/2018  . Dysuria 11/17/2018  . Acute pharyngitis 08/26/2018  . Sore throat 08/26/2018  . Generalized anxiety disorder 06/13/2018  . HCAP (healthcare-associated pneumonia) 03/22/2018  . Acute bronchitis 03/01/2018  . Cough 03/01/2018  . Flu-like symptoms 03/01/2018  . High blood pressure 03/01/2018  . Hypogammaglobulinemia, acquired ([place]) 01/11/2018  . Exertional dyspnea 09/03/2017  . Pancytopenia due to chemotherapy ([place]) 05/18/2017  . LGI bleed 01/30/2017  . Acute blood loss anemia 01/30/2017  . Abdominal pain 01/30/2017  . Multiple myeloma ([place]) 01/30/2017  . Hyperlipidemia 10/29/2016  . Nephrolithiasis 04/04/2013  . Uncertain tumor of kidney and ureter 04/04/2013    Past Surgical History:  Procedure Laterality  Date  . CATARACT EXTRACTION W/PHACO Right 08/17/2018   Procedure: CATARACT EXTRACTION PHACO AND INTRAOCULAR LENS PLACEMENT ([place])  RIGHT;  Surgeon: Leandrew Koyanagi, MD;  Location: [place];  Service: Ophthalmology;  Laterality: Right;  . CATARACT EXTRACTION W/PHACO Left 11/09/2018   Procedure: CATARACT EXTRACTION PHACO AND INTRAOCULAR LENS PLACEMENT ([place]) LEFT;  Surgeon: Leandrew Koyanagi, MD;  Location: [place];  Service: Ophthalmology;  Laterality: Left;  . EXPLORATORY LAPAROTOMY      Prior to Admission medications   Medication Sig Start Date End Date Taking? Authorizing Provider  acetaminophen (TYLENOL) 500 MG tablet Take 500 mg by mouth every 4 (four) hours as needed.     [provider]  albuterol (PROVENTIL HFA;VENTOLIN HFA) 108 (90 Base) MCG/ACT inhaler Inhale 2 puffs into the lungs every 6 (six) hours as needed for wheezing or shortness of breath. 03/24/18   Hillary Bow, MD  ALPRAZolam Duanne Moron) 0.25 MG tablet Take 1 tablet (0.25 mg total) by mouth at bedtime as needed. 02/13/20   Kendell Bane, NP  atorvastatin (LIPITOR) 10 MG tablet Take 1 tablet (10 mg total) by mouth daily. 03/07/20   Kendell Bane, NP  Calcium Carbonate-Vitamin D 600-400 MG-UNIT tablet Take 1 tablet by mouth daily.    [provider]  cholecalciferol (DELTA D3) 10 MCG (400 UNIT) TABS tablet Take 400 Units by mouth daily.    [provider]  Cranberry 500 MG CAPS Take 500 mg by mouth every morning.    [provider]  DARATUMUMAB IV Inject into the vein every 30 (thirty) days.  [provider]  dexamethasone (DECADRON) 6 MG tablet Take 12 mg by mouth every 30 (thirty) days.     [provider]  dicyclomine (BENTYL) 10 MG capsule Take 1 capsule (10 mg total) by mouth 3 (three) times daily with meals as needed for spasms. 12/14/19   Kendell Bane, NP  diphenhydrAMINE (BENADRYL ALLERGY) 25 MG tablet Take 25 mg by mouth daily as  needed.    [provider]  docusate (COLACE) 50 MG/5ML liquid Take by mouth daily as needed for mild constipation.    [provider]  ELIQUIS 5 MG TABS tablet Take 1 tablet by mouth 2 (two) times daily. 02/21/18   [provider]  fluticasone (FLONASE) 50 MCG/ACT nasal spray Place into both nostrils daily.    [provider]  ipratropium-albuterol (DUONEB) 0.5-2.5 (3) MG/3ML SOLN Take 3 mLs by nebulization every 6 (six) hours as needed. 03/14/19   Kendell Bane, NP  lisinopril (ZESTRIL) 5 MG tablet Take 1 tablet (5 mg total) by mouth daily. 04/16/20   Kendell Bane, NP  meclizine (ANTIVERT) 25 MG tablet Take 25 mg by mouth as needed for dizziness.    [provider]  Multiple Vitamins-Minerals (PRESERVISION AREDS PO) Take 1 tablet by mouth every morning.    [provider]  omeprazole (PRILOSEC) 20 MG capsule Take 20 mg by mouth daily. 01/20/17   [provider]  ondansetron (ZOFRAN-ODT) 4 MG disintegrating tablet DISSOLVE 1 TABLET IN MOUTH EVERY 8 HOURS AS NEEDED FOR NAUSEA 12/20/17   [provider]  polyethylene glycol (MIRALAX / GLYCOLAX) packet Take 17 g by mouth daily as needed.    [provider]  pomalidomide (POMALYST) 4 MG capsule Take 1 capsule by mouth once daily for days 1-21. 7 days rest. 02/16/18   [provider]  prochlorperazine (COMPAZINE) 5 MG tablet Take 5 mg by mouth every 8 (eight) hours as needed. 12/04/16   [provider]  senna (SENOKOT) 8.6 MG tablet Take 1 tablet by mouth daily as needed for constipation.    [provider]  valACYclovir (VALTREX) 500 MG tablet Take 500 mg by mouth daily. 01/20/17   [provider]    Allergies  Allergen Reactions  . Aspirin Other (See Comments)    Causes internal bleeding per patient  . Atenolol     "wierd feeling"  . Doxycycline     Upset stomach   . Epinephrine     "Shaking"  . Ivp Dye [Iodinated Diagnostic  Agents] Hypertension    Per tech conversation with PT, pt states that the last time she had IV dye she had increased BP and tachycardia.     Marland Kitchen Ketorolac   . Nsaids     Bleeding  . Penicillins     rash  . Sulfa Antibiotics     Rash   . Tolmetin     Bleeding  . Tramadol     Numb feeling   . Procaine Palpitations    States this was added due to palpitations and shakes after receiving local anesthetic containing epinephrine.     Family History  Problem Relation Age of Onset  . Breast cancer Maternal Grandmother 80  . CAD Mother   . CVA Mother     Social History Social History   Tobacco Use  . Smoking status: Never Smoker  . Smokeless tobacco: Never Used  Substance Use Topics  . Alcohol use: No  . Drug use: No  Review of Systems Constitutional: Negative for fever. Cardiovascular: Negative for chest pain. Respiratory: Negative for shortness of breath. Gastrointestinal: Moderate aching abdominal pain/cramping.  Positive for nausea vomiting diarrhea. Genitourinary: Negative for urinary compaints Musculoskeletal: Negative for musculoskeletal complaints Neurological: Negative for headache All other ROS negative  ____________________________________________   PHYSICAL EXAM:  Constitutional: Alert and oriented. Well appearing and in no distress. Eyes: Normal exam ENT      Head: Normocephalic and atraumatic.      Mouth/Throat: Mucous membranes are moist. Cardiovascular: Normal rate, regular rhythm. Respiratory: Normal respiratory effort without tachypnea nor retractions. Breath sounds are clear  Gastrointestinal: Soft, mild tenderness to palpation diffusely without any focal area of tenderness identified.  No rebound guarding or distention. Musculoskeletal: Nontender with normal range of motion in all extremities.  Neurologic:  Normal speech and language. No gross focal neurologic deficits  Psychiatric: Mood and affect are normal. Speech and behavior are normal.    ____________________________________________    RADIOLOGY  CT negative for acute abnormality.  ____________________________________________   INITIAL IMPRESSION / ASSESSMENT AND PLAN / ED COURSE  Pertinent labs & imaging results that were available during my care of the patient were reviewed by me and considered in my medical decision making (see chart for details).   Patient presents to the emergency department for abdominal pain nausea vomiting diarrhea over the past 6 hours.  Differential would include gastroenteritis, gastritis, colitis, other intra-abdominal pathology, electrolyte or metabolic abnormality, dehydration.  We will IV hydrate, treat with nausea medication and pain medication.  We will check labs and obtain CT imaging.  We will send stool studies as well.  Patient's work-up is essentially negative.  CT negative for acute abnormality.  Patient receiving IV fluids.  Patient states she is feeling somewhat better wishes to try to orally hydrate.  Patient's GI panel has resulted positive for norovirus this likely explains the patient's symptoms and presentation.  We will discharge with supportive care.  Patient and husband agreeable to plan of care.  Discussed handwashing and not sharing a bathroom as well as return precautions.  Patient has been able to tolerate oral hydration.  Keeping down Tylenol.  We will discharged with nausea medication.  LENNOX LEIKAM was evaluated in Emergency Department on 04/20/2020 for the symptoms described in the history of present illness. She was evaluated in the context of the global COVID-19 pandemic, which necessitated consideration that the patient might be at risk for infection with the SARS-CoV-2 virus that causes COVID-19. Institutional protocols and algorithms that pertain to the evaluation of patients at risk for COVID-19 are in a state of rapid change based on information released by regulatory bodies including the CDC and federal and  state organizations. These policies and algorithms were followed during the patient's care in the ED.  ____________________________________________   FINAL CLINICAL IMPRESSION(S) / ED DIAGNOSES  Abdominal pain Nausea vomiting diarrhea Norovirus   Harvest Dark, MD 04/20/20 [phone]

## 2020-04-20 NOTE — ED Notes (Signed)
Water given per MD Paduchowski. Will continue to monitor.

## 2020-04-20 NOTE — ED Triage Notes (Addendum)
Pt from home to ED via [place] for N/V/D & abd pain. Pt st she has had these sx for the last 6 hrs. Pt st no one else in her home has these same sx. Pt reports having diarrhea x4 and emesis x6 but is unable to describe the characteristic of said diarrhea/emesis  Pt being treated for multiple myeloma  Since 2016 and has stopped tx as of 3 weeks ago b/c she couldn't tolerate it

## 2020-04-20 NOTE — ED Notes (Signed)
Patient had 1 small emesis and episode of fecal incontinence. Patient cleaned, placed in brief. MD informed.

## 2020-04-26 DIAGNOSIS — F4320 Adjustment disorder, unspecified: Secondary | ICD-10-CM | POA: Diagnosis not present

## 2020-04-26 DIAGNOSIS — C9001 Multiple myeloma in remission: Secondary | ICD-10-CM | POA: Diagnosis not present

## 2020-05-01 ENCOUNTER — Telehealth: Payer: Self-pay

## 2020-05-01 NOTE — Telephone Encounter (Signed)
Called lmom informing patient of appointment on 05/03/2020. klh

## 2020-05-03 ENCOUNTER — Ambulatory Visit (INDEPENDENT_AMBULATORY_CARE_PROVIDER_SITE_OTHER): Payer: PPO | Admitting: Adult Health

## 2020-05-03 ENCOUNTER — Encounter: Payer: Self-pay | Admitting: Adult Health

## 2020-05-03 ENCOUNTER — Other Ambulatory Visit: Payer: Self-pay

## 2020-05-03 VITALS — BP 138/78 | HR 72 | Temp 96.7°F | Resp 16 | Ht 64.0 in | Wt 134.2 lb

## 2020-05-03 DIAGNOSIS — I10 Essential (primary) hypertension: Secondary | ICD-10-CM

## 2020-05-03 DIAGNOSIS — F411 Generalized anxiety disorder: Secondary | ICD-10-CM | POA: Diagnosis not present

## 2020-05-03 DIAGNOSIS — E538 Deficiency of other specified B group vitamins: Secondary | ICD-10-CM | POA: Diagnosis not present

## 2020-05-03 DIAGNOSIS — C9000 Multiple myeloma not having achieved remission: Secondary | ICD-10-CM | POA: Diagnosis not present

## 2020-05-03 DIAGNOSIS — E782 Mixed hyperlipidemia: Secondary | ICD-10-CM

## 2020-05-03 MED ORDER — CYANOCOBALAMIN 1000 MCG/ML IJ SOLN
1000.0000 ug | Freq: Once | INTRAMUSCULAR | Status: AC
  Administered 2020-05-03: 1000 ug via INTRAMUSCULAR

## 2020-05-03 NOTE — Progress Notes (Signed)
[facility] [place], [place] [address]  Internal MEDICINE  Office Visit Note  Patient Name: Sherry Oneal  [id number]  [id number]  Date of Service: 05/03/2020  Chief Complaint  Patient presents with  . Hospitalization Follow-up  . Hyperlipidemia  . Hypertension    HPI  PT is here for follow up on HLD, HTN, multiple myeloma, and b12 deficiency.  Pt reports overall she is doing well.  She had Noro virus on 4/24 and was seen in the ER.  She has recovered nicely.  She denies any complaints today.  Her blood pressure is controlled.  Denies Chest pain, Shortness of breath, palpitations, headache, or blurred vision. She will get B12 injections for the next 8 weeks, and will restart her chemo next week per oncology for the multiple myeloma..      Current Medication: Outpatient Encounter Medications as of 05/03/2020  Medication Sig  . acetaminophen (TYLENOL) 500 MG tablet Take 500 mg by mouth every 4 (four) hours as needed.   Marland Kitchen albuterol (PROVENTIL HFA;VENTOLIN HFA) 108 (90 Base) MCG/ACT inhaler Inhale 2 puffs into the lungs every 6 (six) hours as needed for wheezing or shortness of breath.  . ALPRAZolam (XANAX) 0.25 MG tablet Take 1 tablet (0.25 mg total) by mouth at bedtime as needed.  Marland Kitchen atorvastatin (LIPITOR) 10 MG tablet Take 1 tablet (10 mg total) by mouth daily.  . Calcium Carbonate-Vitamin D 600-400 MG-UNIT tablet Take 1 tablet by mouth daily.  . cholecalciferol (DELTA D3) 10 MCG (400 UNIT) TABS tablet Take 400 Units by mouth daily.  . Cranberry 500 MG CAPS Take 500 mg by mouth every morning.  Marland Kitchen DARATUMUMAB IV Inject into the vein every 30 (thirty) days.  Marland Kitchen dexamethasone (DECADRON) 6 MG tablet Take 12 mg by mouth every 30 (thirty) days.   Marland Kitchen dicyclomine (BENTYL) 10 MG capsule Take 1 capsule (10 mg total) by mouth 3 (three) times daily with meals as needed for spasms.  . diphenhydrAMINE (BENADRYL ALLERGY) 25 MG tablet Take 25 mg by mouth daily as needed.  .  docusate (COLACE) 50 MG/5ML liquid Take by mouth daily as needed for mild constipation.  Marland Kitchen ELIQUIS 5 MG TABS tablet Take 1 tablet by mouth 2 (two) times daily.  . fluticasone (FLONASE) 50 MCG/ACT nasal spray Place into both nostrils daily.  Marland Kitchen ipratropium-albuterol (DUONEB) 0.5-2.5 (3) MG/3ML SOLN Take 3 mLs by nebulization every 6 (six) hours as needed.  Marland Kitchen lisinopril (ZESTRIL) 5 MG tablet Take 1 tablet (5 mg total) by mouth daily.  . meclizine (ANTIVERT) 25 MG tablet Take 25 mg by mouth as needed for dizziness.  . Multiple Vitamins-Minerals (PRESERVISION AREDS PO) Take 1 tablet by mouth every morning.  Marland Kitchen omeprazole (PRILOSEC) 20 MG capsule Take 20 mg by mouth daily.  . ondansetron (ZOFRAN-ODT) 4 MG disintegrating tablet DISSOLVE 1 TABLET IN MOUTH EVERY 8 HOURS AS NEEDED FOR NAUSEA  . polyethylene glycol (MIRALAX / GLYCOLAX) packet Take 17 g by mouth daily as needed.  . pomalidomide (POMALYST) 4 MG capsule Take 1 capsule by mouth once daily for days 1-21. 7 days rest.  . prochlorperazine (COMPAZINE) 5 MG tablet Take 5 mg by mouth every 8 (eight) hours as needed.  . promethazine (PHENERGAN) 25 MG tablet Take 1 tablet (25 mg total) by mouth every 6 (six) hours as needed for nausea or vomiting.  . senna (SENOKOT) 8.6 MG tablet Take 1 tablet by mouth daily as needed for constipation.  . valACYclovir (VALTREX) 500 MG  tablet Take 500 mg by mouth daily.   Facility-Administered Encounter Medications as of 05/03/2020  Medication  . cyanocobalamin ((VITAMIN B-12)) injection 1,000 mcg    Surgical History: Past Surgical History:  Procedure Laterality Date  . CATARACT EXTRACTION W/PHACO Right 08/17/2018   Procedure: CATARACT EXTRACTION PHACO AND INTRAOCULAR LENS PLACEMENT ([place])  RIGHT;  Surgeon: Leandrew Koyanagi, MD;  Location: [place];  Service: Ophthalmology;  Laterality: Right;  . CATARACT EXTRACTION W/PHACO Left 11/09/2018   Procedure: CATARACT EXTRACTION PHACO AND INTRAOCULAR LENS  PLACEMENT ([place]) LEFT;  Surgeon: Leandrew Koyanagi, MD;  Location: [place];  Service: Ophthalmology;  Laterality: Left;  . EXPLORATORY LAPAROTOMY      Medical History: Past Medical History:  Diagnosis Date  . Anemia   . Arthritis    lower back  . High blood pressure   . High cholesterol   . Multiple myeloma ([place])   . Renal insufficiency   . Wears dentures    partial upper    Family History: Family History  Problem Relation Age of Onset  . Breast cancer Maternal Grandmother 16  . CAD Mother   . CVA Mother     Social History   Socioeconomic History  . Marital status: Married    Spouse name: Not on file  . Number of children: 1  . Years of education: Not on file  . Highest education level: Not on file  Occupational History  . Not on file  Tobacco Use  . Smoking status: Never Smoker  . Smokeless tobacco: Never Used  Substance and Sexual Activity  . Alcohol use: No  . Drug use: No  . Sexual activity: Not Currently    Birth control/protection: None  Other Topics Concern  . Not on file  Social History Narrative   Lives at home with her husband, independent at baseline   Social Determinants of Health   Financial Resource Strain:   . Difficulty of Paying Living Expenses:   Food Insecurity:   . Worried About Charity fundraiser in the Last Year:   . Arboriculturist in the Last Year:   Transportation Needs:   . Film/video editor (Medical):   Marland Kitchen Lack of Transportation (Non-Medical):   Physical Activity:   . Days of Exercise per Week:   . Minutes of Exercise per Session:   Stress:   . Feeling of Stress :   Social Connections:   . Frequency of Communication with Friends and Family:   . Frequency of Social Gatherings with Friends and Family:   . Attends Religious Services:   . Active Member of Clubs or Organizations:   . Attends Archivist Meetings:   Marland Kitchen Marital Status:   Intimate Partner Violence:   . Fear of Current or Ex-Partner:    . Emotionally Abused:   Marland Kitchen Physically Abused:   . Sexually Abused:       Review of Systems  Constitutional: Negative for chills, fatigue and unexpected weight change.  HENT: Negative for congestion, rhinorrhea, sneezing and sore throat.   Eyes: Negative for photophobia, pain and redness.  Respiratory: Negative for cough, chest tightness and shortness of breath.   Cardiovascular: Negative for chest pain and palpitations.  Gastrointestinal: Negative for abdominal pain, constipation, diarrhea, nausea and vomiting.  Endocrine: Negative.   Genitourinary: Negative for dysuria and frequency.  Musculoskeletal: Negative for arthralgias, back pain, joint swelling and neck pain.  Skin: Negative for rash.  Allergic/Immunologic: Negative.   Neurological: Negative for tremors  and numbness.  Hematological: Negative for adenopathy. Does not bruise/bleed easily.  Psychiatric/Behavioral: Negative for behavioral problems and sleep disturbance. The patient is not nervous/anxious.     Vital Signs: BP (!) 131/57   Pulse 72   Temp (!) 96.7 F (35.9 C)   Resp 16   Ht _0  (1.626 m)   Wt 134 lb 3.2 oz (60.9 kg)   SpO2 98%   BMI 23.04 kg/m    Physical Exam Vitals and nursing note reviewed.  Constitutional:      General: She is not in acute distress.    Appearance: She is well-developed. She is not diaphoretic.  HENT:     Head: Normocephalic and atraumatic.     Mouth/Throat:     Pharynx: No oropharyngeal exudate.  Eyes:     Pupils: Pupils are equal, round, and reactive to light.  Neck:     Thyroid: No thyromegaly.     Vascular: No JVD.     Trachea: No tracheal deviation.  Cardiovascular:     Rate and Rhythm: Normal rate and regular rhythm.     Heart sounds: Normal heart sounds. No murmur. No friction rub. No gallop.   Pulmonary:     Effort: Pulmonary effort is normal. No respiratory distress.     Breath sounds: Normal breath sounds. No wheezing or rales.  Chest:     Chest wall: No  tenderness.  Abdominal:     Palpations: Abdomen is soft.     Tenderness: There is no abdominal tenderness. There is no guarding.  Musculoskeletal:        General: Normal range of motion.     Cervical back: Normal range of motion and neck supple.  Lymphadenopathy:     Cervical: No cervical adenopathy.  Skin:    General: Skin is warm and dry.  Neurological:     Mental Status: She is alert and oriented to person, place, and time.     Cranial Nerves: No cranial nerve deficit.  Psychiatric:        Behavior: Behavior normal.        Thought Content: Thought content normal.        Judgment: Judgment normal.    Assessment/Plan: 1. HTN (hypertension), benign Stable, Continue current management.   2. Generalized anxiety disorder Stable, continue current therapy.   3. Multiple myeloma, remission status unspecified ([place]) See oncology next week as scheduled.   4. Mixed hyperlipidemia Continue Lipitor for HLD.   5. B12 deficiency Continue B12 injections for 8 weeks.  - cyanocobalamin ((VITAMIN B-12)) injection 1,000 mcg  General Counseling: Julieanna verbalizes understanding of the findings of todays visit and agrees with plan of treatment. I have discussed any further diagnostic evaluation that may be needed or ordered today. We also reviewed her medications today. she has been encouraged to call the office with any questions or concerns that should arise related to todays visit.    No orders of the defined types were placed in this encounter.   Meds ordered this encounter  Medications  . cyanocobalamin ((VITAMIN B-12)) injection 1,000 mcg    Time spent: 25 Minutes   This patient was seen by Orson Gear AGNP-C in Collaboration with Dr Lavera Guise as a part of collaborative care agreement     Kendell Bane AGNP-C Internal medicine

## 2020-05-08 DIAGNOSIS — E538 Deficiency of other specified B group vitamins: Secondary | ICD-10-CM | POA: Diagnosis not present

## 2020-05-08 DIAGNOSIS — R5081 Fever presenting with conditions classified elsewhere: Secondary | ICD-10-CM | POA: Diagnosis not present

## 2020-05-08 DIAGNOSIS — F419 Anxiety disorder, unspecified: Secondary | ICD-10-CM | POA: Diagnosis not present

## 2020-05-08 DIAGNOSIS — F329 Major depressive disorder, single episode, unspecified: Secondary | ICD-10-CM | POA: Diagnosis not present

## 2020-05-08 DIAGNOSIS — C9002 Multiple myeloma in relapse: Secondary | ICD-10-CM | POA: Diagnosis not present

## 2020-05-08 DIAGNOSIS — Z886 Allergy status to analgesic agent status: Secondary | ICD-10-CM | POA: Diagnosis not present

## 2020-05-08 DIAGNOSIS — Z5112 Encounter for antineoplastic immunotherapy: Secondary | ICD-10-CM | POA: Diagnosis not present

## 2020-05-08 DIAGNOSIS — Z885 Allergy status to narcotic agent status: Secondary | ICD-10-CM | POA: Diagnosis not present

## 2020-05-08 DIAGNOSIS — Z880 Allergy status to penicillin: Secondary | ICD-10-CM | POA: Diagnosis not present

## 2020-05-08 DIAGNOSIS — Z888 Allergy status to other drugs, medicaments and biological substances status: Secondary | ICD-10-CM | POA: Diagnosis not present

## 2020-05-08 DIAGNOSIS — E041 Nontoxic single thyroid nodule: Secondary | ICD-10-CM | POA: Diagnosis not present

## 2020-05-08 DIAGNOSIS — Z91041 Radiographic dye allergy status: Secondary | ICD-10-CM | POA: Diagnosis not present

## 2020-05-08 DIAGNOSIS — Z881 Allergy status to other antibiotic agents status: Secondary | ICD-10-CM | POA: Diagnosis not present

## 2020-05-08 DIAGNOSIS — F418 Other specified anxiety disorders: Secondary | ICD-10-CM | POA: Diagnosis not present

## 2020-05-08 DIAGNOSIS — C9000 Multiple myeloma not having achieved remission: Secondary | ICD-10-CM | POA: Diagnosis not present

## 2020-05-08 DIAGNOSIS — R530 Neoplastic (malignant) related fatigue: Secondary | ICD-10-CM | POA: Diagnosis not present

## 2020-05-08 DIAGNOSIS — Z882 Allergy status to sulfonamides status: Secondary | ICD-10-CM | POA: Diagnosis not present

## 2020-05-08 DIAGNOSIS — R0600 Dyspnea, unspecified: Secondary | ICD-10-CM | POA: Diagnosis not present

## 2020-05-10 ENCOUNTER — Ambulatory Visit (INDEPENDENT_AMBULATORY_CARE_PROVIDER_SITE_OTHER): Payer: PPO

## 2020-05-10 DIAGNOSIS — E538 Deficiency of other specified B group vitamins: Secondary | ICD-10-CM | POA: Diagnosis not present

## 2020-05-10 MED ORDER — CYANOCOBALAMIN 1000 MCG/ML IJ SOLN
1000.0000 ug | Freq: Once | INTRAMUSCULAR | Status: AC
  Administered 2020-05-10: 1000 ug via INTRAMUSCULAR

## 2020-05-17 ENCOUNTER — Other Ambulatory Visit: Payer: Self-pay

## 2020-05-17 ENCOUNTER — Ambulatory Visit (INDEPENDENT_AMBULATORY_CARE_PROVIDER_SITE_OTHER): Payer: PPO

## 2020-05-17 DIAGNOSIS — E538 Deficiency of other specified B group vitamins: Secondary | ICD-10-CM

## 2020-05-17 MED ORDER — CYANOCOBALAMIN 1000 MCG/ML IJ SOLN
1000.0000 ug | Freq: Once | INTRAMUSCULAR | Status: AC
  Administered 2020-05-17: 1000 ug via INTRAMUSCULAR

## 2020-05-24 ENCOUNTER — Other Ambulatory Visit: Payer: Self-pay

## 2020-05-24 ENCOUNTER — Ambulatory Visit (INDEPENDENT_AMBULATORY_CARE_PROVIDER_SITE_OTHER): Payer: PPO

## 2020-05-24 DIAGNOSIS — E538 Deficiency of other specified B group vitamins: Secondary | ICD-10-CM

## 2020-05-24 MED ORDER — CYANOCOBALAMIN 1000 MCG/ML IJ SOLN
1000.0000 ug | Freq: Once | INTRAMUSCULAR | Status: AC
  Administered 2020-05-24: 1000 ug via INTRAMUSCULAR

## 2020-05-31 ENCOUNTER — Ambulatory Visit (INDEPENDENT_AMBULATORY_CARE_PROVIDER_SITE_OTHER): Payer: PPO

## 2020-05-31 ENCOUNTER — Other Ambulatory Visit: Payer: Self-pay

## 2020-05-31 DIAGNOSIS — E538 Deficiency of other specified B group vitamins: Secondary | ICD-10-CM | POA: Diagnosis not present

## 2020-05-31 MED ORDER — CYANOCOBALAMIN 1000 MCG/ML IJ SOLN
1000.0000 ug | Freq: Once | INTRAMUSCULAR | Status: AC
  Administered 2020-05-31: 1000 ug via INTRAMUSCULAR

## 2020-06-04 DIAGNOSIS — C9000 Multiple myeloma not having achieved remission: Secondary | ICD-10-CM | POA: Diagnosis not present

## 2020-06-04 DIAGNOSIS — Z886 Allergy status to analgesic agent status: Secondary | ICD-10-CM | POA: Diagnosis not present

## 2020-06-04 DIAGNOSIS — R0600 Dyspnea, unspecified: Secondary | ICD-10-CM | POA: Diagnosis not present

## 2020-06-04 DIAGNOSIS — Z885 Allergy status to narcotic agent status: Secondary | ICD-10-CM | POA: Diagnosis not present

## 2020-06-04 DIAGNOSIS — Z5111 Encounter for antineoplastic chemotherapy: Secondary | ICD-10-CM | POA: Diagnosis not present

## 2020-06-04 DIAGNOSIS — D61810 Antineoplastic chemotherapy induced pancytopenia: Secondary | ICD-10-CM | POA: Diagnosis not present

## 2020-06-04 DIAGNOSIS — R110 Nausea: Secondary | ICD-10-CM | POA: Diagnosis not present

## 2020-06-04 DIAGNOSIS — D801 Nonfamilial hypogammaglobulinemia: Secondary | ICD-10-CM | POA: Diagnosis not present

## 2020-06-04 DIAGNOSIS — C9002 Multiple myeloma in relapse: Secondary | ICD-10-CM | POA: Diagnosis not present

## 2020-06-04 DIAGNOSIS — Z880 Allergy status to penicillin: Secondary | ICD-10-CM | POA: Diagnosis not present

## 2020-06-04 DIAGNOSIS — Z882 Allergy status to sulfonamides status: Secondary | ICD-10-CM | POA: Diagnosis not present

## 2020-06-04 DIAGNOSIS — Z91041 Radiographic dye allergy status: Secondary | ICD-10-CM | POA: Diagnosis not present

## 2020-06-04 DIAGNOSIS — E538 Deficiency of other specified B group vitamins: Secondary | ICD-10-CM | POA: Diagnosis not present

## 2020-06-04 DIAGNOSIS — E041 Nontoxic single thyroid nodule: Secondary | ICD-10-CM | POA: Diagnosis not present

## 2020-06-04 DIAGNOSIS — C9001 Multiple myeloma in remission: Secondary | ICD-10-CM | POA: Diagnosis not present

## 2020-06-06 DIAGNOSIS — H353221 Exudative age-related macular degeneration, left eye, with active choroidal neovascularization: Secondary | ICD-10-CM | POA: Diagnosis not present

## 2020-06-07 ENCOUNTER — Other Ambulatory Visit: Payer: Self-pay

## 2020-06-07 ENCOUNTER — Ambulatory Visit (INDEPENDENT_AMBULATORY_CARE_PROVIDER_SITE_OTHER): Payer: PPO

## 2020-06-07 DIAGNOSIS — E538 Deficiency of other specified B group vitamins: Secondary | ICD-10-CM

## 2020-06-07 MED ORDER — CYANOCOBALAMIN 1000 MCG/ML IJ SOLN
1000.0000 ug | Freq: Once | INTRAMUSCULAR | Status: AC
  Administered 2020-06-07: 1000 ug via INTRAMUSCULAR

## 2020-06-14 ENCOUNTER — Ambulatory Visit (INDEPENDENT_AMBULATORY_CARE_PROVIDER_SITE_OTHER): Payer: PPO

## 2020-06-14 ENCOUNTER — Other Ambulatory Visit: Payer: Self-pay

## 2020-06-14 DIAGNOSIS — E538 Deficiency of other specified B group vitamins: Secondary | ICD-10-CM | POA: Diagnosis not present

## 2020-06-14 MED ORDER — CYANOCOBALAMIN 1000 MCG/ML IJ SOLN
1000.0000 ug | Freq: Once | INTRAMUSCULAR | Status: AC
Start: 2020-06-14 — End: 2020-06-14
  Administered 2020-06-14: 1000 ug via INTRAMUSCULAR

## 2020-06-21 ENCOUNTER — Ambulatory Visit (INDEPENDENT_AMBULATORY_CARE_PROVIDER_SITE_OTHER): Payer: PPO

## 2020-06-21 ENCOUNTER — Other Ambulatory Visit: Payer: Self-pay

## 2020-06-21 DIAGNOSIS — E538 Deficiency of other specified B group vitamins: Secondary | ICD-10-CM

## 2020-06-21 MED ORDER — CYANOCOBALAMIN 1000 MCG/ML IJ SOLN
1000.0000 ug | Freq: Once | INTRAMUSCULAR | Status: AC
  Administered 2020-06-21: 1000 ug via INTRAMUSCULAR

## 2020-07-02 DIAGNOSIS — C9000 Multiple myeloma not having achieved remission: Secondary | ICD-10-CM | POA: Diagnosis not present

## 2020-07-02 DIAGNOSIS — C9001 Multiple myeloma in remission: Secondary | ICD-10-CM | POA: Diagnosis not present

## 2020-07-02 DIAGNOSIS — E538 Deficiency of other specified B group vitamins: Secondary | ICD-10-CM | POA: Diagnosis not present

## 2020-07-02 DIAGNOSIS — Z5112 Encounter for antineoplastic immunotherapy: Secondary | ICD-10-CM | POA: Diagnosis not present

## 2020-07-02 DIAGNOSIS — R0600 Dyspnea, unspecified: Secondary | ICD-10-CM | POA: Diagnosis not present

## 2020-07-09 DIAGNOSIS — H353221 Exudative age-related macular degeneration, left eye, with active choroidal neovascularization: Secondary | ICD-10-CM | POA: Diagnosis not present

## 2020-07-15 ENCOUNTER — Ambulatory Visit (INDEPENDENT_AMBULATORY_CARE_PROVIDER_SITE_OTHER): Payer: PPO | Admitting: Adult Health

## 2020-07-15 ENCOUNTER — Other Ambulatory Visit: Payer: Self-pay

## 2020-07-15 ENCOUNTER — Encounter: Payer: Self-pay | Admitting: Adult Health

## 2020-07-15 VITALS — BP 136/74 | HR 71 | Temp 96.1°F | Resp 16 | Ht 64.0 in | Wt 137.8 lb

## 2020-07-15 DIAGNOSIS — R05 Cough: Secondary | ICD-10-CM | POA: Diagnosis not present

## 2020-07-15 DIAGNOSIS — R059 Cough, unspecified: Secondary | ICD-10-CM

## 2020-07-15 DIAGNOSIS — J988 Other specified respiratory disorders: Secondary | ICD-10-CM | POA: Diagnosis not present

## 2020-07-15 MED ORDER — AZITHROMYCIN 250 MG PO TABS: ORAL_TABLET | ORAL | 0 refills | Status: DC

## 2020-07-15 NOTE — Progress Notes (Signed)
[facility] [place], [place] [address]  Internal MEDICINE  Office Visit Note  Patient Name: Sherry Oneal  [id number]  [id number]  Date of Service: 08/09/2020  Chief Complaint  Patient presents with  . Acute Visit    drainage for a while for several weeks, hoarseness since saturday 07/13/20, left ear popping since end of last week  . Hyperlipidemia  . Hypertension     HPI Pt is here for a sick visit. She reports a few week of PND and allergy symptoms.  She reports 2 days ago she lost her voice and has noticed her left ear has an intermittent popping sound.  She denies any fever.  She has been coughing for a few weeks.  She does have some fatigue. She complains of facial pressure and tenderness.       Current Medication:  Outpatient Encounter Medications as of 07/15/2020  Medication Sig  . acetaminophen (TYLENOL) 500 MG tablet Take 500 mg by mouth every 4 (four) hours as needed.   Marland Kitchen albuterol (PROVENTIL HFA;VENTOLIN HFA) 108 (90 Base) MCG/ACT inhaler Inhale 2 puffs into the lungs every 6 (six) hours as needed for wheezing or shortness of breath.  . ALPRAZolam (XANAX) 0.25 MG tablet Take 1 tablet (0.25 mg total) by mouth at bedtime as needed.  Marland Kitchen atorvastatin (LIPITOR) 10 MG tablet Take 1 tablet (10 mg total) by mouth daily.  . Calcium Carbonate-Vitamin D 600-400 MG-UNIT tablet Take 1 tablet by mouth daily.  . cholecalciferol (DELTA D3) 10 MCG (400 UNIT) TABS tablet Take 400 Units by mouth daily.  . Cranberry 500 MG CAPS Take 500 mg by mouth every morning.  Marland Kitchen DARATUMUMAB IV Inject into the vein every 30 (thirty) days.  Marland Kitchen dexamethasone (DECADRON) 6 MG tablet Take 12 mg by mouth every 30 (thirty) days.   Marland Kitchen dicyclomine (BENTYL) 10 MG capsule Take 1 capsule (10 mg total) by mouth 3 (three) times daily with meals as needed for spasms.  . diphenhydrAMINE (BENADRYL ALLERGY) 25 MG tablet Take 25 mg by mouth daily as needed.  . docusate (COLACE) 50 MG/5ML liquid  Take by mouth daily as needed for mild constipation.  Marland Kitchen ELIQUIS 5 MG TABS tablet Take 1 tablet by mouth 2 (two) times daily.  . fluticasone (FLONASE) 50 MCG/ACT nasal spray Place into both nostrils daily.  Marland Kitchen ipratropium-albuterol (DUONEB) 0.5-2.5 (3) MG/3ML SOLN Take 3 mLs by nebulization every 6 (six) hours as needed.  Marland Kitchen lisinopril (ZESTRIL) 5 MG tablet Take 1 tablet (5 mg total) by mouth daily.  . meclizine (ANTIVERT) 25 MG tablet Take 25 mg by mouth as needed for dizziness.  . Multiple Vitamins-Minerals (PRESERVISION AREDS PO) Take 1 tablet by mouth every morning.  Marland Kitchen omeprazole (PRILOSEC) 20 MG capsule Take 20 mg by mouth daily.  . ondansetron (ZOFRAN-ODT) 4 MG disintegrating tablet DISSOLVE 1 TABLET IN MOUTH EVERY 8 HOURS AS NEEDED FOR NAUSEA  . polyethylene glycol (MIRALAX / GLYCOLAX) packet Take 17 g by mouth daily as needed.  . pomalidomide (POMALYST) 4 MG capsule Take 1 capsule by mouth once daily for days 1-21. 7 days rest.  . prochlorperazine (COMPAZINE) 5 MG tablet Take 5 mg by mouth every 8 (eight) hours as needed.  . promethazine (PHENERGAN) 25 MG tablet Take 1 tablet (25 mg total) by mouth every 6 (six) hours as needed for nausea or vomiting.  . senna (SENOKOT) 8.6 MG tablet Take 1 tablet by mouth daily as needed for constipation.  . valACYclovir (VALTREX)  500 MG tablet Take 500 mg by mouth daily.  Marland Kitchen azithromycin (ZITHROMAX) 250 MG tablet Take as directed   No facility-administered encounter medications on file as of 07/15/2020.      Medical History: Past Medical History:  Diagnosis Date  . Anemia   . Arthritis    lower back  . High blood pressure   . High cholesterol   . Multiple myeloma ([place])   . Renal insufficiency   . Wears dentures    partial upper     Vital Signs: BP 136/74   Pulse 71   Temp (!) 96.1 F (35.6 C)   Resp 16   Ht 5' 4"  (1.626 m)   Wt 137 lb 12.8 oz (62.5 kg)   SpO2 98%   BMI 23.65 kg/m    Review of Systems  Constitutional: Negative  for chills, fatigue and unexpected weight change.  HENT: Negative for congestion, rhinorrhea, sneezing and sore throat.   Eyes: Negative for photophobia, pain and redness.  Respiratory: Negative for cough, chest tightness and shortness of breath.   Cardiovascular: Negative for chest pain and palpitations.  Gastrointestinal: Negative for abdominal pain, constipation, diarrhea, nausea and vomiting.  Endocrine: Negative.   Genitourinary: Negative for dysuria and frequency.  Musculoskeletal: Negative for arthralgias, back pain, joint swelling and neck pain.  Skin: Negative for rash.  Allergic/Immunologic: Negative.   Neurological: Negative for tremors and numbness.  Hematological: Negative for adenopathy. Does not bruise/bleed easily.  Psychiatric/Behavioral: Negative for behavioral problems and sleep disturbance. The patient is not nervous/anxious.     Physical Exam Vitals and nursing note reviewed.  Constitutional:      General: She is not in acute distress.    Appearance: She is well-developed. She is not diaphoretic.  HENT:     Head: Normocephalic and atraumatic.     Mouth/Throat:     Pharynx: No oropharyngeal exudate.  Eyes:     Pupils: Pupils are equal, round, and reactive to light.  Neck:     Thyroid: No thyromegaly.     Vascular: No JVD.     Trachea: No tracheal deviation.  Cardiovascular:     Rate and Rhythm: Normal rate and regular rhythm.     Heart sounds: Normal heart sounds. No murmur heard.  No friction rub. No gallop.   Pulmonary:     Effort: Pulmonary effort is normal. No respiratory distress.     Breath sounds: Normal breath sounds. No wheezing or rales.  Chest:     Chest wall: No tenderness.  Abdominal:     Palpations: Abdomen is soft.     Tenderness: There is no abdominal tenderness. There is no guarding.  Musculoskeletal:        General: Normal range of motion.     Cervical back: Normal range of motion and neck supple.  Lymphadenopathy:     Cervical: No  cervical adenopathy.  Skin:    General: Skin is warm and dry.  Neurological:     Mental Status: She is alert and oriented to person, place, and time.     Cranial Nerves: No cranial nerve deficit.  Psychiatric:        Behavior: Behavior normal.        Thought Content: Thought content normal.        Judgment: Judgment normal.    Assessment/Plan: 1. Respiratory infection Advised patient to take entire course of antibiotics as prescribed with food. Pt should return to clinic in 7-10 days if symptoms fail to improve or  new symptoms develop.  - azithromycin (ZITHROMAX) 250 MG tablet; Take as directed  Dispense: 6 tablet; Refill: 0  2. Cough Stable, continue to use otc medications.   General Counseling: isella slatten understanding of the findings of todays visit and agrees with plan of treatment. I have discussed any further diagnostic evaluation that may be needed or ordered today. We also reviewed her medications today. she has been encouraged to call the office with any questions or concerns that should arise related to todays visit.   No orders of the defined types were placed in this encounter.   Meds ordered this encounter  Medications  . azithromycin (ZITHROMAX) 250 MG tablet    Sig: Take as directed    Dispense:  6 tablet    Refill:  0    Time spent: 25 Minutes  This patient was seen by Orson Gear AGNP-C in Collaboration with Dr Lavera Guise as a part of collaborative care agreement.  Kendell Bane AGNP-C Internal Medicine

## 2020-07-22 DIAGNOSIS — H353221 Exudative age-related macular degeneration, left eye, with active choroidal neovascularization: Secondary | ICD-10-CM | POA: Diagnosis not present

## 2020-07-30 DIAGNOSIS — Z91041 Radiographic dye allergy status: Secondary | ICD-10-CM | POA: Diagnosis not present

## 2020-07-30 DIAGNOSIS — C9000 Multiple myeloma not having achieved remission: Secondary | ICD-10-CM | POA: Diagnosis not present

## 2020-07-30 DIAGNOSIS — Z79899 Other long term (current) drug therapy: Secondary | ICD-10-CM | POA: Diagnosis not present

## 2020-07-30 DIAGNOSIS — E538 Deficiency of other specified B group vitamins: Secondary | ICD-10-CM | POA: Diagnosis not present

## 2020-07-30 DIAGNOSIS — F419 Anxiety disorder, unspecified: Secondary | ICD-10-CM | POA: Diagnosis not present

## 2020-07-30 DIAGNOSIS — C9002 Multiple myeloma in relapse: Secondary | ICD-10-CM | POA: Diagnosis not present

## 2020-07-30 DIAGNOSIS — Z886 Allergy status to analgesic agent status: Secondary | ICD-10-CM | POA: Diagnosis not present

## 2020-07-30 DIAGNOSIS — R0600 Dyspnea, unspecified: Secondary | ICD-10-CM | POA: Diagnosis not present

## 2020-07-30 DIAGNOSIS — C9001 Multiple myeloma in remission: Secondary | ICD-10-CM | POA: Diagnosis not present

## 2020-07-30 DIAGNOSIS — Z5112 Encounter for antineoplastic immunotherapy: Secondary | ICD-10-CM | POA: Diagnosis not present

## 2020-07-30 DIAGNOSIS — H3530 Unspecified macular degeneration: Secondary | ICD-10-CM | POA: Diagnosis not present

## 2020-07-30 DIAGNOSIS — F329 Major depressive disorder, single episode, unspecified: Secondary | ICD-10-CM | POA: Diagnosis not present

## 2020-07-30 DIAGNOSIS — E041 Nontoxic single thyroid nodule: Secondary | ICD-10-CM | POA: Diagnosis not present

## 2020-07-30 DIAGNOSIS — Z882 Allergy status to sulfonamides status: Secondary | ICD-10-CM | POA: Diagnosis not present

## 2020-07-30 DIAGNOSIS — Z880 Allergy status to penicillin: Secondary | ICD-10-CM | POA: Diagnosis not present

## 2020-07-30 DIAGNOSIS — Z885 Allergy status to narcotic agent status: Secondary | ICD-10-CM | POA: Diagnosis not present

## 2020-08-12 ENCOUNTER — Telehealth: Payer: Self-pay

## 2020-08-12 ENCOUNTER — Other Ambulatory Visit: Payer: Self-pay

## 2020-08-12 ENCOUNTER — Ambulatory Visit (INDEPENDENT_AMBULATORY_CARE_PROVIDER_SITE_OTHER): Payer: PPO | Admitting: Dermatology

## 2020-08-12 DIAGNOSIS — L565 Disseminated superficial actinic porokeratosis (DSAP): Secondary | ICD-10-CM

## 2020-08-12 DIAGNOSIS — L570 Actinic keratosis: Secondary | ICD-10-CM

## 2020-08-12 DIAGNOSIS — L821 Other seborrheic keratosis: Secondary | ICD-10-CM | POA: Diagnosis not present

## 2020-08-12 NOTE — Progress Notes (Signed)
° °  New Patient Visit  Subjective  Sherry Oneal is a 74 y.o. female who presents for the following: check spots (arms, legs, chest, ~68yr, ones on chest itchy, pt feels the ones on arms and legs have worsened since being on chemo  New patient, no hx of skin ca).   The following portions of the chart were reviewed this encounter and updated as appropriate:      Review of Systems:  No other skin or systemic complaints except as noted in HPI or Assessment and Plan.  Objective  Well appearing patient in no apparent distress; mood and affect are within normal limits.  A focused examination was performed including chest, arms, legs. Relevant physical exam findings are noted in the Assessment and Plan.  Objective  bil arms, legs: Pink scaly macules with keratotic rim  Objective  Chest x 3 (3): Pink scaly macules   Objective  bil legs: Stuck-on, waxy, tan-brown papules -- Discussed benign etiology and prognosis.    Assessment & Plan  DSAP (disseminated superficial actinic porokeratosis) bil arms, legs  Observe, rec. Photoprotection (long sleeves/pants), sunscreen daily Benign inherited trait worsened by sun exposure. Discussed difficult to treat.  Chemo may have made them more visually prominent due to increased inflammation, but didn't cause them to appear.  May try Skin Medicinals 2% chol/lovastatin cream qd/bid to aas.  Not covered by insurance Recommend Amlactin Rapid Relief qhs or Eucerin Roughness relief lotion/cream, samples given  AK (actinic keratosis) (3) Chest x 3  Vrs ISKs  Destruction of lesion - Chest x 3  Destruction method: cryotherapy   Informed consent: discussed and consent obtained   Lesion destroyed using liquid nitrogen: Yes   Region frozen until ice ball extended beyond lesion: Yes   Outcome: patient tolerated procedure well with no complications   Post-procedure details: wound care instructions given    Seborrheic keratosis bil legs  Benign,  observe.    Return in about 1 year (around 08/12/2021).   I, Othelia Pulling, RMA, am acting as scribe for Brendolyn Patty, MD .  Documentation: I have reviewed the above documentation for accuracy and completeness, and I agree with the above.  Brendolyn Patty MD

## 2020-08-12 NOTE — Patient Instructions (Addendum)
DSAP - Disseminated superficial actenic porokeratosis on arms and legs  May use Gold bond rough and bumpy, or Cerave SA or Amlactin Rapid Relief, or Eucerin roughness relief

## 2020-08-12 NOTE — Telephone Encounter (Signed)
Lft pt msg to call office to discuss topical cream she can use for DSAP on her arms and legs.Mariana Kaufman

## 2020-08-13 NOTE — Telephone Encounter (Signed)
Advised pt that there was a topical cream Cholesterol/Lovastatin that we could send in for her to use on her DSAP on arms and legs.  Advised that she would not be able to use on entire areas, but if there were some that irritated her she could use the cream and it would help flatten out the DSAP and make it less apparent.  Pt said she will just try some of the otc creams that Dr. Nicole Kindred suggested./sh

## 2020-08-27 DIAGNOSIS — Z5112 Encounter for antineoplastic immunotherapy: Secondary | ICD-10-CM | POA: Diagnosis not present

## 2020-08-27 DIAGNOSIS — E538 Deficiency of other specified B group vitamins: Secondary | ICD-10-CM | POA: Diagnosis not present

## 2020-08-27 DIAGNOSIS — R0600 Dyspnea, unspecified: Secondary | ICD-10-CM | POA: Diagnosis not present

## 2020-08-27 DIAGNOSIS — C9000 Multiple myeloma not having achieved remission: Secondary | ICD-10-CM | POA: Diagnosis not present

## 2020-09-03 DIAGNOSIS — H353221 Exudative age-related macular degeneration, left eye, with active choroidal neovascularization: Secondary | ICD-10-CM | POA: Diagnosis not present

## 2020-09-09 ENCOUNTER — Other Ambulatory Visit: Payer: Self-pay | Admitting: Adult Health

## 2020-09-09 DIAGNOSIS — K529 Noninfective gastroenteritis and colitis, unspecified: Secondary | ICD-10-CM

## 2020-09-24 DIAGNOSIS — C9001 Multiple myeloma in remission: Secondary | ICD-10-CM | POA: Diagnosis not present

## 2020-09-24 DIAGNOSIS — L579 Skin changes due to chronic exposure to nonionizing radiation, unspecified: Secondary | ICD-10-CM | POA: Diagnosis not present

## 2020-09-24 DIAGNOSIS — N281 Cyst of kidney, acquired: Secondary | ICD-10-CM | POA: Diagnosis not present

## 2020-09-24 DIAGNOSIS — Z880 Allergy status to penicillin: Secondary | ICD-10-CM | POA: Diagnosis not present

## 2020-09-24 DIAGNOSIS — Z886 Allergy status to analgesic agent status: Secondary | ICD-10-CM | POA: Diagnosis not present

## 2020-09-24 DIAGNOSIS — R0600 Dyspnea, unspecified: Secondary | ICD-10-CM | POA: Diagnosis not present

## 2020-09-24 DIAGNOSIS — Z91041 Radiographic dye allergy status: Secondary | ICD-10-CM | POA: Diagnosis not present

## 2020-09-24 DIAGNOSIS — E041 Nontoxic single thyroid nodule: Secondary | ICD-10-CM | POA: Diagnosis not present

## 2020-09-24 DIAGNOSIS — M8588 Other specified disorders of bone density and structure, other site: Secondary | ICD-10-CM | POA: Diagnosis not present

## 2020-09-24 DIAGNOSIS — Z9221 Personal history of antineoplastic chemotherapy: Secondary | ICD-10-CM | POA: Diagnosis not present

## 2020-09-24 DIAGNOSIS — H3530 Unspecified macular degeneration: Secondary | ICD-10-CM | POA: Diagnosis not present

## 2020-09-24 DIAGNOSIS — E538 Deficiency of other specified B group vitamins: Secondary | ICD-10-CM | POA: Diagnosis not present

## 2020-09-24 DIAGNOSIS — F329 Major depressive disorder, single episode, unspecified: Secondary | ICD-10-CM | POA: Diagnosis not present

## 2020-09-24 DIAGNOSIS — N261 Atrophy of kidney (terminal): Secondary | ICD-10-CM | POA: Diagnosis not present

## 2020-09-24 DIAGNOSIS — C9000 Multiple myeloma not having achieved remission: Secondary | ICD-10-CM | POA: Diagnosis not present

## 2020-09-24 DIAGNOSIS — Z5111 Encounter for antineoplastic chemotherapy: Secondary | ICD-10-CM | POA: Diagnosis not present

## 2020-09-24 DIAGNOSIS — Z885 Allergy status to narcotic agent status: Secondary | ICD-10-CM | POA: Diagnosis not present

## 2020-09-24 DIAGNOSIS — Z882 Allergy status to sulfonamides status: Secondary | ICD-10-CM | POA: Diagnosis not present

## 2020-09-24 DIAGNOSIS — F419 Anxiety disorder, unspecified: Secondary | ICD-10-CM | POA: Diagnosis not present

## 2020-10-03 ENCOUNTER — Other Ambulatory Visit: Payer: Self-pay | Admitting: Adult Health

## 2020-10-03 DIAGNOSIS — I10 Essential (primary) hypertension: Secondary | ICD-10-CM

## 2020-10-08 DIAGNOSIS — H353221 Exudative age-related macular degeneration, left eye, with active choroidal neovascularization: Secondary | ICD-10-CM | POA: Diagnosis not present

## 2020-10-22 DIAGNOSIS — C9000 Multiple myeloma not having achieved remission: Secondary | ICD-10-CM | POA: Diagnosis not present

## 2020-10-22 DIAGNOSIS — E538 Deficiency of other specified B group vitamins: Secondary | ICD-10-CM | POA: Diagnosis not present

## 2020-10-22 DIAGNOSIS — C9001 Multiple myeloma in remission: Secondary | ICD-10-CM | POA: Diagnosis not present

## 2020-10-22 DIAGNOSIS — Z5112 Encounter for antineoplastic immunotherapy: Secondary | ICD-10-CM | POA: Diagnosis not present

## 2020-10-22 DIAGNOSIS — R0600 Dyspnea, unspecified: Secondary | ICD-10-CM | POA: Diagnosis not present

## 2020-11-19 DIAGNOSIS — Z5112 Encounter for antineoplastic immunotherapy: Secondary | ICD-10-CM | POA: Diagnosis not present

## 2020-11-19 DIAGNOSIS — R110 Nausea: Secondary | ICD-10-CM | POA: Diagnosis not present

## 2020-11-19 DIAGNOSIS — E041 Nontoxic single thyroid nodule: Secondary | ICD-10-CM | POA: Diagnosis not present

## 2020-11-19 DIAGNOSIS — R0600 Dyspnea, unspecified: Secondary | ICD-10-CM | POA: Diagnosis not present

## 2020-11-19 DIAGNOSIS — E538 Deficiency of other specified B group vitamins: Secondary | ICD-10-CM | POA: Diagnosis not present

## 2020-11-19 DIAGNOSIS — C9000 Multiple myeloma not having achieved remission: Secondary | ICD-10-CM | POA: Diagnosis not present

## 2020-11-19 DIAGNOSIS — R5383 Other fatigue: Secondary | ICD-10-CM | POA: Diagnosis not present

## 2020-11-19 DIAGNOSIS — Z79899 Other long term (current) drug therapy: Secondary | ICD-10-CM | POA: Diagnosis not present

## 2020-11-19 DIAGNOSIS — K5900 Constipation, unspecified: Secondary | ICD-10-CM | POA: Diagnosis not present

## 2020-11-19 DIAGNOSIS — F418 Other specified anxiety disorders: Secondary | ICD-10-CM | POA: Diagnosis not present

## 2020-11-19 DIAGNOSIS — C9001 Multiple myeloma in remission: Secondary | ICD-10-CM | POA: Diagnosis not present

## 2020-11-19 DIAGNOSIS — H3530 Unspecified macular degeneration: Secondary | ICD-10-CM | POA: Diagnosis not present

## 2020-11-26 DIAGNOSIS — H353221 Exudative age-related macular degeneration, left eye, with active choroidal neovascularization: Secondary | ICD-10-CM | POA: Diagnosis not present

## 2020-11-28 ENCOUNTER — Encounter: Payer: Self-pay | Admitting: Nurse Practitioner

## 2020-11-28 ENCOUNTER — Ambulatory Visit (INDEPENDENT_AMBULATORY_CARE_PROVIDER_SITE_OTHER): Payer: PPO | Admitting: Nurse Practitioner

## 2020-11-28 ENCOUNTER — Other Ambulatory Visit: Payer: Self-pay

## 2020-11-28 VITALS — BP 144/69 | HR 81 | Temp 97.5°F | Resp 16 | Ht 64.0 in | Wt 136.0 lb

## 2020-11-28 DIAGNOSIS — C9000 Multiple myeloma not having achieved remission: Secondary | ICD-10-CM | POA: Diagnosis not present

## 2020-11-28 DIAGNOSIS — E782 Mixed hyperlipidemia: Secondary | ICD-10-CM | POA: Diagnosis not present

## 2020-11-28 DIAGNOSIS — R300 Dysuria: Secondary | ICD-10-CM | POA: Diagnosis not present

## 2020-11-28 DIAGNOSIS — H3530 Unspecified macular degeneration: Secondary | ICD-10-CM

## 2020-11-28 DIAGNOSIS — Z0001 Encounter for general adult medical examination with abnormal findings: Secondary | ICD-10-CM | POA: Diagnosis not present

## 2020-11-28 DIAGNOSIS — H353220 Exudative age-related macular degeneration, left eye, stage unspecified: Secondary | ICD-10-CM | POA: Insufficient documentation

## 2020-11-28 DIAGNOSIS — I10 Essential (primary) hypertension: Secondary | ICD-10-CM

## 2020-11-28 NOTE — Progress Notes (Signed)
[facility] [place], [place] [address]  Internal MEDICINE  Office Visit Note  Patient Name: Sherry Oneal  [id number]  [id number]  Date of Service: 11/28/2020   Pt is here for routine health maintenance examination  Chief Complaint  Patient presents with  . Medicare Wellness  . Hyperlipidemia  . Hypertension  . controlled substance form    reviewed with PT     The patient is here for health maintenance exam. She states that she is now getting injections into her left eye due to wet type macular degeneration. She states that she is tolerating these well and they are helping.  She still goes to [facility] oncology monthly for chemotherapy infusions due to multiple myeloma. She does take a "cancer pill" every night.  Reviewed routine, fasting labs, done at [facility] 11/2019. Triglycerides were mildly elevated, but remainder of lipid panel was normal. She does take Lipitor every evening and tolerates this well.  The patient has no new concerns or complaints today. She has been fully vaccinated against COVID 19 including booster.     Current Medication: Outpatient Encounter Medications as of 11/28/2020  Medication Sig  . acetaminophen (TYLENOL) 500 MG tablet Take 500 mg by mouth every 4 (four) hours as needed.   Marland Kitchen albuterol (PROVENTIL HFA;VENTOLIN HFA) 108 (90 Base) MCG/ACT inhaler Inhale 2 puffs into the lungs every 6 (six) hours as needed for wheezing or shortness of breath.  . ALPRAZolam (XANAX) 0.25 MG tablet Take 1 tablet (0.25 mg total) by mouth at bedtime as needed.  Marland Kitchen atorvastatin (LIPITOR) 10 MG tablet Take 1 tablet by mouth once daily  . Calcium Carbonate-Vitamin D 600-400 MG-UNIT tablet Take 1 tablet by mouth daily.  . cholecalciferol (DELTA D3) 10 MCG (400 UNIT) TABS tablet Take 400 Units by mouth daily.  . Cranberry 500 MG CAPS Take 500 mg by mouth every morning.  Marland Kitchen DARATUMUMAB IV Inject into the vein every 30 (thirty) days.  Marland Kitchen dexamethasone (DECADRON) 6  MG tablet Take 12 mg by mouth every 30 (thirty) days.   Marland Kitchen dicyclomine (BENTYL) 10 MG capsule TAKE 1 CAPSULE BY MOUTH THREE TIMES DAILY AS NEEDED FOR  SPASMS.  TAKE  WITH  MEALS  . diphenhydrAMINE (BENADRYL ALLERGY) 25 MG tablet Take 25 mg by mouth daily as needed.  . docusate (COLACE) 50 MG/5ML liquid Take by mouth daily as needed for mild constipation.  Marland Kitchen ELIQUIS 5 MG TABS tablet Take 1 tablet by mouth 2 (two) times daily.  . fluticasone (FLONASE) 50 MCG/ACT nasal spray Place into both nostrils daily.  Marland Kitchen ipratropium-albuterol (DUONEB) 0.5-2.5 (3) MG/3ML SOLN Take 3 mLs by nebulization every 6 (six) hours as needed.  Marland Kitchen lisinopril (ZESTRIL) 5 MG tablet Take 1 tablet by mouth once daily  . meclizine (ANTIVERT) 25 MG tablet Take 25 mg by mouth as needed for dizziness.  . Multiple Vitamins-Minerals (PRESERVISION AREDS PO) Take 1 tablet by mouth every morning.  Marland Kitchen omeprazole (PRILOSEC) 20 MG capsule Take 20 mg by mouth daily.  . ondansetron (ZOFRAN-ODT) 4 MG disintegrating tablet DISSOLVE 1 TABLET IN MOUTH EVERY 8 HOURS AS NEEDED FOR NAUSEA  . polyethylene glycol (MIRALAX / GLYCOLAX) packet Take 17 g by mouth daily as needed.  . pomalidomide (POMALYST) 4 MG capsule Take 1 capsule by mouth once daily for days 1-21. 7 days rest.  . prochlorperazine (COMPAZINE) 5 MG tablet Take 5 mg by mouth every 8 (eight) hours as needed.  . promethazine (PHENERGAN) 25 MG tablet Take  1 tablet (25 mg total) by mouth every 6 (six) hours as needed for nausea or vomiting.  . senna (SENOKOT) 8.6 MG tablet Take 1 tablet by mouth daily as needed for constipation.  . valACYclovir (VALTREX) 500 MG tablet Take 500 mg by mouth daily.  . [DISCONTINUED] azithromycin (ZITHROMAX) 250 MG tablet Take as directed   No facility-administered encounter medications on file as of 11/28/2020.    Surgical History: Past Surgical History:  Procedure Laterality Date  . CATARACT EXTRACTION W/PHACO Right 08/17/2018   Procedure: CATARACT  EXTRACTION PHACO AND INTRAOCULAR LENS PLACEMENT ([place])  RIGHT;  Surgeon: Leandrew Koyanagi, MD;  Location: [place];  Service: Ophthalmology;  Laterality: Right;  . CATARACT EXTRACTION W/PHACO Left 11/09/2018   Procedure: CATARACT EXTRACTION PHACO AND INTRAOCULAR LENS PLACEMENT ([place]) LEFT;  Surgeon: Leandrew Koyanagi, MD;  Location: [place];  Service: Ophthalmology;  Laterality: Left;  . EXPLORATORY LAPAROTOMY      Medical History: Past Medical History:  Diagnosis Date  . Anemia   . Arthritis    lower back  . High blood pressure   . High cholesterol   . Macular degeneration    recieving 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   . Multiple myeloma ([place])   . Renal insufficiency   . Wears dentures    partial upper    Family History: Family History  Problem Relation Age of Onset  . Breast cancer Maternal Grandmother 73  . CAD Mother   . CVA Mother       Review of Systems  Constitutional: Negative for activity change, chills, fatigue and unexpected weight change.  HENT: Negative for congestion, postnasal drip, rhinorrhea, sneezing and sore throat.   Eyes:       Patient is receiving injections into her left eye to treat wet-type macular degeneration.   Respiratory: Negative for cough, chest tightness, shortness of breath and wheezing.   Cardiovascular: Negative for chest pain and palpitations.  Gastrointestinal: Negative for abdominal pain, constipation, diarrhea, nausea and vomiting.  Endocrine: Negative for cold intolerance, heat intolerance, polydipsia and polyuria.  Genitourinary: Negative for dysuria, frequency and urgency.  Musculoskeletal: Negative for arthralgias, back pain, joint swelling and neck pain.  Skin: Negative for rash.  Allergic/Immunologic: Negative for environmental allergies.  Neurological: Negative for tremors, numbness and headaches.  Hematological: Negative for adenopathy. Does not bruise/bleed easily.   Psychiatric/Behavioral: Negative for behavioral problems (Depression), sleep disturbance and suicidal ideas. The patient is not nervous/anxious.     Today's Vitals   11/28/20 0827  BP: (!) 144/69  Pulse: 81  Resp: 16  Temp: (!) 97.5 F (36.4 C)  SpO2: 99%  Weight: 136 lb (61.7 kg)  Height: _0  (1.626 m)   Body mass index is 23.34 kg/m.  Physical Exam Vitals and nursing note reviewed.  Constitutional:      General: She is not in acute distress.    Appearance: Normal appearance. She is well-developed. She is not diaphoretic.  HENT:     Head: Normocephalic and atraumatic.     Nose: Nose normal.     Mouth/Throat:     Pharynx: No oropharyngeal exudate.  Eyes:     Pupils: Pupils are equal, round, and reactive to light.  Neck:     Thyroid: No thyromegaly.     Vascular: No carotid bruit or JVD.     Trachea: No tracheal deviation.  Cardiovascular:     Rate and Rhythm: Normal rate and regular rhythm.  Pulses: Normal pulses.     Heart sounds: Normal heart sounds. No murmur heard.  No friction rub. No gallop.   Pulmonary:     Effort: Pulmonary effort is normal. No respiratory distress.     Breath sounds: Normal breath sounds. No wheezing or rales.  Chest:     Chest wall: No tenderness.  Abdominal:     General: Bowel sounds are normal.     Palpations: Abdomen is soft.     Tenderness: There is no abdominal tenderness.  Musculoskeletal:        General: Normal range of motion.     Cervical back: Normal range of motion and neck supple.  Lymphadenopathy:     Cervical: No cervical adenopathy.  Skin:    General: Skin is warm and dry.     Capillary Refill: Capillary refill takes less than 2 seconds.  Neurological:     General: No focal deficit present.     Mental Status: She is alert and oriented to person, place, and time.     Cranial Nerves: No cranial nerve deficit.  Psychiatric:        Mood and Affect: Mood normal.        Behavior: Behavior normal.        Thought  Content: Thought content normal.        Judgment: Judgment normal.     Depression screen [facility] 2/9 11/28/2020 04/16/2020 02/13/2020 12/14/2019 11/20/2019  Decreased Interest 0 0 0 0 0  Down, Depressed, Hopeless 0 0 0 0 0  PHQ - 2 Score 0 0 0 0 0    Functional Status Survey: Is the patient deaf or have difficulty hearing?: Yes Does the patient have difficulty seeing, even when wearing glasses/contacts?: No Does the patient have difficulty concentrating, remembering, or making decisions?: No Does the patient have difficulty walking or climbing stairs?: No Does the patient have difficulty dressing or bathing?: No Does the patient have difficulty doing errands alone such as visiting a doctor's office or shopping?: No  MMSE - [place] Exam 11/28/2020 11/20/2019 11/17/2018  Orientation to time _0 Orientation to Place _1 Registration _2 Attention/ Calculation _3 Recall _4 Language- name 2 objects _5 Language- repeat _6 Language- follow 3 step command _7 Language- read & follow direction _8 Write a sentence _9 Copy design _10 Total score _11 Fall Risk  11/28/2020 04/16/2020 02/13/2020 12/14/2019 11/20/2019  Falls in the past year? 0 0 0 1 1  Number falls in past yr: - - - 0 0  Comment - - - - vertigo  Injury with Fall? - - - 1 0     Assessment/Plan: 1. Encounter for general adult medical examination with abnormal findings Annual health maintenance exam today.  Order slip given to check lipid and thyroid panels with next lab draw at [facility]  2. Essential hypertension Stable. Continue bp medication as prescribed   3. Mixed hyperlipidemia Continue simvastatin as prescribed. Will adjust dosing as indicated.   4. Multiple myeloma, remission status unspecified ([place]) Continue regular visits with [facility] oncology as scheduled.   5. Macular degeneration of left eye, unspecified type conitnue regular visits with ophthalmology as  scheduled.   6. Dysuria - UA/M w/rflx Culture, Routine  General Counseling: Sandy Salaam understanding of the findings of  todays visit and agrees with plan of treatment. I have discussed any further diagnostic evaluation that may be needed or ordered today. We also reviewed her medications today. she has been encouraged to call the office with any questions or concerns that should arise related to todays visit.    Counseling:  This patient was seen by Leretha Pol FNP Collaboration with Dr Lavera Guise as a part of collaborative care agreement  Orders Placed This Encounter  Procedures  . UA/M w/rflx Culture, Routine     Total time spent: 31 Minutes  Time spent includes review of chart, medications, test results, and follow up plan with the patient.     Lavera Guise, MD  Internal Medicine

## 2020-12-02 DIAGNOSIS — H40003 Preglaucoma, unspecified, bilateral: Secondary | ICD-10-CM | POA: Diagnosis not present

## 2020-12-03 ENCOUNTER — Other Ambulatory Visit: Payer: Self-pay | Admitting: Internal Medicine

## 2020-12-05 ENCOUNTER — Other Ambulatory Visit: Payer: Self-pay | Admitting: Nurse Practitioner

## 2020-12-05 ENCOUNTER — Other Ambulatory Visit: Payer: Self-pay

## 2020-12-05 DIAGNOSIS — N390 Urinary tract infection, site not specified: Secondary | ICD-10-CM

## 2020-12-05 LAB — UA/M W/RFLX CULTURE, ROUTINE
Bilirubin, UA: NEGATIVE
Glucose, UA: NEGATIVE
Nitrite, UA: NEGATIVE
Specific Gravity, UA: 1.025 (ref 1.005–1.030)
Urobilinogen, Ur: 0.2 mg/dL (ref 0.2–1.0)
pH, UA: 5 (ref 5.0–7.5)

## 2020-12-05 LAB — MICROSCOPIC EXAMINATION
Bacteria, UA: NONE SEEN
Casts: NONE SEEN /lpf

## 2020-12-05 LAB — URINE CULTURE, REFLEX

## 2020-12-05 MED ORDER — CIPROFLOXACIN HCL 500 MG PO TABS: ORAL_TABLET | ORAL | 0 refills | Status: DC

## 2020-12-05 MED ORDER — CIPROFLOXACIN HCL 250 MG PO TABS: 250.0000 mg | ORAL_TABLET | Freq: Two times a day (BID) | ORAL | 0 refills | Status: DC

## 2020-12-05 NOTE — Progress Notes (Signed)
Please let the patient know that her urine sample from physical showed uti. I have sent prescription for cipro 250mg  which is twice daily for next 7 days. I sent this to her pharmacy. Thanks.

## 2020-12-05 NOTE — Telephone Encounter (Signed)
Pt informed that she needs to follow up in 3 weeks to have urine checked.  I placed lab order for pt to go to [facility]

## 2020-12-17 DIAGNOSIS — Z5111 Encounter for antineoplastic chemotherapy: Secondary | ICD-10-CM | POA: Diagnosis not present

## 2020-12-17 DIAGNOSIS — C9001 Multiple myeloma in remission: Secondary | ICD-10-CM | POA: Diagnosis not present

## 2020-12-17 DIAGNOSIS — C9000 Multiple myeloma not having achieved remission: Secondary | ICD-10-CM | POA: Diagnosis not present

## 2020-12-17 DIAGNOSIS — R0600 Dyspnea, unspecified: Secondary | ICD-10-CM | POA: Diagnosis not present

## 2020-12-17 DIAGNOSIS — E785 Hyperlipidemia, unspecified: Secondary | ICD-10-CM | POA: Diagnosis not present

## 2020-12-17 DIAGNOSIS — E538 Deficiency of other specified B group vitamins: Secondary | ICD-10-CM | POA: Diagnosis not present

## 2020-12-26 DIAGNOSIS — N390 Urinary tract infection, site not specified: Secondary | ICD-10-CM | POA: Diagnosis not present

## 2020-12-31 LAB — CULTURE, URINE COMPREHENSIVE

## 2020-12-31 NOTE — Telephone Encounter (Signed)
Urine looks good, please advise

## 2021-01-07 DIAGNOSIS — H353221 Exudative age-related macular degeneration, left eye, with active choroidal neovascularization: Secondary | ICD-10-CM | POA: Diagnosis not present

## 2021-01-15 DIAGNOSIS — E041 Nontoxic single thyroid nodule: Secondary | ICD-10-CM | POA: Diagnosis not present

## 2021-01-15 DIAGNOSIS — H3530 Unspecified macular degeneration: Secondary | ICD-10-CM | POA: Diagnosis not present

## 2021-01-15 DIAGNOSIS — R0600 Dyspnea, unspecified: Secondary | ICD-10-CM | POA: Diagnosis not present

## 2021-01-15 DIAGNOSIS — C9001 Multiple myeloma in remission: Secondary | ICD-10-CM | POA: Diagnosis not present

## 2021-01-15 DIAGNOSIS — D61810 Antineoplastic chemotherapy induced pancytopenia: Secondary | ICD-10-CM | POA: Diagnosis not present

## 2021-01-15 DIAGNOSIS — F418 Other specified anxiety disorders: Secondary | ICD-10-CM | POA: Diagnosis not present

## 2021-01-16 DIAGNOSIS — C9001 Multiple myeloma in remission: Secondary | ICD-10-CM | POA: Diagnosis not present

## 2021-01-16 DIAGNOSIS — R0600 Dyspnea, unspecified: Secondary | ICD-10-CM | POA: Diagnosis not present

## 2021-01-16 DIAGNOSIS — C9000 Multiple myeloma not having achieved remission: Secondary | ICD-10-CM | POA: Diagnosis not present

## 2021-01-16 DIAGNOSIS — E538 Deficiency of other specified B group vitamins: Secondary | ICD-10-CM | POA: Diagnosis not present

## 2021-02-12 DIAGNOSIS — C9000 Multiple myeloma not having achieved remission: Secondary | ICD-10-CM | POA: Diagnosis not present

## 2021-02-12 DIAGNOSIS — Z5111 Encounter for antineoplastic chemotherapy: Secondary | ICD-10-CM | POA: Diagnosis not present

## 2021-02-12 DIAGNOSIS — R0600 Dyspnea, unspecified: Secondary | ICD-10-CM | POA: Diagnosis not present

## 2021-02-12 DIAGNOSIS — E538 Deficiency of other specified B group vitamins: Secondary | ICD-10-CM | POA: Diagnosis not present

## 2021-02-12 DIAGNOSIS — C9001 Multiple myeloma in remission: Secondary | ICD-10-CM | POA: Diagnosis not present

## 2021-02-12 DIAGNOSIS — Z5112 Encounter for antineoplastic immunotherapy: Secondary | ICD-10-CM | POA: Diagnosis not present

## 2021-02-17 ENCOUNTER — Other Ambulatory Visit: Payer: Self-pay | Admitting: Internal Medicine

## 2021-02-17 ENCOUNTER — Other Ambulatory Visit: Payer: Self-pay | Admitting: Nurse Practitioner

## 2021-02-17 DIAGNOSIS — I10 Essential (primary) hypertension: Secondary | ICD-10-CM

## 2021-02-17 DIAGNOSIS — Z1231 Encounter for screening mammogram for malignant neoplasm of breast: Secondary | ICD-10-CM

## 2021-02-24 DIAGNOSIS — H353221 Exudative age-related macular degeneration, left eye, with active choroidal neovascularization: Secondary | ICD-10-CM | POA: Diagnosis not present

## 2021-03-11 DIAGNOSIS — C9000 Multiple myeloma not having achieved remission: Secondary | ICD-10-CM | POA: Diagnosis not present

## 2021-03-11 DIAGNOSIS — C9001 Multiple myeloma in remission: Secondary | ICD-10-CM | POA: Diagnosis not present

## 2021-03-11 DIAGNOSIS — Z885 Allergy status to narcotic agent status: Secondary | ICD-10-CM | POA: Diagnosis not present

## 2021-03-11 DIAGNOSIS — E538 Deficiency of other specified B group vitamins: Secondary | ICD-10-CM | POA: Diagnosis not present

## 2021-03-11 DIAGNOSIS — D801 Nonfamilial hypogammaglobulinemia: Secondary | ICD-10-CM | POA: Diagnosis not present

## 2021-03-11 DIAGNOSIS — R109 Unspecified abdominal pain: Secondary | ICD-10-CM | POA: Diagnosis not present

## 2021-03-11 DIAGNOSIS — L989 Disorder of the skin and subcutaneous tissue, unspecified: Secondary | ICD-10-CM | POA: Diagnosis not present

## 2021-03-11 DIAGNOSIS — N281 Cyst of kidney, acquired: Secondary | ICD-10-CM | POA: Diagnosis not present

## 2021-03-11 DIAGNOSIS — Z5112 Encounter for antineoplastic immunotherapy: Secondary | ICD-10-CM | POA: Diagnosis not present

## 2021-03-11 DIAGNOSIS — R110 Nausea: Secondary | ICD-10-CM | POA: Diagnosis not present

## 2021-03-11 DIAGNOSIS — Z880 Allergy status to penicillin: Secondary | ICD-10-CM | POA: Diagnosis not present

## 2021-03-11 DIAGNOSIS — Z886 Allergy status to analgesic agent status: Secondary | ICD-10-CM | POA: Diagnosis not present

## 2021-03-11 DIAGNOSIS — M8588 Other specified disorders of bone density and structure, other site: Secondary | ICD-10-CM | POA: Diagnosis not present

## 2021-03-11 DIAGNOSIS — N261 Atrophy of kidney (terminal): Secondary | ICD-10-CM | POA: Diagnosis not present

## 2021-03-11 DIAGNOSIS — F32A Depression, unspecified: Secondary | ICD-10-CM | POA: Diagnosis not present

## 2021-03-11 DIAGNOSIS — Z79899 Other long term (current) drug therapy: Secondary | ICD-10-CM | POA: Diagnosis not present

## 2021-03-11 DIAGNOSIS — Z882 Allergy status to sulfonamides status: Secondary | ICD-10-CM | POA: Diagnosis not present

## 2021-03-11 DIAGNOSIS — F419 Anxiety disorder, unspecified: Secondary | ICD-10-CM | POA: Diagnosis not present

## 2021-03-11 DIAGNOSIS — Z91041 Radiographic dye allergy status: Secondary | ICD-10-CM | POA: Diagnosis not present

## 2021-03-11 DIAGNOSIS — H3530 Unspecified macular degeneration: Secondary | ICD-10-CM | POA: Diagnosis not present

## 2021-03-11 DIAGNOSIS — E041 Nontoxic single thyroid nodule: Secondary | ICD-10-CM | POA: Diagnosis not present

## 2021-03-11 DIAGNOSIS — R0600 Dyspnea, unspecified: Secondary | ICD-10-CM | POA: Diagnosis not present

## 2021-03-11 DIAGNOSIS — D61810 Antineoplastic chemotherapy induced pancytopenia: Secondary | ICD-10-CM | POA: Diagnosis not present

## 2021-03-19 ENCOUNTER — Telehealth: Payer: Self-pay | Admitting: Internal Medicine

## 2021-03-19 NOTE — Progress Notes (Signed)
  Chronic Care Management   Note  03/19/2021 Name: Sherry Oneal MRN: [id number] DOB: Nov 09, 1946  Sherry Oneal is a 75 y.o. year old female who is a primary care patient of Lavera Guise, MD. I reached out to Pauline Aus by phone today in response to a referral sent by [name]'s PCP, Lavera Guise, MD.   [name] was given information about Chronic Care Management services today including:  1. CCM service includes personalized support from designated clinical staff supervised by her physician, including individualized plan of care and coordination with other care providers 2. 24/7 contact phone numbers for assistance for urgent and routine care needs. 3. Service will only be billed when office clinical staff spend 20 minutes or more in a month to coordinate care. 4. Only one practitioner may furnish and bill the service in a calendar month. 5. The patient may stop CCM services at any time (effective at the end of the month) by phone call to the office staff.   Patient agreed to services and verbal consent obtained.   Follow up plan:   Carley Perdue [employer] Scheduler

## 2021-03-21 ENCOUNTER — Ambulatory Visit (INDEPENDENT_AMBULATORY_CARE_PROVIDER_SITE_OTHER): Payer: PPO | Admitting: Hospice and Palliative Medicine

## 2021-03-21 ENCOUNTER — Encounter: Payer: Self-pay | Admitting: Hospice and Palliative Medicine

## 2021-03-21 VITALS — Temp 98.4°F | Ht 64.0 in | Wt 136.0 lb

## 2021-03-21 DIAGNOSIS — J029 Acute pharyngitis, unspecified: Secondary | ICD-10-CM

## 2021-03-21 DIAGNOSIS — J0140 Acute pansinusitis, unspecified: Secondary | ICD-10-CM | POA: Diagnosis not present

## 2021-03-21 MED ORDER — AZITHROMYCIN 250 MG PO TABS: ORAL_TABLET | ORAL | 0 refills | Status: DC

## 2021-03-21 NOTE — Progress Notes (Signed)
[facility] [place], [place] [address]  Internal MEDICINE  Telephone Visit  Patient Name: Sherry Oneal  [id number]  [id number]  Date of Service: 03/24/2021  I connected with the patient at [phone] by telephone and verified the patients identity using two identifiers.   I discussed the limitations, risks, security and privacy concerns of performing an evaluation and management service by telephone and the availability of in person appointments. I also discussed with the patient that there may be a patient responsible charge related to the service.  The patient expressed understanding and agrees to proceed.    Chief Complaint  Patient presents with  . Telephone Assessment    [phone]  . Telephone Screen    Home Covid test is negative pt said she gets this every year   . Sinusitis  . Sore Throat  . Cough  . Headache    HPI Patient is being seen virtually today for acute sick visit Complaining of sinus pain and pressure, headache, sore throat and dry cough Symptoms started earlier this week and each day have progressed Has been taking OTC cold and sinus medication without relief in her symptoms Denies fevers, body aches, shortness of breath or wheezing Coughing is mild, most concerning symptoms is congestion and sore throat  Home COVID testing negative   Current Medication: Outpatient Encounter Medications as of 03/21/2021  Medication Sig  . acetaminophen (TYLENOL) 500 MG tablet Take 500 mg by mouth every 4 (four) hours as needed.   Marland Kitchen albuterol (PROVENTIL HFA;VENTOLIN HFA) 108 (90 Base) MCG/ACT inhaler Inhale 2 puffs into the lungs every 6 (six) hours as needed for wheezing or shortness of breath.  . ALPRAZolam (XANAX) 0.25 MG tablet Take 1 tablet (0.25 mg total) by mouth at bedtime as needed.  Marland Kitchen atorvastatin (LIPITOR) 10 MG tablet Take 1 tablet by mouth once daily  . azithromycin (ZITHROMAX Z-PAK) 250 MG tablet Take two 250 mg tablets on day one  followed by one 250 mg tablet each day for four days.  . Calcium Carbonate-Vitamin D 600-400 MG-UNIT tablet Take 1 tablet by mouth daily.  . cholecalciferol (VITAMIN D3) 10 MCG (400 UNIT) TABS tablet Take 400 Units by mouth daily.  . Cranberry 500 MG CAPS Take 500 mg by mouth every morning.  Marland Kitchen DARATUMUMAB IV Inject into the vein every 30 (thirty) days.  Marland Kitchen dexamethasone (DECADRON) 6 MG tablet Take 12 mg by mouth every 30 (thirty) days.   Marland Kitchen dicyclomine (BENTYL) 10 MG capsule TAKE 1 CAPSULE BY MOUTH THREE TIMES DAILY AS NEEDED FOR  SPASMS.  TAKE  WITH  MEALS  . diphenhydrAMINE (BENADRYL) 25 MG tablet Take 25 mg by mouth daily as needed.  . docusate (COLACE) 50 MG/5ML liquid Take by mouth daily as needed for mild constipation.  Marland Kitchen ELIQUIS 5 MG TABS tablet Take 1 tablet by mouth 2 (two) times daily.  . fluticasone (FLONASE) 50 MCG/ACT nasal spray Place into both nostrils daily.  Marland Kitchen ipratropium-albuterol (DUONEB) 0.5-2.5 (3) MG/3ML SOLN Take 3 mLs by nebulization every 6 (six) hours as needed.  Marland Kitchen lisinopril (ZESTRIL) 5 MG tablet Take 1 tablet by mouth once daily  . meclizine (ANTIVERT) 25 MG tablet Take 25 mg by mouth as needed for dizziness.  . Multiple Vitamins-Minerals (PRESERVISION AREDS PO) Take 1 tablet by mouth every morning.  Marland Kitchen omeprazole (PRILOSEC) 20 MG capsule Take 20 mg by mouth daily.  . ondansetron (ZOFRAN-ODT) 4 MG disintegrating tablet DISSOLVE 1 TABLET IN MOUTH EVERY 8 HOURS  AS NEEDED FOR NAUSEA  . polyethylene glycol (MIRALAX / GLYCOLAX) packet Take 17 g by mouth daily as needed.  . pomalidomide (POMALYST) 4 MG capsule Take 1 capsule by mouth once daily for days 1-21. 7 days rest.  . prochlorperazine (COMPAZINE) 5 MG tablet Take 5 mg by mouth every 8 (eight) hours as needed.  . promethazine (PHENERGAN) 25 MG tablet Take 1 tablet (25 mg total) by mouth every 6 (six) hours as needed for nausea or vomiting.  . senna (SENOKOT) 8.6 MG tablet Take 1 tablet by mouth daily as needed for  constipation.  . valACYclovir (VALTREX) 500 MG tablet Take 500 mg by mouth daily.  . [DISCONTINUED] ciprofloxacin (CIPRO) 500 MG tablet Take 1 tablet by mouth twice a day for 5 days   No facility-administered encounter medications on file as of 03/21/2021.    Surgical History: Past Surgical History:  Procedure Laterality Date  . CATARACT EXTRACTION W/PHACO Right 08/17/2018   Procedure: CATARACT EXTRACTION PHACO AND INTRAOCULAR LENS PLACEMENT ([place])  RIGHT;  Surgeon: Leandrew Koyanagi, MD;  Location: [place];  Service: Ophthalmology;  Laterality: Right;  . CATARACT EXTRACTION W/PHACO Left 11/09/2018   Procedure: CATARACT EXTRACTION PHACO AND INTRAOCULAR LENS PLACEMENT ([place]) LEFT;  Surgeon: Leandrew Koyanagi, MD;  Location: [place];  Service: Ophthalmology;  Laterality: Left;  . EXPLORATORY LAPAROTOMY      Medical History: Past Medical History:  Diagnosis Date  . Anemia   . Arthritis    lower back  . High blood pressure   . High cholesterol   . Macular degeneration    recieving 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   . Multiple myeloma ([place])   . Renal insufficiency   . Wears dentures    partial upper    Family History: Family History  Problem Relation Age of Onset  . Breast cancer Maternal Grandmother 1  . CAD Mother   . CVA Mother     Social History   Socioeconomic History  . Marital status: Married    Spouse name: Not on file  . Number of children: 1  . Years of education: Not on file  . Highest education level: Not on file  Occupational History  . Not on file  Tobacco Use  . Smoking status: Never Smoker  . Smokeless tobacco: Never Used  Vaping Use  . Vaping Use: Never used  Substance and Sexual Activity  . Alcohol use: No  . Drug use: No  . Sexual activity: Not Currently    Birth control/protection: None  Other Topics Concern  . Not on file  Social History Narrative   Lives at home with her husband,  independent at baseline   Social Determinants of Health   Financial Resource Strain: Not on file  Food Insecurity: Not on file  Transportation Needs: Not on file  Physical Activity: Not on file  Stress: Not on file  Social Connections: Not on file  Intimate Partner Violence: Not on file      Review of Systems  Constitutional: Negative for chills, diaphoresis and fatigue.  HENT: Positive for sinus pressure, sinus pain and sore throat. Negative for ear pain and postnasal drip.   Eyes: Negative for photophobia, discharge, redness, itching and visual disturbance.  Respiratory: Positive for cough. Negative for shortness of breath and wheezing.   Cardiovascular: Negative for chest pain, palpitations and leg swelling.  Gastrointestinal: Negative for abdominal pain, constipation, diarrhea, nausea and vomiting.  Genitourinary: Negative for dysuria and  flank pain.  Musculoskeletal: Negative for arthralgias, back pain, gait problem and neck pain.  Skin: Negative for color change.  Allergic/Immunologic: Negative for environmental allergies and food allergies.  Neurological: Negative for dizziness and headaches.  Hematological: Does not bruise/bleed easily.  Psychiatric/Behavioral: Negative for agitation, behavioral problems (depression) and hallucinations.    Vital Signs: Temp 98.4 F (36.9 C)   Ht 5' 4"  (1.626 m)   Wt 136 lb (61.7 kg)   BMI 23.34 kg/m    Observation/Objective: Alert and oriented, well appearing, answers questions appropriately.  Assessment/Plan: 1. Acute non-recurrent pansinusitis Start ZPAK, encouraged to take OTC Mucinex, rest and increase fluid intake Advised to contact office if symptoms worsen or have not imporved within 1 week May use albuterol inhaler or nebulizer as needed for cough or if shortness of breath or wheezing develop - azithromycin (ZITHROMAX Z-PAK) 250 MG tablet; Take two 250 mg tablets on day one followed by one 250 mg tablet each day for  four days.  Dispense: 6 each; Refill: 0  2. Sore throat Likely viral in etiology, encouraged to drink warm liquids and may use throat lozenges for symptom relief  General Counseling: Sandy Salaam understanding of the findings of today's phone visit and agrees with plan of treatment. I have discussed any further diagnostic evaluation that may be needed or ordered today. We also reviewed her medications today. she has been encouraged to call the office with any questions or concerns that should arise related to todays visit.   Meds ordered this encounter  Medications  . azithromycin (ZITHROMAX Z-PAK) 250 MG tablet    Sig: Take two 250 mg tablets on day one followed by one 250 mg tablet each day for four days.    Dispense:  6 each    Refill:  0     Time spent: 25 Minutes Time spent includes review of chart, medications, test results and follow-up plan with the patient.  Tanna Furry Daundre Biel AGNP-C Internal medicine

## 2021-03-23 ENCOUNTER — Other Ambulatory Visit: Payer: Self-pay

## 2021-03-23 ENCOUNTER — Emergency Department
Admission: EM | Admit: 2021-03-23 | Discharge: 2021-03-23 | Disposition: A | Discharge status: Home / Self Care | Payer: PPO | Attending: Emergency Medicine | Admitting: Emergency Medicine

## 2021-03-23 ENCOUNTER — Emergency Department: Payer: PPO

## 2021-03-23 DIAGNOSIS — B349 Viral infection, unspecified: Secondary | ICD-10-CM

## 2021-03-23 DIAGNOSIS — R0602 Shortness of breath: Secondary | ICD-10-CM | POA: Insufficient documentation

## 2021-03-23 DIAGNOSIS — Z79899 Other long term (current) drug therapy: Secondary | ICD-10-CM | POA: Diagnosis not present

## 2021-03-23 DIAGNOSIS — I10 Essential (primary) hypertension: Secondary | ICD-10-CM | POA: Diagnosis not present

## 2021-03-23 DIAGNOSIS — R0789 Other chest pain: Secondary | ICD-10-CM | POA: Insufficient documentation

## 2021-03-23 DIAGNOSIS — Z7901 Long term (current) use of anticoagulants: Secondary | ICD-10-CM | POA: Insufficient documentation

## 2021-03-23 DIAGNOSIS — R059 Cough, unspecified: Secondary | ICD-10-CM

## 2021-03-23 DIAGNOSIS — Z20822 Contact with and (suspected) exposure to covid-19: Secondary | ICD-10-CM | POA: Diagnosis not present

## 2021-03-23 DIAGNOSIS — R079 Chest pain, unspecified: Secondary | ICD-10-CM | POA: Diagnosis not present

## 2021-03-23 LAB — RESP PANEL BY RT-PCR (FLU A&B, COVID) ARPGX2
Influenza A by PCR: NEGATIVE
Influenza B by PCR: NEGATIVE
SARS Coronavirus 2 by RT PCR: NEGATIVE

## 2021-03-23 LAB — BASIC METABOLIC PANEL
Anion gap: 9 (ref 5–15)
BUN: 12 mg/dL (ref 8–23)
CO2: 24 mmol/L (ref 22–32)
Calcium: 8.8 mg/dL — ABNORMAL LOW (ref 8.9–10.3)
Chloride: 107 mmol/L (ref 98–111)
Creatinine, Ser: 0.87 mg/dL (ref 0.44–1.00)
GFR, Estimated: >60 mL/min (ref >60)
Glucose, Bld: 97 mg/dL (ref 70–99)
Potassium: 3.8 mmol/L (ref 3.5–5.1)
Sodium: 140 mmol/L (ref 135–145)

## 2021-03-23 LAB — CBC
HCT: 37.8 % (ref 36.0–46.0)
Hemoglobin: 12.3 g/dL (ref 12.0–15.0)
MCH: 29.9 pg (ref 26.0–34.0)
MCHC: 32.5 g/dL (ref 30.0–36.0)
MCV: 91.7 fL (ref 80.0–100.0)
Platelets: 170 10*3/uL (ref 150–400)
RBC: 4.12 MIL/uL (ref 3.87–5.11)
RDW: 15.4 % (ref 11.5–15.5)
WBC: 7.2 10*3/uL (ref 4.0–10.5)
nRBC: 0 % (ref 0.0–0.2)

## 2021-03-23 LAB — BRAIN NATRIURETIC PEPTIDE: B Natriuretic Peptide: 40.1 pg/mL (ref 0.0–100.0)

## 2021-03-23 LAB — TROPONIN I (HIGH SENSITIVITY): Troponin I (High Sensitivity): 6 ng/L (ref <18)

## 2021-03-23 MED ORDER — GUAIFENESIN-CODEINE 100-10 MG/5ML PO SOLN
5.0000 mL | Freq: Once | ORAL | Status: AC
  Administered 2021-03-23: 5 mL via ORAL
  Filled 2021-03-23: qty 5

## 2021-03-23 MED ORDER — GUAIFENESIN-CODEINE 100-10 MG/5ML PO SOLN: 5.0000 mL | Freq: Three times a day (TID) | ORAL | 0 refills | Status: DC | PRN

## 2021-03-23 MED ORDER — BENZONATATE 100 MG PO CAPS
200.0000 mg | ORAL_CAPSULE | Freq: Once | ORAL | Status: AC
  Administered 2021-03-23: 200 mg via ORAL
  Filled 2021-03-23: qty 2

## 2021-03-23 MED ORDER — BENZONATATE 100 MG PO CAPS: 200.0000 mg | ORAL_CAPSULE | Freq: Three times a day (TID) | ORAL | 0 refills | Status: AC | PRN

## 2021-03-23 NOTE — ED Triage Notes (Addendum)
Pt via POV from home. Pt c/o dry cough and SOB since Thursday, pt called her PCP on Friday they prescribed her a Z-Pack and the cough and SOB worse. Pt states sx are continuously getting worse. Pt is A&Ox4 and NAD. Denies pain. Pt has a hx of multiple myeloma.

## 2021-03-23 NOTE — ED Provider Notes (Signed)
[facility] Emergency Department Provider Note  ____________________________________________   Event Date/Time   First MD Initiated Contact with Patient 03/23/21 1936     (approximate)  I have reviewed the triage vital signs and the nursing notes.   HISTORY  Chief Complaint Cough and Shortness of Breath    HPI Sherry Oneal is a 75 y.o. female with multiple myeloma who comes in for cough and shortness of breath since Thursday.  Patient is been on a Z-Pak and the cough or shortness of breath has gotten worse.  Patient states that this has been going on since Thursday initially started off as some congestion, postnasal drip and cough but then is not really getting better with the Z-Pak.  She does not have very much shortness of breath at rest but when she is coughing she feels more short of breath, moderate, worse with coughing, better at rest.  She reports a little bit of chest discomfort with coughing.  No leg swelling.  She states that her husband is also very sick with similar symptoms.  Patient had a COVID shots and is taking an at-home Covid test has been negative does not been tested for the flu.            Past Medical History:  Diagnosis Date  . Anemia   . Arthritis    lower back  . High blood pressure   . High cholesterol   . Macular degeneration    recieving 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   . Multiple myeloma ([place])   . Renal insufficiency   . Wears dentures    partial upper    Patient Active Problem List   Diagnosis Date Noted  . Macular degeneration of left eye 11/28/2020  . Encounter for general adult medical examination with abnormal findings 11/20/2019  . Acute colitis 08/03/2019  . Screening for breast cancer 11/17/2018  . Dysuria 11/17/2018  . Acute pharyngitis 08/26/2018  . Sore throat 08/26/2018  . Generalized anxiety disorder 06/13/2018  . HCAP (healthcare-associated pneumonia) 03/22/2018   . Acute bronchitis 03/01/2018  . Cough 03/01/2018  . Flu-like symptoms 03/01/2018  . Essential hypertension 03/01/2018  . Hypogammaglobulinemia, acquired ([place]) 01/11/2018  . Exertional dyspnea 09/03/2017  . Pancytopenia due to chemotherapy ([place]) 05/18/2017  . LGI bleed 01/30/2017  . Acute blood loss anemia 01/30/2017  . Abdominal pain 01/30/2017  . Multiple myeloma ([place]) 01/30/2017  . Mixed hyperlipidemia 10/29/2016  . Nephrolithiasis 04/04/2013  . 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 CATARACT EXTRACTION W/PHACO Right 08/17/2018   Procedure: CATARACT EXTRACTION PHACO AND INTRAOCULAR LENS PLACEMENT ([place])  RIGHT;  Surgeon: Leandrew Koyanagi, MD;  Location: [place];  Service: Ophthalmology;  Laterality: Right;  . CATARACT EXTRACTION W/PHACO Left 11/09/2018   Procedure: CATARACT EXTRACTION PHACO AND INTRAOCULAR LENS PLACEMENT ([place]) LEFT;  Surgeon: Leandrew Koyanagi, MD;  Location: [place];  Service: Ophthalmology;  Laterality: Left;  . EXPLORATORY LAPAROTOMY      Prior to Admission medications   Medication Sig Start Date End Date Taking? Authorizing Provider  acetaminophen (TYLENOL) 500 MG tablet Take 500 mg by mouth every 4 (four) hours as needed.     [provider]  albuterol (PROVENTIL HFA;VENTOLIN HFA) 108 (90 Base) MCG/ACT inhaler Inhale 2 puffs into the lungs every 6 (six) hours as needed for wheezing or shortness of breath. 03/24/18   Hillary Bow, MD  ALPRAZolam (XANAX) 0.25 MG tablet Take 1 tablet (0.25 mg total) by mouth at bedtime as needed. 02/13/20   Kendell Bane, NP  atorvastatin (LIPITOR) 10 MG tablet Take 1 tablet by mouth once daily 02/17/21   Lavera Guise, MD  azithromycin (ZITHROMAX Z-PAK) 250 MG tablet Take two 250 mg tablets on day one followed by one 250 mg tablet each day for four days. 03/21/21   Luiz Ochoa, NP  Calcium Carbonate-Vitamin D 600-400  MG-UNIT tablet Take 1 tablet by mouth daily.    [provider]  cholecalciferol (VITAMIN D3) 10 MCG (400 UNIT) TABS tablet Take 400 Units by mouth daily.    [provider]  Cranberry 500 MG CAPS Take 500 mg by mouth every morning.    [provider]  DARATUMUMAB IV Inject into the vein every 30 (thirty) days.    [provider]  dexamethasone (DECADRON) 6 MG tablet Take 12 mg by mouth every 30 (thirty) days.     [provider]  dicyclomine (BENTYL) 10 MG capsule TAKE 1 CAPSULE BY MOUTH THREE TIMES DAILY AS NEEDED FOR  SPASMS.  TAKE  WITH  MEALS 09/09/20   Lavera Guise, MD  diphenhydrAMINE (BENADRYL) 25 MG tablet Take 25 mg by mouth daily as needed.    [provider]  docusate (COLACE) 50 MG/5ML liquid Take by mouth daily as needed for mild constipation.    [provider]  ELIQUIS 5 MG TABS tablet Take 1 tablet by mouth 2 (two) times daily. 02/21/18   [provider]  fluticasone (FLONASE) 50 MCG/ACT nasal spray Place into both nostrils daily.    [provider]  ipratropium-albuterol (DUONEB) 0.5-2.5 (3) MG/3ML SOLN Take 3 mLs by nebulization every 6 (six) hours as needed. 03/14/19   Kendell Bane, NP  lisinopril (ZESTRIL) 5 MG tablet Take 1 tablet by mouth once daily 02/17/21   Lavera Guise, MD  meclizine (ANTIVERT) 25 MG tablet Take 25 mg by mouth as needed for dizziness.    [provider]  Multiple Vitamins-Minerals (PRESERVISION AREDS PO) Take 1 tablet by mouth every morning.    [provider]  omeprazole (PRILOSEC) 20 MG capsule Take 20 mg by mouth daily. 01/20/17   [provider]  ondansetron (ZOFRAN-ODT) 4 MG disintegrating tablet DISSOLVE 1 TABLET IN MOUTH EVERY 8 HOURS AS NEEDED FOR NAUSEA 12/20/17   [provider]  polyethylene glycol (MIRALAX / GLYCOLAX) packet Take 17 g by mouth daily as needed.    [provider]  pomalidomide (POMALYST) 4 MG capsule  Take 1 capsule by mouth once daily for days 1-21. 7 days rest. 02/16/18   [provider]  prochlorperazine (COMPAZINE) 5 MG tablet Take 5 mg by mouth every 8 (eight) hours as needed. 12/04/16   [provider]  promethazine (PHENERGAN) 25 MG tablet Take 1 tablet (25 mg total) by mouth every 6 (six) hours as needed for nausea or vomiting. 04/20/20   Harvest Dark, MD  senna (SENOKOT) 8.6 MG tablet Take 1 tablet by mouth daily as needed for constipation.    [provider]  valACYclovir (VALTREX) 500 MG tablet Take 500 mg by mouth daily. 01/20/17   [provider]    Allergies Aspirin, Atenolol, Doxycycline, Epinephrine, Ivp dye [iodinated diagnostic agents], Ketorolac, Nsaids, Penicillins, Sulfa antibiotics, Tolmetin, Tramadol, and Procaine  Family History  Problem Relation Age of Onset  . Breast cancer Maternal Grandmother 82  . CAD Mother   .  CVA Mother     Social History Social History   Tobacco Use  . Smoking status: Never Smoker  . Smokeless tobacco: Never Used  Vaping Use  . Vaping Use: Never used  Substance Use Topics  . Alcohol use: No  . Drug use: No      Review of Systems Constitutional: No fever/chills Eyes: No visual changes. ENT: No sore throat. Cardiovascular: No chest pain Respiratory: Positive for SOB, cough, congestion Gastrointestinal: No abdominal pain.  No nausea, no vomiting.  No diarrhea.  No constipation. Genitourinary: Negative for dysuria. Musculoskeletal: Negative for back pain. Skin: Negative for rash. Neurological: Negative for headaches, focal weakness or numbness. All other ROS negative ____________________________________________   PHYSICAL EXAM:  VITAL SIGNS: ED Triage Vitals  Enc Vitals Group     BP 03/23/21 1817 (!) 159/58     Pulse Rate 03/23/21 1817 74     Resp 03/23/21 1817 20     Temp 03/23/21 1817 97.9 F (36.6 C)     Temp Source 03/23/21 1817 Oral     SpO2 03/23/21 1817 97 %      Weight 03/23/21 1814 137 lb (62.1 kg)     Height 03/23/21 1814 5' 4.5" (1.638 m)     Head Circumference --      Peak Flow --      Pain Score 03/23/21 1814 0     Pain Loc --      Pain Edu? --      Excl. in [place]? --     Constitutional: Alert and oriented. Well appearing and in no acute distress. Eyes: Conjunctivae are normal. EOMI. Head: Atraumatic. Nose: No congestion/rhinnorhea. Mouth/Throat: Mucous membranes are moist.   Neck: No stridor. Trachea Midline. FROM Cardiovascular: Normal rate, regular rhythm. Grossly normal heart sounds.  Good peripheral circulation. Respiratory: Clear lungs bilaterally, no increased work of breathing Gastrointestinal: Soft and nontender. No distention. No abdominal bruits.  Musculoskeletal: No lower extremity tenderness nor edema.  No joint effusions. Neurologic:  Normal speech and language. No gross focal neurologic deficits are appreciated.  Skin:  Skin is warm, dry and intact. No rash noted. Psychiatric: Mood and affect are normal. Speech and behavior are normal. GU: Deferred   ____________________________________________   LABS (all labs ordered are listed, but only abnormal results are displayed)  Labs Reviewed  BASIC METABOLIC PANEL - Abnormal; Notable for the following components:      Result Value   Calcium 8.8 (*)    All other components within normal limits  RESP PANEL BY RT-PCR (FLU A&B, COVID) ARPGX2  CBC  BRAIN NATRIURETIC PEPTIDE  TROPONIN I (HIGH SENSITIVITY)   ____________________________________________   ED ECG REPORT I, Vanessa Murray, the attending physician, personally viewed and interpreted this ECG.  Normal sinus rate of 70, no ST elevation, no T wave inversion, normal intervals ____________________________________________  RADIOLOGY Robert Bellow, personally viewed and evaluated these images (plain radiographs) as part of my medical decision making, as well as reviewing the written report by the radiologist.  ED  MD interpretation: No pneumonia  Official radiology report(s): DG Chest 2 View  Result Date: 03/23/2021 CLINICAL DATA:  Chest pain, shortness of breath dry cough. EXAM: CHEST - 2 VIEW COMPARISON:  Chest radiograph May 20, 2018 FINDINGS: The heart size and mediastinal contours are within normal limits. Aortic atherosclerosis. Pulmonary hyperinflation with chronic interstitial changes appear similar to prior. No focal consolidation. No pleural effusion. No pneumothorax. Thoracic spondylosis. Degenerative changes bilateral shoulders and acromioclavicular joints.  IMPRESSION: 1. No acute cardiopulmonary disease. 2. Stable hyperinflation and chronic interstitial changes. Electronically Signed   By: Dahlia Bailiff MD   On: 03/23/2021 19:00    ____________________________________________   PROCEDURES  Procedure(s) performed (including Critical Care):  Procedures   ____________________________________________   INITIAL IMPRESSION / ASSESSMENT AND PLAN / ED COURSE   MEYER DOCKERY was evaluated in Emergency Department on 03/23/2021 for the symptoms described in the history of present illness. She was evaluated in the context of the global COVID-19 pandemic, which necessitated consideration that the patient might be at risk for infection with the SARS-CoV-2 virus that causes COVID-19. Institutional protocols and algorithms that pertain to the evaluation of patients at risk for COVID-19 are in a state of rapid change based on information released by regulatory bodies including the CDC and federal and state organizations. These policies and algorithms were followed during the patient's care in the ED.     Pt presents with SOB. Differential includes: PNA-will get xray to evaluation Anemia-CBC to evaluate ACS- will get trops Arrhythmia-Will get EKG  COVID- will get testing per algorithm. PE-lower suspicion given no risk factors and other cause more likely.   Work-up is reassuring.  Chest x-ray  without pneumonia and her cardiac markers and BNP are negative therefore unlikely to be ACS.  I considered the possibility of PE but given the symptoms that patient is having seems very unlikely and her husband has similar symptoms it seems to be more likely a viral process.  At this time I do not feel a CT PE is necessary given reassuring work-up and patient also has contrast allergy.    Her Covid swab was negative for flu and Covid.  Patient's biggest concern is the cough.  We discussed different options but will send her home on Tessalon Perles and some codeine cough syrup.  We discussed her codeine cough syrup can be dangerous in the elderly how she should take it at nighttime only patient expressed understanding.  Her husband going to pick her up and she is requesting a dose before leaving.  Patient has normal ambulatory sat  I discussed the provisional nature of ED diagnosis, the treatment so far, the ongoing plan of care, follow up appointments and return precautions with the patient and any family or support people present. They expressed understanding and agreed with the plan, discharged home.          ____________________________________________   FINAL CLINICAL IMPRESSION(S) / ED DIAGNOSES   Final diagnoses:  Cough  Viral illness     MEDICATIONS GIVEN DURING THIS VISIT:  Medications  benzonatate (TESSALON) capsule 200 mg (200 mg Oral Given 03/23/21 2120)  guaiFENesin-codeine 100-10 MG/5ML solution 5 mL (5 mLs Oral Given 03/23/21 2121)     ED Discharge Orders         Ordered    benzonatate (TESSALON PERLES) 100 MG capsule  3 times daily PRN        03/23/21 2125    guaiFENesin-codeine 100-10 MG/5ML syrup  3 times daily PRN        03/23/21 2125           Note:  This document was prepared using Dragon voice recognition software and may include unintentional dictation errors.   Vanessa Superior, MD 03/23/21 2128

## 2021-03-23 NOTE — Discharge Instructions (Addendum)
I suspect this is most likely a viral illness given you and your husband are sick.  Your Covid swab was negative and your flu swab was negative.  You can take the Tessalon Perles and codeine cough syrup to help with the cough.  Take the cough syrup initially at nighttime to make sure it does not cause you to be drowsy.  Do not drive while on this.  Return the ER if you develop worsening shortness of breath or any other concern

## 2021-03-23 NOTE — ED Notes (Signed)
Ambulatory pulse ox 95/96-98%

## 2021-03-24 ENCOUNTER — Encounter: Payer: Self-pay | Admitting: Hospice and Palliative Medicine

## 2021-04-04 ENCOUNTER — Other Ambulatory Visit: Payer: Self-pay

## 2021-04-04 ENCOUNTER — Ambulatory Visit
Admission: RE | Admit: 2021-04-04 | Discharge: 2021-04-04 | Disposition: A | Discharge status: Home / Self Care | Payer: PPO | Source: Ambulatory Visit | Attending: Nurse Practitioner | Admitting: Nurse Practitioner

## 2021-04-04 DIAGNOSIS — Z1231 Encounter for screening mammogram for malignant neoplasm of breast: Secondary | ICD-10-CM | POA: Insufficient documentation

## 2021-04-07 NOTE — Progress Notes (Signed)
Normal

## 2021-04-09 DIAGNOSIS — C9000 Multiple myeloma not having achieved remission: Secondary | ICD-10-CM | POA: Diagnosis not present

## 2021-04-09 DIAGNOSIS — R0600 Dyspnea, unspecified: Secondary | ICD-10-CM | POA: Diagnosis not present

## 2021-04-09 DIAGNOSIS — C9001 Multiple myeloma in remission: Secondary | ICD-10-CM | POA: Diagnosis not present

## 2021-04-09 DIAGNOSIS — Z5112 Encounter for antineoplastic immunotherapy: Secondary | ICD-10-CM | POA: Diagnosis not present

## 2021-04-10 ENCOUNTER — Telehealth: Payer: Self-pay | Admitting: Pharmacist

## 2021-04-10 NOTE — Progress Notes (Addendum)
Chronic Care Management Pharmacy Assistant   Name: Sherry Oneal  MRN: [id number] DOB: [birth year]  Reason for Encounter: Initial Questions  Medications: Outpatient Encounter Medications as of 04/10/2021  Medication Sig   acetaminophen (TYLENOL) 500 MG tablet Take 500 mg by mouth every 4 (four) hours as needed.    albuterol (PROVENTIL HFA;VENTOLIN HFA) 108 (90 Base) MCG/ACT inhaler Inhale 2 puffs into the lungs every 6 (six) hours as needed for wheezing or shortness of breath.   ALPRAZolam (XANAX) 0.25 MG tablet Take 1 tablet (0.25 mg total) by mouth at bedtime as needed.   atorvastatin (LIPITOR) 10 MG tablet Take 1 tablet by mouth once daily   azithromycin (ZITHROMAX Z-PAK) 250 MG tablet Take two 250 mg tablets on day one followed by one 250 mg tablet each day for four days.   Calcium Carbonate-Vitamin D 600-400 MG-UNIT tablet Take 1 tablet by mouth daily.   cholecalciferol (VITAMIN D3) 10 MCG (400 UNIT) TABS tablet Take 400 Units by mouth daily.   Cranberry 500 MG CAPS Take 500 mg by mouth every morning.   DARATUMUMAB IV Inject into the vein every 30 (thirty) days.   dexamethasone (DECADRON) 6 MG tablet Take 12 mg by mouth every 30 (thirty) days.    dicyclomine (BENTYL) 10 MG capsule TAKE 1 CAPSULE BY MOUTH THREE TIMES DAILY AS NEEDED FOR  SPASMS.  TAKE  WITH  MEALS   diphenhydrAMINE (BENADRYL) 25 MG tablet Take 25 mg by mouth daily as needed.   docusate (COLACE) 50 MG/5ML liquid Take by mouth daily as needed for mild constipation.   ELIQUIS 5 MG TABS tablet Take 1 tablet by mouth 2 (two) times daily.   fluticasone (FLONASE) 50 MCG/ACT nasal spray Place into both nostrils daily.   guaiFENesin-codeine 100-10 MG/5ML syrup Take 5 mLs by mouth 3 (three) times daily as needed for cough.   ipratropium-albuterol (DUONEB) 0.5-2.5 (3) MG/3ML SOLN Take 3 mLs by nebulization every 6 (six) hours as needed.   lisinopril (ZESTRIL) 5 MG tablet Take 1 tablet by mouth once daily   meclizine (ANTIVERT)  25 MG tablet Take 25 mg by mouth as needed for dizziness.   Multiple Vitamins-Minerals (PRESERVISION AREDS PO) Take 1 tablet by mouth every morning.   omeprazole (PRILOSEC) 20 MG capsule Take 20 mg by mouth daily.   ondansetron (ZOFRAN-ODT) 4 MG disintegrating tablet DISSOLVE 1 TABLET IN MOUTH EVERY 8 HOURS AS NEEDED FOR NAUSEA   polyethylene glycol (MIRALAX / GLYCOLAX) packet Take 17 g by mouth daily as needed.   pomalidomide (POMALYST) 4 MG capsule Take 1 capsule by mouth once daily for days 1-21. 7 days rest.   prochlorperazine (COMPAZINE) 5 MG tablet Take 5 mg by mouth every 8 (eight) hours as needed.   promethazine (PHENERGAN) 25 MG tablet Take 1 tablet (25 mg total) by mouth every 6 (six) hours as needed for nausea or vomiting.   senna (SENOKOT) 8.6 MG tablet Take 1 tablet by mouth daily as needed for constipation.   valACYclovir (VALTREX) 500 MG tablet Take 500 mg by mouth daily.   No facility-administered encounter medications on file as of 04/10/2021.    Have you seen any other providers since your last visit? Patient stated she went to her chemo doctor.   Any changes in your medications or health? Patient stated no.  Any side effects from any medications? Patient stated no.   Do you have an symptoms or problems not managed by your medications? Patient stated no.  Any  concerns about your health right now? Patient stated no.  Has your provider asked that you check blood pressure, blood sugar, or follow special diet at home? Patient stated she checks her blood pressure 3-4 times a week.  Do you get any type of exercise on a regular basis? Patient stated she tries to walk 5-10 min daily.   Can you think of a goal you would like to reach for your health? Patient stated she would like to be cancer free.  Do you have any problems getting your medications? Patient stated no.  Is there anything that you would like to discuss during the appointment? Patient stated no..  Please bring  medications and supplements to appointment, patient reminded of OTP appointment on 4/20 at 9 am.  Follow-Up:Pharmacist Review  Charlann Lange, RMA Clinical Pharmacist Assistant [phone]   5 minutes spent in review, coordination, and documentation.  Reviewed by: Beverly Milch, PharmD Clinical Pharmacist [facility] [phone]

## 2021-04-10 NOTE — Progress Notes (Signed)
Chronic Care Management Pharmacy Note  04/16/2021 Name:  Sherry Oneal MRN:  [id number] DOB:  21-Sep-1946  Subjective: Sherry Oneal is an 75 y.o. year old female who is a primary patient of Humphrey Rolls Timoteo Gaul, MD.  The CCM team was consulted for assistance with disease management and care coordination needs.    Engaged with patient by telephone for initial visit in response to provider referral for pharmacy case management and/or care coordination services.   Consent to Services:  The patient was given the following information about Chronic Care Management services today, agreed to services, and gave verbal consent: 1. CCM service includes personalized support from designated clinical staff supervised by the primary care provider, including individualized plan of care and coordination with other care providers 2. 24/7 contact phone numbers for assistance for urgent and routine care needs. 3. Service will only be billed when office clinical staff spend 20 minutes or more in a month to coordinate care. 4. Only one practitioner may furnish and bill the service in a calendar month. 5.The patient may stop CCM services at any time (effective at the end of the month) by phone call to the office staff. 6. The patient will be responsible for cost sharing (co-pay) of up to 20% of the service fee (after annual deductible is met). Patient agreed to services and consent obtained.  Patient Care Team: Lavera Guise, MD as PCP - General (Internal Medicine) Edythe Clarity, [facility] as Pharmacist (Pharmacist)  Recent office visits: 03/21/21 [facility]) - cough and sore throat treated with Zpak  Recent consult visits: Oncology visits - no information  Hospital visits: 03/23/21 (ED) - cough and SOB - workup was all negative, sent home on Tessalon Perles and codeine cough syrup.    Objective:  Lab Results  Component Value Date   CREATININE 0.87 03/23/2021   BUN 12 03/23/2021   GFRNONAA >60 03/23/2021   GFRAA 58  (L) 04/20/2020   NA 140 03/23/2021   K 3.8 03/23/2021   CALCIUM 8.8 (L) 03/23/2021   CO2 24 03/23/2021   GLUCOSE 97 03/23/2021    No results found for: HGBA1C, FRUCTOSAMINE, GFR, MICROALBUR  Last diabetic Eye exam: No results found for: HMDIABEYEEXA  Last diabetic Foot exam: No results found for: HMDIABFOOTEX   No results found for: CHOL, HDL, LDLCALC, LDLDIRECT, TRIG, CHOLHDL  Hepatic Function Latest Ref Rng & Units 04/20/2020 08/04/2019 08/03/2019  Total Protein 6.5 - 8.1 g/dL 6.6 4.3(L) 5.3(L)  Albumin 3.5 - 5.0 g/dL 4.2 2.4(L) 3.4(L)  AST 15 - 41 U/L _0 ALT 0 - 44 U/L _1 Alk Phosphatase 38 - 126 U/L 57 39 61  Total Bilirubin 0.3 - 1.2 mg/dL 0.9 1.0 0.7    No results found for: TSH, FREET4  CBC Latest Ref Rng & Units 03/23/2021 04/20/2020 08/04/2019  WBC 4.0 - 10.5 K/uL 7.2 15.4(H) 5.9  Hemoglobin 12.0 - 15.0 g/dL 12.3 13.9 10.3(L)  Hematocrit 36.0 - 46.0 % 37.8 42.0 31.0(L)  Platelets 150 - 400 K/uL 170 226 113(L)    No results found for: VD25OH  Clinical ASCVD: No  The 10-year ASCVD risk score Mikey Bussing DC Jr., et al., 2013) is: 21.4%   Values used to calculate the score:     Age: 65 years     Sex: Female     Is Non-Hispanic African American: No     Diabetic: No     Tobacco smoker: No     Systolic  Blood Pressure: 137 mmHg     Is BP treated: Yes     HDL Cholesterol: 42 mg/dL     Total Cholesterol: 184 mg/dL    Depression screen [facility] 2/9 03/21/2021 11/28/2020 04/16/2020  Decreased Interest 0 0 0  Down, Depressed, Hopeless 0 0 0  PHQ - 2 Score 0 0 0     Social History   Tobacco Use  Smoking Status Never Smoker  Smokeless Tobacco Never Used   BP Readings from Last 3 Encounters:  03/23/21 (!) 150/75  11/28/20 (!) 144/69  07/15/20 136/74   Pulse Readings from Last 3 Encounters:  03/23/21 71  11/28/20 81  07/15/20 71   Wt Readings from Last 3 Encounters:  03/23/21 137 lb (62.1 kg)  03/21/21 136 lb (61.7 kg)  11/28/20 136 lb (61.7 kg)   BMI Readings  from Last 3 Encounters:  03/23/21 23.15 kg/m  03/21/21 23.34 kg/m  11/28/20 23.34 kg/m    Assessment/Interventions: Review of patient past medical history, allergies, medications, health status, including review of consultants reports, laboratory and other test data, was performed as part of comprehensive evaluation and provision of chronic care management services.   SDOH:  (Social Determinants of Health) assessments and interventions performed: Yes  SDOH Screenings   Alcohol Screen: Not on file  Depression (PHQ2-9): Low Risk   . PHQ-2 Score: 0  Financial Resource Strain: Low Risk   . Difficulty of Paying Living Expenses: Not very hard  Food Insecurity: Not on file  Housing: Not on file  Physical Activity: Not on file  Social Connections: Not on file  Stress: Not on file  Tobacco Use: Low Risk   . Smoking Tobacco Use: Never Smoker  . Smokeless Tobacco Use: Never Used  Transportation Needs: Not on file    CCM Care Plan  Allergies  Allergen Reactions  . Aspirin Other (See Comments)    Causes internal bleeding per patient  . Atenolol     "wierd feeling"  . Doxycycline     Upset stomach   . Epinephrine     "Shaking"  . Ivp Dye [Iodinated Diagnostic Agents] Hypertension    Per tech conversation with PT, pt states that the last time she had IV dye she had increased BP and tachycardia.     Marland Kitchen Ketorolac   . Nsaids     Bleeding  . Penicillins     rash  . Sulfa Antibiotics     Rash   . Tolmetin     Bleeding  . Tramadol     Numb feeling   . Procaine Palpitations    States this was added due to palpitations and shakes after receiving local anesthetic containing epinephrine.     Medications Reviewed Today    Reviewed by Edythe Clarity, [facility] (Pharmacist) on 04/16/21 at [phone]  Med List Status: <None>  Medication Order Taking? Sig Documenting Provider Last Dose Status Informant  acetaminophen (TYLENOL) 500 MG tablet [id number] Yes Take 650 mg by mouth in the  morning and at bedtime. [provider] Taking Active Spouse/Significant Other  ALPRAZolam (XANAX) 0.25 MG tablet [id number] Yes Take 1 tablet (0.25 mg total) by mouth at bedtime as needed. Kendell Bane, NP Taking Active   atorvastatin (LIPITOR) 10 MG tablet [id number] Yes Take 1 tablet by mouth once daily Lavera Guise, MD Taking Active   Calcium Carbonate-Vitamin D 600-400 MG-UNIT tablet [id number] Yes Take 1 tablet by mouth daily. [provider] Taking Active Spouse/Significant Other  Cranberry 500  MG CAPS [id number] Yes Take 500 mg by mouth every morning. [provider] Taking Active Spouse/Significant Other  DARATUMUMAB IV [id number] Yes Inject into the vein every 30 (thirty) days. [provider] Taking Active Spouse/Significant Other  dicyclomine (BENTYL) 10 MG capsule [id number] Yes TAKE 1 CAPSULE BY MOUTH THREE TIMES DAILY AS NEEDED FOR  SPASMS.  TAKE  WITH  MEALS Lavera Guise, MD Taking Active   diphenhydrAMINE (BENADRYL) 25 MG tablet [id number] Yes Take 25 mg by mouth daily as needed. [provider] Taking Active Spouse/Significant Other  docusate (COLACE) 50 MG/5ML liquid [id number] No Take by mouth daily as needed for mild constipation.  Patient not taking: Reported on 04/16/2021   [provider] Not Taking Active Spouse/Significant Other  ELIQUIS 5 MG TABS tablet [id number] Yes Take 1 tablet by mouth 2 (two) times daily. [provider] Taking Active Spouse/Significant Other  fluticasone (FLONASE) 50 MCG/ACT nasal spray [id number] Yes Place into both nostrils daily. [provider] Taking Active Spouse/Significant Other  ipratropium-albuterol (DUONEB) 0.5-2.5 (3) MG/3ML SOLN [id number] Yes Take 3 mLs by nebulization every 6 (six) hours as needed. Kendell Bane, NP Taking Active Spouse/Significant Other  lisinopril (ZESTRIL) 5 MG tablet [id number] Yes Take 1 tablet by mouth once daily Lavera Guise, MD Taking Active    meclizine (ANTIVERT) 25 MG tablet [id number] Yes Take 25 mg by mouth as needed for dizziness. [provider] Taking Active   Multiple Vitamins-Minerals (PRESERVISION AREDS PO) [id number] Yes Take 1 tablet by mouth every morning. [provider] Taking Active Spouse/Significant Other  omeprazole (PRILOSEC) 20 MG capsule [id number] Yes Take 20 mg by mouth daily. [provider] Taking Active Spouse/Significant Other  ondansetron (ZOFRAN-ODT) 4 MG disintegrating tablet [id number] Yes DISSOLVE 1 TABLET IN MOUTH EVERY 8 HOURS AS NEEDED FOR NAUSEA [provider] Taking Active Spouse/Significant Other  polyethylene glycol (MIRALAX / GLYCOLAX) packet [id number] Yes Take 17 g by mouth daily as needed. [provider] Taking Active Spouse/Significant Other  pomalidomide (POMALYST) 4 MG capsule [id number] Yes Take 1 capsule by mouth once daily for days 1-21. 7 days rest. [provider] Taking Active Spouse/Significant Other  prochlorperazine (COMPAZINE) 5 MG tablet [id number] Yes Take 5 mg by mouth every 8 (eight) hours as needed. [provider] Taking Active Spouse/Significant Other  promethazine (PHENERGAN) 25 MG tablet [id number] Yes Take 1 tablet (25 mg total) by mouth every 6 (six) hours as needed for nausea or vomiting. Harvest Dark, MD Taking Active   senna (SENOKOT) 8.6 MG tablet [id number] No Take 1 tablet by mouth daily as needed for constipation.  Patient not taking: Reported on 04/16/2021   [provider] Not Taking Active Spouse/Significant Other  valACYclovir (VALTREX) 500 MG tablet [id number] Yes Take 500 mg by mouth daily. [provider] Taking Active Spouse/Significant Other  vitamin B-12 (CYANOCOBALAMIN) 500 MCG tablet [id number] Yes Take 500 mcg by mouth daily. [provider] Taking Active           Patient Active Problem List   Diagnosis Date Noted  . Macular degeneration of left eye 11/28/2020   . Encounter for general adult medical examination with abnormal findings 11/20/2019  . Acute colitis 08/03/2019  . Screening for breast cancer 11/17/2018  . Dysuria 11/17/2018  . Acute pharyngitis 08/26/2018  . Sore throat 08/26/2018  . Generalized anxiety disorder 06/13/2018  . HCAP (healthcare-associated pneumonia) 03/22/2018  . Acute bronchitis 03/01/2018  . Cough 03/01/2018  . Flu-like symptoms 03/01/2018  . Essential  hypertension 03/01/2018  . Hypogammaglobulinemia, acquired ([place]) 01/11/2018  . Exertional dyspnea 09/03/2017  . Pancytopenia due to chemotherapy ([place]) 05/18/2017  . LGI bleed 01/30/2017  . Acute blood loss anemia 01/30/2017  . Abdominal pain 01/30/2017  . Multiple myeloma ([place]) 01/30/2017  . Mixed hyperlipidemia 10/29/2016  . Nephrolithiasis 04/04/2013  . Uncertain tumor of kidney and ureter 04/04/2013    Immunization History  Administered Date(s) Administered  . Influenza Inj Mdck Quad Pf 10/14/2018  . Influenza, High Dose Seasonal PF 10/14/2018, 09/26/2019  . Influenza-Unspecified 08/28/2014, 10/16/2017, 10/14/2018, 09/16/2020  . PFIZER(Purple Top)SARS-COV-2 Vaccination 02/08/2020, 03/01/2020, 08/04/2020  . Pneumococcal Conjugate-13 10/01/2016  . Pneumococcal Polysaccharide-23 11/23/2017    Conditions to be addressed/monitored:  HTN, HLD, GAD, Multiple myeloma  Care Plan : General Pharmacy (Adult)  Updates made by Edythe Clarity, RPH since 04/16/2021 12:00 AM    Problem: HTN, HLD, GAD, Multiple myeloma   Priority: High  Onset Date: 04/16/2021    Long-Range Goal: Patient Stated   Start Date: 04/16/2021  Expected End Date: 10/16/2021  This Visit's Progress: On track  Priority: High  Note:   Current Barriers:  . No current barriers on medications noted.  Pharmacist Clinical Goal(s):  Marland Kitchen Patient will achieve adherence to monitoring guidelines and medication adherence to achieve therapeutic efficacy . maintain control of blood pressure as  evidenced by home monitoring  . contact provider office for questions/concerns as evidenced notation of same in electronic health record through collaboration with PharmD and provider.   Interventions: . 1:1 collaboration with Lavera Guise, MD regarding development and update of comprehensive plan of care as evidenced by provider attestation and co-signature . Inter-disciplinary care team collaboration (see longitudinal plan of care) . Comprehensive medication review performed; medication list updated in electronic medical record  Hypertension (BP goal <140/90) -Controlled -Current treatment: . Lisinopril 31m -Medications previously tried: none noted -Current home readings: 110/65 is about her normal at home  -Current exercise habits: tries to walk about 5 to 10 minutes per day, gets really tired but does make effort to do this whenever possible -Denies hypotensive/hypertensive symptoms -Educated on BP goals and benefits of medications for prevention of heart attack, stroke and kidney damage; Importance of home blood pressure monitoring; Symptoms of hypotension and importance of maintaining adequate hydration; -Counseled to monitor BP at home a few times per week, document, and provide log at future appointments -Recommended to continue current medication Recommended contacting providers if BP were consistently > 140/90.  Hyperlipidemia: (LDL goal < 100) -Not ideally controlled -Current treatment: . Atorvastatin 174m-Medications previously tried: none noted  -Current exercise habits: see above -Educated on Cholesterol goals;  Benefits of statin for ASCVD risk reduction; Importance of limiting foods high in cholesterol;  -Reviewed most recent lipid panel, slightly elevated -Would hesitate to increase statin dose due to everything else she has going on,  Will continue to follow and track trends -Counseled on diet and exercise extensively Recommended to continue current  medication  Depression/Anxiety (Goal: Minimize symptoms) -Controlled -Current treatment: . Alprazolam 0.2530mrn -Medications previously tried/failed: none noted -Educated on Benefits of medication for symptom control  -Reports sleep is good, only takes Xanax as needed -Recommended to continue current medication  Multiple myeloma (Goal: Minimize symptoms) -Controlled -Current treatment  . Pomalidomide 4mg35mpsule . Daratumumab every 30 days . Eliquis 5mg 64m . Zofran ODT 4mg p33m. Compazine 5mg pr2m Promethazine 25mg pr3medications previously tried: Velcade (d/c) -Patient takes Eliquis due to stroke risk from other  chemo drugs. -She denies any abnormal bruising or bleeding.  -Reports nausea at bedtime and is now taking a Zofram about 30 minutes before she takes her Pomalyst which has been helping.  Will save Phenergan to last resort as it makes her really drowsy. -Recommended to continue current medication  -Reports Eliquis copay is manageable now as she hit catastrophic phase pretty early in January.  Discussed availability of PAP program and instructed her to reach out if this is ever necessary to ensure Eliquis therapy continues.   Patient Goals/Self-Care Activities . Patient will:  - take medications as prescribed focus on medication adherence by pill count check blood pressure daily, document, and provide at future appointments  Follow Up Plan: The care management team will reach out to the patient again over the next 180 days.         Medication Assistance: None required.  Patient affirms current coverage meets needs.  Patient's preferred pharmacy is:  [facility] [address] [place] - [place] [place] [address] Phone: [phone] Fax: [phone]  Uses pill box? No - has her own organization methods Pt endorses 100% compliance  We discussed: Benefits of medication synchronization, packaging and delivery as well as  enhanced pharmacist oversight with [employer]. Patient decided to: Continue current medication management strategy  Care Plan and Follow Up Patient Decision:  Patient agrees to Care Plan and Follow-up.  Plan: The care management team will reach out to the patient again over the next 180 days.  Beverly Milch, PharmD Clinical Pharmacist [facility] [phone]

## 2021-04-16 ENCOUNTER — Ambulatory Visit: Payer: PPO | Admitting: Pharmacist

## 2021-04-16 DIAGNOSIS — C9000 Multiple myeloma not having achieved remission: Secondary | ICD-10-CM

## 2021-04-16 DIAGNOSIS — I10 Essential (primary) hypertension: Secondary | ICD-10-CM

## 2021-04-16 DIAGNOSIS — E782 Mixed hyperlipidemia: Secondary | ICD-10-CM

## 2021-04-16 NOTE — Patient Instructions (Addendum)
Visit Information  Goals Addressed            This Visit's Progress   . Track and Manage My Blood Pressure-Hypertension       Timeframe:  Long-Range Goal Priority:  High Start Date:  04/16/21                           Expected End Date:  10/16/21                     Follow Up Date 07/26/21   - check blood pressure 3 times per week - choose a place to take my blood pressure (home, clinic or office, retail store) - write blood pressure results in a log or diary    Why is this important?    You won't feel high blood pressure, but it can still hurt your blood vessels.   High blood pressure can cause heart or kidney problems. It can also cause a stroke.   Making lifestyle changes like losing a little weight or eating less salt will help.   Checking your blood pressure at home and at different times of the day can help to control blood pressure.   If the doctor prescribes medicine remember to take it the way the doctor ordered.   Call the office if you cannot afford the medicine or if there are questions about it.     Notes:       Patient Care Plan: General Pharmacy (Adult)    Problem Identified: HTN, HLD, GAD, Multiple myeloma   Priority: High  Onset Date: 04/16/2021    Long-Range Goal: Patient Stated   Start Date: 04/16/2021  Expected End Date: 10/16/2021  This Visit's Progress: On track  Priority: High  Note:   Current Barriers:  . No current barriers on medications noted.  Pharmacist Clinical Goal(s):  Marland Kitchen Patient will achieve adherence to monitoring guidelines and medication adherence to achieve therapeutic efficacy . maintain control of blood pressure as evidenced by home monitoring  . contact provider office for questions/concerns as evidenced notation of same in electronic health record through collaboration with PharmD and provider.   Interventions: . 1:1 collaboration with Lavera Guise, MD regarding development and update of comprehensive plan of care as  evidenced by provider attestation and co-signature . Inter-disciplinary care team collaboration (see longitudinal plan of care) . Comprehensive medication review performed; medication list updated in electronic medical record  Hypertension (BP goal <140/90) -Controlled -Current treatment: . Lisinopril 36m -Medications previously tried: none noted -Current home readings: 110/65 is about her normal at home  -Current exercise habits: tries to walk about 5 to 10 minutes per day, gets really tired but does make effort to do this whenever possible -Denies hypotensive/hypertensive symptoms -Educated on BP goals and benefits of medications for prevention of heart attack, stroke and kidney damage; Importance of home blood pressure monitoring; Symptoms of hypotension and importance of maintaining adequate hydration; -Counseled to monitor BP at home a few times per week, document, and provide log at future appointments -Recommended to continue current medication Recommended contacting providers if BP were consistently > 140/90.  Hyperlipidemia: (LDL goal < 100) -Not ideally controlled -Current treatment: . Atorvastatin 118m-Medications previously tried: none noted  -Current exercise habits: see above -Educated on Cholesterol goals;  Benefits of statin for ASCVD risk reduction; Importance of limiting foods high in cholesterol;  -Reviewed most recent lipid panel, slightly elevated -Would hesitate to increase  statin dose due to everything else she has going on,  Will continue to follow and track trends -Counseled on diet and exercise extensively Recommended to continue current medication  Depression/Anxiety (Goal: Minimize symptoms) -Controlled -Current treatment: . Alprazolam 0.82m prn -Medications previously tried/failed: none noted -Educated on Benefits of medication for symptom control  -Reports sleep is good, only takes Xanax as needed -Recommended to continue current  medication  Multiple myeloma (Goal: Minimize symptoms) -Controlled -Current treatment  . Pomalidomide 482mcapsule . Daratumumab every 30 days . Eliquis 46m73mID . Zofran ODT 4mg79mn . Compazine 46mg 42m . Promethazine 246mg 67m-Medications previously tried: Velcade (d/c) -Patient takes Eliquis due to stroke risk from other chemo drugs. -She denies any abnormal bruising or bleeding.  -Reports nausea at bedtime and is now taking a Zofram about 30 minutes before she takes her Pomalyst which has been helping.  Will save Phenergan to last resort as it makes her really drowsy. -Recommended to continue current medication  -Reports Eliquis copay is manageable now as she hit catastrophic phase pretty early in January.  Discussed availability of PAP program and instructed her to reach out if this is ever necessary to ensure Eliquis therapy continues.   Patient Goals/Self-Care Activities . Patient will:  - take medications as prescribed focus on medication adherence by pill count check blood pressure daily, document, and provide at future appointments  Follow Up Plan: The care management team will reach out to the patient again over the next 180 days.        Ms. Sherry Leonhartiven information about Chronic Care Management services today including:  1. CCM service includes personalized support from designated clinical staff supervised by her physician, including individualized plan of care and coordination with other care providers 2. 24/7 contact phone numbers for assistance for urgent and routine care needs. 3. Standard insurance, coinsurance, copays and deductibles apply for chronic care management only during months in which we provide at least 20 minutes of these services. Most insurances cover these services at 100%, however patients may be responsible for any copay, coinsurance and/or deductible if applicable. This service may help you avoid the need for more expensive face-to-face  services. 4. Only one practitioner may furnish and bill the service in a calendar month. 5. The patient may stop CCM services at any time (effective at the end of the month) by phone call to the office staff.  Patient agreed to services and verbal consent obtained.   The patient verbalized understanding of instructions, educational materials, and care plan provided today and agreed to receive a mailed copy of patient instructions, educational materials, and care plan.  Telephone follow up appointment with pharmacy team member scheduled for: 6 months  [facility] Cholesterol  High cholesterol is a condition in which the blood has high levels of a white, waxy substance similar to fat (cholesterol). The liver makes all the cholesterol that the body needs. The human body needs small amounts of cholesterol to help build cells. A person gets extra or excess cholesterol from the food that he or she eats. The blood carries cholesterol from the liver to the rest of the body. If you have high cholesterol, deposits (plaques) may build up on the walls of your arteries. Arteries are the blood vessels that carry blood away from your heart. These plaques make the arteries narrow and stiff. Cholesterol plaques increase your risk for heart attack and stroke. Work with your health care provider to  keep your cholesterol levels in a healthy range. What increases the risk? The following factors may make you more likely to develop this condition:  Eating foods that are high in animal fat (saturated fat) or cholesterol.  Being overweight.  Not getting enough exercise.  A family history of high cholesterol (familial hypercholesterolemia).  Use of tobacco products.  Having diabetes. What are the signs or symptoms? There are no symptoms of this condition. How is this diagnosed? This condition may be diagnosed based on the results of a blood test.  If you are older than 75 years of age, your  health care provider may check your cholesterol levels every 4-6 years.  You may be checked more often if you have high cholesterol or other risk factors for heart disease. The blood test for cholesterol measures:  "Bad" cholesterol, or LDL cholesterol. This is the main type of cholesterol that causes heart disease. The desired level is less than 100 mg/dL.  "Good" cholesterol, or HDL cholesterol. HDL helps protect against heart disease by cleaning the arteries and carrying the LDL to the liver for processing. The desired level for HDL is 60 mg/dL or higher.  Triglycerides. These are fats that your body can store or burn for energy. The desired level is less than 150 mg/dL.  Total cholesterol. This measures the total amount of cholesterol in your blood and includes LDL, HDL, and triglycerides. The desired level is less than 200 mg/dL. How is this treated? This condition may be treated with:  Diet changes. You may be asked to eat foods that have more fiber and less saturated fats or added sugar.  Lifestyle changes. These may include regular exercise, maintaining a healthy weight, and quitting use of tobacco products.  Medicines. These are given when diet and lifestyle changes have not worked. You may be prescribed a statin medicine to help lower your cholesterol levels. Follow these instructions at home: Eating and drinking  Eat a healthy, balanced diet. This diet includes: ? Daily servings of a variety of fresh, frozen, or canned fruits and vegetables. ? Daily servings of whole grain foods that are rich in fiber. ? Foods that are low in saturated fats and trans fats. These include poultry and fish without skin, lean cuts of meat, and low-fat dairy products. ? A variety of fish, especially oily fish that contain omega-3 fatty acids. Aim to eat fish at least 2 times a week.  Avoid foods and drinks that have added sugar.  Use healthy cooking methods, such as roasting, grilling, broiling,  baking, poaching, steaming, and stir-frying. Do not fry your food except for stir-frying.   Lifestyle  Get regular exercise. Aim to exercise for a total of 150 minutes a week. Increase your activity level by doing activities such as gardening, walking, and taking the stairs.  Do not use any products that contain nicotine or tobacco, such as cigarettes, e-cigarettes, and chewing tobacco. If you need help quitting, ask your health care provider.   General instructions  Take over-the-counter and prescription medicines only as told by your health care provider.  Keep all follow-up visits as told by your health care provider. This is important. Where to find more information  American Heart Association: www.heart.org  National Heart, Lung, and Blood Institute: https://wilson-eaton.com/ Contact a health care provider if:  You have trouble achieving or maintaining a healthy diet or weight.  You are starting an exercise program.  You are unable to stop smoking. Get help right away if:  You  have chest pain.  You have trouble breathing.  You have any symptoms of a stroke. "BE FAST" is an easy way to remember the main warning signs of a stroke: ? B - Balance. Signs are dizziness, sudden trouble walking, or loss of balance. ? E - Eyes. Signs are trouble seeing or a sudden change in vision. ? F - Face. Signs are sudden weakness or numbness of the face, or the face or eyelid drooping on one side. ? A - Arms. Signs are weakness or numbness in an arm. This happens suddenly and usually on one side of the body. ? S - Speech. Signs are sudden trouble speaking, slurred speech, or trouble understanding what people say. ? T - Time. Time to call emergency services. Write down what time symptoms started.  You have other signs of a stroke, such as: ? A sudden, severe headache with no known cause. ? Nausea or vomiting. ? Seizure. These symptoms may represent a serious problem that is an emergency. Do not wait  to see if the symptoms will go away. Get medical help right away. Call your local emergency services (911 in the [place] Do not drive yourself to the hospital. Summary  Cholesterol plaques increase your risk for heart attack and stroke. Work with your health care provider to keep your cholesterol levels in a healthy range.  Eat a healthy, balanced diet, get regular exercise, and maintain a healthy weight.  Do not use any products that contain nicotine or tobacco, such as cigarettes, e-cigarettes, and chewing tobacco.  Get help right away if you have any symptoms of a stroke. This information is not intended to replace advice given to you by your health care provider. Make sure you discuss any questions you have with your health care provider. Document Revised: 11/13/2019 Document Reviewed: 11/13/2019 Elsevier Patient Education  2021 [employer].

## 2021-04-21 DIAGNOSIS — H353221 Exudative age-related macular degeneration, left eye, with active choroidal neovascularization: Secondary | ICD-10-CM | POA: Diagnosis not present

## 2021-04-26 DIAGNOSIS — E782 Mixed hyperlipidemia: Secondary | ICD-10-CM

## 2021-04-26 DIAGNOSIS — J209 Acute bronchitis, unspecified: Secondary | ICD-10-CM

## 2021-04-26 DIAGNOSIS — I10 Essential (primary) hypertension: Secondary | ICD-10-CM

## 2021-05-06 DIAGNOSIS — R5383 Other fatigue: Secondary | ICD-10-CM | POA: Diagnosis not present

## 2021-05-06 DIAGNOSIS — H3530 Unspecified macular degeneration: Secondary | ICD-10-CM | POA: Diagnosis not present

## 2021-05-06 DIAGNOSIS — R110 Nausea: Secondary | ICD-10-CM | POA: Diagnosis not present

## 2021-05-06 DIAGNOSIS — Z5111 Encounter for antineoplastic chemotherapy: Secondary | ICD-10-CM | POA: Diagnosis not present

## 2021-05-06 DIAGNOSIS — E041 Nontoxic single thyroid nodule: Secondary | ICD-10-CM | POA: Diagnosis not present

## 2021-05-06 DIAGNOSIS — R0600 Dyspnea, unspecified: Secondary | ICD-10-CM | POA: Diagnosis not present

## 2021-05-06 DIAGNOSIS — L989 Disorder of the skin and subcutaneous tissue, unspecified: Secondary | ICD-10-CM | POA: Diagnosis not present

## 2021-05-06 DIAGNOSIS — C9000 Multiple myeloma not having achieved remission: Secondary | ICD-10-CM | POA: Diagnosis not present

## 2021-05-06 DIAGNOSIS — F329 Major depressive disorder, single episode, unspecified: Secondary | ICD-10-CM | POA: Diagnosis not present

## 2021-05-06 DIAGNOSIS — F419 Anxiety disorder, unspecified: Secondary | ICD-10-CM | POA: Diagnosis not present

## 2021-05-06 DIAGNOSIS — C9001 Multiple myeloma in remission: Secondary | ICD-10-CM | POA: Diagnosis not present

## 2021-05-06 DIAGNOSIS — R109 Unspecified abdominal pain: Secondary | ICD-10-CM | POA: Diagnosis not present

## 2021-05-06 DIAGNOSIS — E538 Deficiency of other specified B group vitamins: Secondary | ICD-10-CM | POA: Diagnosis not present

## 2021-05-06 DIAGNOSIS — Z79899 Other long term (current) drug therapy: Secondary | ICD-10-CM | POA: Diagnosis not present

## 2021-05-10 ENCOUNTER — Other Ambulatory Visit: Payer: Self-pay | Admitting: Internal Medicine

## 2021-05-10 DIAGNOSIS — I10 Essential (primary) hypertension: Secondary | ICD-10-CM

## 2021-05-10 DIAGNOSIS — K529 Noninfective gastroenteritis and colitis, unspecified: Secondary | ICD-10-CM

## 2021-05-29 ENCOUNTER — Ambulatory Visit: Payer: PPO | Admitting: Nurse Practitioner

## 2021-06-03 DIAGNOSIS — R0600 Dyspnea, unspecified: Secondary | ICD-10-CM | POA: Diagnosis not present

## 2021-06-03 DIAGNOSIS — C9000 Multiple myeloma not having achieved remission: Secondary | ICD-10-CM | POA: Diagnosis not present

## 2021-06-03 DIAGNOSIS — E538 Deficiency of other specified B group vitamins: Secondary | ICD-10-CM | POA: Diagnosis not present

## 2021-06-03 DIAGNOSIS — Z23 Encounter for immunization: Secondary | ICD-10-CM | POA: Diagnosis not present

## 2021-06-03 DIAGNOSIS — Z20822 Contact with and (suspected) exposure to covid-19: Secondary | ICD-10-CM | POA: Diagnosis not present

## 2021-06-03 DIAGNOSIS — C9001 Multiple myeloma in remission: Secondary | ICD-10-CM | POA: Diagnosis not present

## 2021-06-03 DIAGNOSIS — Z5112 Encounter for antineoplastic immunotherapy: Secondary | ICD-10-CM | POA: Diagnosis not present

## 2021-06-09 DIAGNOSIS — H353221 Exudative age-related macular degeneration, left eye, with active choroidal neovascularization: Secondary | ICD-10-CM | POA: Diagnosis not present

## 2021-06-10 ENCOUNTER — Telehealth: Payer: Self-pay | Admitting: Pharmacist

## 2021-06-10 NOTE — Progress Notes (Addendum)
Chronic Care Management Pharmacy Assistant   Name: Sherry Oneal  MRN: [id number] DOB: 1946/03/29  Reason for Encounter: General Disease State Call   Conditions to be addressed/monitored: HTN, HLD, GAD, Multiple myeloma.  Recent office visits:  None since 04/16/21  Recent consult visits:  05/06/21 Hematology and Oncology Jacqualyn Posey. For follow-up. No information given.  Hospital visits:  None since 04/16/21  Medications: Outpatient Encounter Medications as of 06/10/2021  Medication Sig   acetaminophen (TYLENOL) 500 MG tablet Take 650 mg by mouth in the morning and at bedtime.   ALPRAZolam (XANAX) 0.25 MG tablet Take 1 tablet (0.25 mg total) by mouth at bedtime as needed.   atorvastatin (LIPITOR) 10 MG tablet Take 1 tablet by mouth once daily   Calcium Carbonate-Vitamin D 600-400 MG-UNIT tablet Take 1 tablet by mouth daily.   Cranberry 500 MG CAPS Take 500 mg by mouth every morning.   DARATUMUMAB IV Inject into the vein every 30 (thirty) days.   dicyclomine (BENTYL) 10 MG capsule TAKE 1 CAPSULE BY MOUTH THREE TIMES DAILY AS NEEDED FOR  SPASMS.  TAKE  WITH  MEALS   diphenhydrAMINE (BENADRYL) 25 MG tablet Take 25 mg by mouth daily as needed.   docusate (COLACE) 50 MG/5ML liquid Take by mouth daily as needed for mild constipation. (Patient not taking: Reported on 04/16/2021)   ELIQUIS 5 MG TABS tablet Take 1 tablet by mouth 2 (two) times daily.   fluticasone (FLONASE) 50 MCG/ACT nasal spray Place into both nostrils daily.   ipratropium-albuterol (DUONEB) 0.5-2.5 (3) MG/3ML SOLN Take 3 mLs by nebulization every 6 (six) hours as needed.   lisinopril (ZESTRIL) 5 MG tablet Take 1 tablet by mouth once daily   meclizine (ANTIVERT) 25 MG tablet Take 25 mg by mouth as needed for dizziness.   Multiple Vitamins-Minerals (PRESERVISION AREDS PO) Take 1 tablet by mouth every morning.   omeprazole (PRILOSEC) 20 MG capsule Take 20 mg by mouth daily.   ondansetron (ZOFRAN-ODT) 4 MG  disintegrating tablet DISSOLVE 1 TABLET IN MOUTH EVERY 8 HOURS AS NEEDED FOR NAUSEA   polyethylene glycol (MIRALAX / GLYCOLAX) packet Take 17 g by mouth daily as needed.   pomalidomide (POMALYST) 4 MG capsule Take 1 capsule by mouth once daily for days 1-21. 7 days rest.   prochlorperazine (COMPAZINE) 5 MG tablet Take 5 mg by mouth every 8 (eight) hours as needed.   promethazine (PHENERGAN) 25 MG tablet Take 1 tablet (25 mg total) by mouth every 6 (six) hours as needed for nausea or vomiting.   senna (SENOKOT) 8.6 MG tablet Take 1 tablet by mouth daily as needed for constipation. (Patient not taking: Reported on 04/16/2021)   valACYclovir (VALTREX) 500 MG tablet Take 500 mg by mouth daily.   vitamin B-12 (CYANOCOBALAMIN) 500 MCG tablet Take 500 mcg by mouth daily.   No facility-administered encounter medications on file as of 06/10/2021.   GEN CALL: Patient stated she is doing okay and its been going fairly well. She stated she does take some cancer pills outside her treatment that she takes, she stated er medications are not upsetting her stomach or anything but if she does have some  nausea medications if she start having any side effects from any of her cancer medication. Patient stated she is still has a really good appetite. She stated she's in remission but the treatment is still mandatory.   Star Rating Drugs: Lisinopril 5 mg 90 DS 05/12/21 Atorvastatin 10 mg 90 DS 02/19/21  Follow-up:Pharmacist Review  Charlann Lange, RMA Clinical Pharmacist Assistant [phone]  10 minutes spent in review, coordination, and documentation.  Reviewed by: Beverly Milch, PharmD Clinical Pharmacist [facility] [phone]

## 2021-06-11 ENCOUNTER — Ambulatory Visit: Payer: PPO | Admitting: Nurse Practitioner

## 2021-06-18 ENCOUNTER — Encounter: Payer: Self-pay | Admitting: Nurse Practitioner

## 2021-06-18 ENCOUNTER — Other Ambulatory Visit: Payer: Self-pay

## 2021-06-18 ENCOUNTER — Ambulatory Visit (INDEPENDENT_AMBULATORY_CARE_PROVIDER_SITE_OTHER): Payer: PPO | Admitting: Nurse Practitioner

## 2021-06-18 VITALS — BP 128/63 | HR 57 | Temp 98.3°F | Resp 16 | Ht 64.0 in | Wt 138.0 lb

## 2021-06-18 DIAGNOSIS — F411 Generalized anxiety disorder: Secondary | ICD-10-CM | POA: Diagnosis not present

## 2021-06-18 DIAGNOSIS — C9001 Multiple myeloma in remission: Secondary | ICD-10-CM | POA: Diagnosis not present

## 2021-06-18 DIAGNOSIS — I10 Essential (primary) hypertension: Secondary | ICD-10-CM

## 2021-06-18 DIAGNOSIS — E782 Mixed hyperlipidemia: Secondary | ICD-10-CM | POA: Diagnosis not present

## 2021-06-18 MED ORDER — ALPRAZOLAM 0.25 MG PO TABS: 0.2500 mg | ORAL_TABLET | Freq: Every evening | ORAL | 2 refills | Status: DC | PRN

## 2021-06-18 NOTE — Progress Notes (Signed)
[facility] [place], [place] [address]  Internal MEDICINE  Office Visit Note  Patient Name: Sherry Oneal  [id number]  [id number]  Date of Service: 06/27/2021  Chief Complaint  Patient presents with   Follow-up    Med refill   Anemia   Hypertension    HPI Sherry Oneal presents for a follow up visit for hypertension. Her blood pressure is adequately controlled at this time. She also has multiple myeloma and is in remission. She is on mild chemotherapy continuously. Her colonoscopy is due in 2024. Mammo done April 2022. She received the second covid booster on 04/18/21 Received Monoclonal antibodies x2   Current Medication: Outpatient Encounter Medications as of 06/18/2021  Medication Sig   acetaminophen (TYLENOL) 500 MG tablet Take 650 mg by mouth in the morning and at bedtime.   ALPRAZolam (XANAX) 0.25 MG tablet Take 1 tablet (0.25 mg total) by mouth at bedtime as needed.   atorvastatin (LIPITOR) 10 MG tablet Take 1 tablet by mouth once daily   Calcium Carbonate-Vitamin D 600-400 MG-UNIT tablet Take 1 tablet by mouth daily.   Cranberry 500 MG CAPS Take 500 mg by mouth every morning.   DARATUMUMAB IV Inject into the vein every 30 (thirty) days.   dicyclomine (BENTYL) 10 MG capsule TAKE 1 CAPSULE BY MOUTH THREE TIMES DAILY AS NEEDED FOR  SPASMS.  TAKE  WITH  MEALS   diphenhydrAMINE (BENADRYL) 25 MG tablet Take 25 mg by mouth daily as needed.   docusate (COLACE) 50 MG/5ML liquid Take by mouth daily as needed for mild constipation. (Patient not taking: Reported on 04/16/2021)   ELIQUIS 5 MG TABS tablet Take 1 tablet by mouth 2 (two) times daily.   fluticasone (FLONASE) 50 MCG/ACT nasal spray Place into both nostrils daily.   ipratropium-albuterol (DUONEB) 0.5-2.5 (3) MG/3ML SOLN Take 3 mLs by nebulization every 6 (six) hours as needed.   lisinopril (ZESTRIL) 5 MG tablet Take 1 tablet by mouth once daily   meclizine (ANTIVERT) 25 MG tablet Take 25 mg by mouth as needed  for dizziness.   Multiple Vitamins-Minerals (PRESERVISION AREDS PO) Take 1 tablet by mouth every morning.   omeprazole (PRILOSEC) 20 MG capsule Take 20 mg by mouth daily.   ondansetron (ZOFRAN-ODT) 4 MG disintegrating tablet DISSOLVE 1 TABLET IN MOUTH EVERY 8 HOURS AS NEEDED FOR NAUSEA   polyethylene glycol (MIRALAX / GLYCOLAX) packet Take 17 g by mouth daily as needed.   pomalidomide (POMALYST) 4 MG capsule Take 1 capsule by mouth once daily for days 1-21. 7 days rest.   prochlorperazine (COMPAZINE) 5 MG tablet Take 5 mg by mouth every 8 (eight) hours as needed.   promethazine (PHENERGAN) 25 MG tablet Take 1 tablet (25 mg total) by mouth every 6 (six) hours as needed for nausea or vomiting.   senna (SENOKOT) 8.6 MG tablet Take 1 tablet by mouth daily as needed for constipation. (Patient not taking: Reported on 04/16/2021)   valACYclovir (VALTREX) 500 MG tablet Take 500 mg by mouth daily.   vitamin B-12 (CYANOCOBALAMIN) 500 MCG tablet Take 500 mcg by mouth daily.   [DISCONTINUED] ALPRAZolam (XANAX) 0.25 MG tablet Take 1 tablet (0.25 mg total) by mouth at bedtime as needed.   No facility-administered encounter medications on file as of 06/18/2021.    Surgical History: Past Surgical History:  Procedure Laterality Date   CATARACT EXTRACTION W/PHACO Right 08/17/2018   Procedure: CATARACT EXTRACTION PHACO AND INTRAOCULAR LENS PLACEMENT ([place])  RIGHT;  Surgeon: Leandrew Koyanagi,  MD;  Location: [place];  Service: Ophthalmology;  Laterality: Right;   CATARACT EXTRACTION W/PHACO Left 11/09/2018   Procedure: CATARACT EXTRACTION PHACO AND INTRAOCULAR LENS PLACEMENT ([place]) LEFT;  Surgeon: Leandrew Koyanagi, MD;  Location: [place];  Service: Ophthalmology;  Laterality: Left;   EXPLORATORY LAPAROTOMY      Medical History: Past Medical History:  Diagnosis Date   Anemia    Arthritis    lower back   High blood pressure    High cholesterol    Macular degeneration     recieving 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    Multiple myeloma (HCC)    Renal insufficiency    Wears dentures    partial upper    Family History: Family History  Problem Relation Age of Onset   Breast cancer Maternal Grandmother 47   CAD Mother    CVA Mother     Social History   Socioeconomic History   Marital status: Married    Spouse name: Not on file   Number of children: 1   Years of education: Not on file   Highest education level: Not on file  Occupational History   Not on file  Tobacco Use   Smoking status: Never   Smokeless tobacco: Never  Vaping Use   Vaping Use: Never used  Substance and Sexual Activity   Alcohol use: No   Drug use: No   Sexual activity: Not Currently    Birth control/protection: None  Other Topics Concern   Not on file  Social History Narrative   Lives at home with her husband, independent at baseline   Social Determinants of Health   Financial Resource Strain: Low Risk    Difficulty of Paying Living Expenses: Not very hard  Food Insecurity: Not on file  Transportation Needs: Not on file  Physical Activity: Not on file  Stress: Not on file  Social Connections: Not on file  Intimate Partner Violence: Not on file      Review of Systems  Constitutional:  Negative for chills, fatigue and unexpected weight change.  HENT:  Negative for congestion, rhinorrhea, sneezing and sore throat.   Eyes:  Negative for redness.  Respiratory:  Negative for cough, chest tightness and shortness of breath.   Cardiovascular:  Negative for chest pain and palpitations.  Gastrointestinal:  Negative for abdominal pain, constipation, diarrhea, nausea and vomiting.  Genitourinary:  Negative for dysuria and frequency.  Musculoskeletal:  Negative for arthralgias, back pain, joint swelling and neck pain.  Skin:  Negative for rash.  Neurological: Negative.  Negative for tremors and numbness.  Hematological:  Negative for  adenopathy. Does not bruise/bleed easily.  Psychiatric/Behavioral:  Negative for behavioral problems (Depression), sleep disturbance and suicidal ideas. The patient is not nervous/anxious.    Vital Signs: BP 128/63   Pulse (!) 57   Temp 98.3 F (36.8 C)   Resp 16   Ht 5' 4" (1.626 m)   Wt 138 lb (62.6 kg)   SpO2 97%   BMI 23.69 kg/m    Physical Exam Vitals reviewed.  Constitutional:      General: She is not in acute distress.    Appearance: Normal appearance. She is normal weight. She is not ill-appearing.  HENT:     Head: Normocephalic and atraumatic.  Cardiovascular:     Rate and Rhythm: Normal rate and regular rhythm.     Pulses: Normal pulses.     Heart sounds: Normal heart sounds.  Pulmonary:     Effort: Pulmonary effort is normal.     Breath sounds: Normal breath sounds.  Skin:    General: Skin is warm and dry.     Capillary Refill: Capillary refill takes less than 2 seconds.  Neurological:     Mental Status: She is alert and oriented to person, place, and time.  Psychiatric:        Mood and Affect: Mood normal.        Behavior: Behavior normal.    Assessment/Plan: 1. Essential hypertension Stable with current medications, no refills needed.   2. Generalized anxiety disorder Stable with current medications, refill ordered.  - ALPRAZolam (XANAX) 0.25 MG tablet; Take 1 tablet (0.25 mg total) by mouth at bedtime as needed.  Dispense: 30 tablet; Refill: 2  3. Multiple myeloma in remission ([place]) On mild chemotherapy continuously  4. Mixed hyperlipidemia Stable, on moderate intensity atorvastatin.    General Counseling: Sherry Oneal understanding of the findings of todays visit and agrees with plan of treatment. I have discussed any further diagnostic evaluation that may be needed or ordered today. We also reviewed her medications today. she has been encouraged to call the office with any questions or concerns that should arise related to todays  visit.    No orders of the defined types were placed in this encounter.   Meds ordered this encounter  Medications   ALPRAZolam (XANAX) 0.25 MG tablet    Sig: Take 1 tablet (0.25 mg total) by mouth at bedtime as needed.    Dispense:  30 tablet    Refill:  2    Return in about 6 months (around 12/18/2021) for CPE, Matson Welch PCP.   Total time spent:30 Minutes Time spent includes review of chart, medications, test results, and follow up plan with the patient.   Sherry Oneal Controlled Substance Database was reviewed by me.  This patient was seen by [name], FNP-C in collaboration with Dr. Clayborn Bigness as a part of collaborative care agreement.   Sherry Bergman R. Valetta Fuller, MSN, FNP-C Internal medicine

## 2021-06-19 ENCOUNTER — Telehealth: Payer: Self-pay

## 2021-06-19 ENCOUNTER — Other Ambulatory Visit: Payer: Self-pay

## 2021-06-19 DIAGNOSIS — R300 Dysuria: Secondary | ICD-10-CM | POA: Diagnosis not present

## 2021-06-19 NOTE — Telephone Encounter (Signed)
Pt called that she feels she had uti symptoms advised her that we put order for UA in epic she can go head do that and also drink plenty of water and all [place] back tomorrow

## 2021-06-20 LAB — UA/M W/RFLX CULTURE, ROUTINE
Bilirubin, UA: NEGATIVE
Glucose, UA: NEGATIVE
Ketones, UA: NEGATIVE
Leukocytes,UA: NEGATIVE
Nitrite, UA: NEGATIVE
Protein,UA: NEGATIVE
Specific Gravity, UA: 1.005 (ref 1.005–1.030)
Urobilinogen, Ur: 0.2 mg/dL (ref 0.2–1.0)
pH, UA: 7 (ref 5.0–7.5)

## 2021-06-20 LAB — MICROSCOPIC EXAMINATION
Bacteria, UA: NONE SEEN
Casts: NONE SEEN /lpf
Epithelial Cells (non renal): NONE SEEN /hpf (ref 0–10)
RBC, Urine: NONE SEEN /hpf (ref 0–2)
WBC, UA: NONE SEEN /hpf (ref 0–5)

## 2021-06-26 ENCOUNTER — Ambulatory Visit: Payer: PPO | Admitting: Nurse Practitioner

## 2021-06-26 DIAGNOSIS — E782 Mixed hyperlipidemia: Secondary | ICD-10-CM | POA: Diagnosis not present

## 2021-06-26 DIAGNOSIS — I10 Essential (primary) hypertension: Secondary | ICD-10-CM | POA: Diagnosis not present

## 2021-06-26 DIAGNOSIS — J209 Acute bronchitis, unspecified: Secondary | ICD-10-CM | POA: Diagnosis not present

## 2021-07-01 DIAGNOSIS — Z882 Allergy status to sulfonamides status: Secondary | ICD-10-CM | POA: Diagnosis not present

## 2021-07-01 DIAGNOSIS — F419 Anxiety disorder, unspecified: Secondary | ICD-10-CM | POA: Diagnosis not present

## 2021-07-01 DIAGNOSIS — E538 Deficiency of other specified B group vitamins: Secondary | ICD-10-CM | POA: Diagnosis not present

## 2021-07-01 DIAGNOSIS — R0600 Dyspnea, unspecified: Secondary | ICD-10-CM | POA: Diagnosis not present

## 2021-07-01 DIAGNOSIS — C9001 Multiple myeloma in remission: Secondary | ICD-10-CM | POA: Diagnosis not present

## 2021-07-01 DIAGNOSIS — E041 Nontoxic single thyroid nodule: Secondary | ICD-10-CM | POA: Diagnosis not present

## 2021-07-01 DIAGNOSIS — Z885 Allergy status to narcotic agent status: Secondary | ICD-10-CM | POA: Diagnosis not present

## 2021-07-01 DIAGNOSIS — F32A Depression, unspecified: Secondary | ICD-10-CM | POA: Diagnosis not present

## 2021-07-01 DIAGNOSIS — Z5112 Encounter for antineoplastic immunotherapy: Secondary | ICD-10-CM | POA: Diagnosis not present

## 2021-07-01 DIAGNOSIS — Z880 Allergy status to penicillin: Secondary | ICD-10-CM | POA: Diagnosis not present

## 2021-07-01 DIAGNOSIS — D61810 Antineoplastic chemotherapy induced pancytopenia: Secondary | ICD-10-CM | POA: Diagnosis not present

## 2021-07-01 DIAGNOSIS — C9002 Multiple myeloma in relapse: Secondary | ICD-10-CM | POA: Diagnosis not present

## 2021-07-01 DIAGNOSIS — H3530 Unspecified macular degeneration: Secondary | ICD-10-CM | POA: Diagnosis not present

## 2021-07-01 DIAGNOSIS — C9000 Multiple myeloma not having achieved remission: Secondary | ICD-10-CM | POA: Diagnosis not present

## 2021-07-01 DIAGNOSIS — Z881 Allergy status to other antibiotic agents status: Secondary | ICD-10-CM | POA: Diagnosis not present

## 2021-07-01 DIAGNOSIS — Z886 Allergy status to analgesic agent status: Secondary | ICD-10-CM | POA: Diagnosis not present

## 2021-07-07 DIAGNOSIS — H353221 Exudative age-related macular degeneration, left eye, with active choroidal neovascularization: Secondary | ICD-10-CM | POA: Diagnosis not present

## 2021-07-11 DIAGNOSIS — H26491 Other secondary cataract, right eye: Secondary | ICD-10-CM | POA: Diagnosis not present

## 2021-07-11 DIAGNOSIS — H40003 Preglaucoma, unspecified, bilateral: Secondary | ICD-10-CM | POA: Diagnosis not present

## 2021-07-28 ENCOUNTER — Other Ambulatory Visit: Payer: Self-pay | Admitting: Internal Medicine

## 2021-07-28 DIAGNOSIS — I10 Essential (primary) hypertension: Secondary | ICD-10-CM

## 2021-07-29 DIAGNOSIS — Z5112 Encounter for antineoplastic immunotherapy: Secondary | ICD-10-CM | POA: Diagnosis not present

## 2021-07-29 DIAGNOSIS — R0600 Dyspnea, unspecified: Secondary | ICD-10-CM | POA: Diagnosis not present

## 2021-07-29 DIAGNOSIS — C9000 Multiple myeloma not having achieved remission: Secondary | ICD-10-CM | POA: Diagnosis not present

## 2021-08-06 DIAGNOSIS — H353221 Exudative age-related macular degeneration, left eye, with active choroidal neovascularization: Secondary | ICD-10-CM | POA: Diagnosis not present

## 2021-08-07 ENCOUNTER — Telehealth: Payer: Self-pay | Admitting: Pharmacist

## 2021-08-07 NOTE — Progress Notes (Addendum)
    Chronic Care Management Pharmacy Assistant   Name: EVALEE GERARD  MRN: [id number] DOB: 13-Apr-1946  Reason for Encounter: General Disease State Call   Conditions to be addressed/monitored: HTN, HLD, GAD, Multiple myeloma.  Recent office visits:  06/18/21 [name], NP. For follow-up. No medication changes.   Recent consult visits:  07/01/21 Hematology And Onocology Rae Roam [facility]. No medication changes.   Hospital visits:  None since 06/10/21.   Medications: Outpatient Encounter Medications as of 08/07/2021  Medication Sig   acetaminophen (TYLENOL) 500 MG tablet Take 650 mg by mouth in the morning and at bedtime.   ALPRAZolam (XANAX) 0.25 MG tablet Take 1 tablet (0.25 mg total) by mouth at bedtime as needed.   atorvastatin (LIPITOR) 10 MG tablet Take 1 tablet by mouth once daily   Calcium Carbonate-Vitamin D 600-400 MG-UNIT tablet Take 1 tablet by mouth daily.   Cranberry 500 MG CAPS Take 500 mg by mouth every morning.   DARATUMUMAB IV Inject into the vein every 30 (thirty) days.   dicyclomine (BENTYL) 10 MG capsule TAKE 1 CAPSULE BY MOUTH THREE TIMES DAILY AS NEEDED FOR  SPASMS.  TAKE  WITH  MEALS   diphenhydrAMINE (BENADRYL) 25 MG tablet Take 25 mg by mouth daily as needed.   docusate (COLACE) 50 MG/5ML liquid Take by mouth daily as needed for mild constipation. (Patient not taking: Reported on 04/16/2021)   ELIQUIS 5 MG TABS tablet Take 1 tablet by mouth 2 (two) times daily.   fluticasone (FLONASE) 50 MCG/ACT nasal spray Place into both nostrils daily.   ipratropium-albuterol (DUONEB) 0.5-2.5 (3) MG/3ML SOLN Take 3 mLs by nebulization every 6 (six) hours as needed.   lisinopril (ZESTRIL) 5 MG tablet Take 1 tablet by mouth once daily   meclizine (ANTIVERT) 25 MG tablet Take 25 mg by mouth as needed for dizziness.   Multiple Vitamins-Minerals (PRESERVISION AREDS PO) Take 1 tablet by mouth every morning.   omeprazole (PRILOSEC) 20 MG capsule Take 20 mg by mouth  daily.   ondansetron (ZOFRAN-ODT) 4 MG disintegrating tablet DISSOLVE 1 TABLET IN MOUTH EVERY 8 HOURS AS NEEDED FOR NAUSEA   polyethylene glycol (MIRALAX / GLYCOLAX) packet Take 17 g by mouth daily as needed.   pomalidomide (POMALYST) 4 MG capsule Take 1 capsule by mouth once daily for days 1-21. 7 days rest.   prochlorperazine (COMPAZINE) 5 MG tablet Take 5 mg by mouth every 8 (eight) hours as needed.   promethazine (PHENERGAN) 25 MG tablet Take 1 tablet (25 mg total) by mouth every 6 (six) hours as needed for nausea or vomiting.   senna (SENOKOT) 8.6 MG tablet Take 1 tablet by mouth daily as needed for constipation. (Patient not taking: Reported on 04/16/2021)   valACYclovir (VALTREX) 500 MG tablet Take 500 mg by mouth daily.   vitamin B-12 (CYANOCOBALAMIN) 500 MCG tablet Take 500 mcg by mouth daily.   No facility-administered encounter medications on file as of 08/07/2021.   GEN CALL: Patient stated she is doing pretty good with her cancer treatments. She stated she hasn't had any nausea but she has medications if she does experience any nausea.Patient stated she does not have any questions or concerns about her medications.   Star Rating Drugs: Lisinopril 5 mg 90 DS 07/31/21 Atorvastatin 10 mg 90 DS 07/28/21.  Follow-Up:Pharmacist Review  Charlann Lange, RMA Clinical Pharmacist Assistant [phone]  5 minutes spent in review, coordination, and documentation.  Reviewed by: Beverly Milch, PharmD Clinical Pharmacist [phone]

## 2021-08-08 DIAGNOSIS — C9001 Multiple myeloma in remission: Secondary | ICD-10-CM | POA: Diagnosis not present

## 2021-08-11 ENCOUNTER — Encounter: Payer: PPO | Admitting: Dermatology

## 2021-08-14 DIAGNOSIS — H26491 Other secondary cataract, right eye: Secondary | ICD-10-CM | POA: Diagnosis not present

## 2021-08-26 DIAGNOSIS — Z5112 Encounter for antineoplastic immunotherapy: Secondary | ICD-10-CM | POA: Diagnosis not present

## 2021-08-26 DIAGNOSIS — R0600 Dyspnea, unspecified: Secondary | ICD-10-CM | POA: Diagnosis not present

## 2021-08-26 DIAGNOSIS — C9001 Multiple myeloma in remission: Secondary | ICD-10-CM | POA: Diagnosis not present

## 2021-08-26 DIAGNOSIS — C9000 Multiple myeloma not having achieved remission: Secondary | ICD-10-CM | POA: Diagnosis not present

## 2021-08-27 DIAGNOSIS — I10 Essential (primary) hypertension: Secondary | ICD-10-CM | POA: Diagnosis not present

## 2021-08-27 DIAGNOSIS — E782 Mixed hyperlipidemia: Secondary | ICD-10-CM | POA: Diagnosis not present

## 2021-09-08 DIAGNOSIS — H353221 Exudative age-related macular degeneration, left eye, with active choroidal neovascularization: Secondary | ICD-10-CM | POA: Diagnosis not present

## 2021-09-23 DIAGNOSIS — C9000 Multiple myeloma not having achieved remission: Secondary | ICD-10-CM | POA: Diagnosis not present

## 2021-09-23 DIAGNOSIS — C9001 Multiple myeloma in remission: Secondary | ICD-10-CM | POA: Diagnosis not present

## 2021-09-23 DIAGNOSIS — Z5112 Encounter for antineoplastic immunotherapy: Secondary | ICD-10-CM | POA: Diagnosis not present

## 2021-09-23 DIAGNOSIS — E538 Deficiency of other specified B group vitamins: Secondary | ICD-10-CM | POA: Diagnosis not present

## 2021-09-23 DIAGNOSIS — R0600 Dyspnea, unspecified: Secondary | ICD-10-CM | POA: Diagnosis not present

## 2021-10-10 NOTE — Progress Notes (Signed)
Chronic Care Management Pharmacy Note  10/23/2021 Name:  Sherry Oneal MRN:  [id number] DOB:  [birth year]  Subjective: Sherry Oneal is an 75 y.o. year old female who is a primary patient of Humphrey Rolls Timoteo Gaul, MD.  The CCM team was consulted for assistance with disease management and care coordination needs.    Engaged with patient by telephone for follow up visit in response to provider referral for pharmacy case management and/or care coordination services.   Consent to Services:  The patient was given the following information about Chronic Care Management services today, agreed to services, and gave verbal consent: 1. CCM service includes personalized support from designated clinical staff supervised by the primary care provider, including individualized plan of care and coordination with other care providers 2. 24/7 contact phone numbers for assistance for urgent and routine care needs. 3. Service will only be billed when office clinical staff spend 20 minutes or more in a month to coordinate care. 4. Only one practitioner may furnish and bill the service in a calendar month. 5.The patient may stop CCM services at any time (effective at the end of the month) by phone call to the office staff. 6. The patient will be responsible for cost sharing (co-pay) of up to 20% of the service fee (after annual deductible is met). Patient agreed to services and consent obtained.  Patient Care Team: Lavera Guise, MD as PCP - General (Internal Medicine) Edythe Clarity, [facility] as Pharmacist (Pharmacist)  Recent office visits: 03/21/21 [place] [facility]) - cough and sore throat treated with Zpak  Recent consult visits: Oncology visits - no information  Hospital visits: 03/23/21 (ED) - cough and SOB - workup was all negative, sent home on Tessalon Perles and codeine cough syrup.    Objective:  Lab Results  Component Value Date   CREATININE 0.87 03/23/2021   BUN 12 03/23/2021   GFRNONAA >60 03/23/2021   GFRAA  58 (L) 04/20/2020   NA 140 03/23/2021   K 3.8 03/23/2021   CALCIUM 8.8 (L) 03/23/2021   CO2 24 03/23/2021   GLUCOSE 97 03/23/2021    No results found for: HGBA1C, FRUCTOSAMINE, GFR, MICROALBUR  Last diabetic Eye exam: No results found for: HMDIABEYEEXA  Last diabetic Foot exam: No results found for: HMDIABFOOTEX   No results found for: CHOL, HDL, LDLCALC, LDLDIRECT, TRIG, CHOLHDL  Hepatic Function Latest Ref Rng & Units 04/20/2020 08/04/2019 08/03/2019  Total Protein 6.5 - 8.1 g/dL 6.6 4.3(L) 5.3(L)  Albumin 3.5 - 5.0 g/dL 4.2 2.4(L) 3.4(L)  AST 15 - 41 U/L 27 19 23   ALT 0 - 44 U/L 19 16 20   Alk Phosphatase 38 - 126 U/L 57 39 61  Total Bilirubin 0.3 - 1.2 mg/dL 0.9 1.0 0.7    No results found for: TSH, FREET4  CBC Latest Ref Rng & Units 03/23/2021 04/20/2020 08/04/2019  WBC 4.0 - 10.5 K/uL 7.2 15.4(H) 5.9  Hemoglobin 12.0 - 15.0 g/dL 12.3 13.9 10.3(L)  Hematocrit 36.0 - 46.0 % 37.8 42.0 31.0(L)  Platelets 150 - 400 K/uL 170 226 113(L)    No results found for: VD25OH  Clinical ASCVD: No  The 10-year ASCVD risk score (Arnett DK, et al., 2019) is: 18.5%   Values used to calculate the score:     Age: 75 years     Sex: Female     Is Non-Hispanic African American: No     Diabetic: No     Tobacco smoker: No     Systolic  Blood Pressure: 120 mmHg     Is BP treated: Yes     HDL Cholesterol: 42 mg/dL     Total Cholesterol: 184 mg/dL    Depression screen [facility] 2/9 03/21/2021 11/28/2020 04/16/2020  Decreased Interest 0 0 0  Down, Depressed, Hopeless 0 0 0  PHQ - 2 Score 0 0 0     Social History   Tobacco Use  Smoking Status Never  Smokeless Tobacco Never   BP Readings from Last 3 Encounters:  06/18/21 128/63  03/23/21 (!) 150/75  11/28/20 (!) 144/69   Pulse Readings from Last 3 Encounters:  06/18/21 (!) 57  03/23/21 71  11/28/20 81   Wt Readings from Last 3 Encounters:  06/18/21 138 lb (62.6 kg)  03/23/21 137 lb (62.1 kg)  03/21/21 136 lb (61.7 kg)   BMI Readings from  Last 3 Encounters:  06/18/21 23.69 kg/m  03/23/21 23.15 kg/m  03/21/21 23.34 kg/m    Assessment/Interventions: Review of patient past medical history, allergies, medications, health status, including review of consultants reports, laboratory and other test data, was performed as part of comprehensive evaluation and provision of chronic care management services.   SDOH:  (Social Determinants of Health) assessments and interventions performed: Yes  SDOH Screenings   Alcohol Screen: Low Risk    Last Alcohol Screening Score (AUDIT): 1  Depression (PHQ2-9): Low Risk    PHQ-2 Score: 0  Financial Resource Strain: Low Risk    Difficulty of Paying Living Expenses: Not very hard  Food Insecurity: Not on file  Housing: Not on file  Physical Activity: Not on file  Social Connections: Not on file  Stress: Not on file  Tobacco Use: Low Risk    Smoking Tobacco Use: Never   Smokeless Tobacco Use: Never   Passive Exposure: Not on file  Transportation Needs: Not on file    CCM Care Plan  Allergies  Allergen Reactions   Aspirin Other (See Comments)    Causes internal bleeding per patient   Atenolol     "wierd feeling"   Doxycycline     Upset stomach    Epinephrine     "Shaking"   Ivp Dye [Iodinated Diagnostic Agents] Hypertension    Per tech conversation with PT, pt states that the last time she had IV dye she had increased BP and tachycardia.      Ketorolac    Nsaids     Bleeding   Penicillins     rash   Sulfa Antibiotics     Rash    Tolmetin     Bleeding   Tramadol     Numb feeling    Procaine Palpitations    States this was added due to palpitations and shakes after receiving local anesthetic containing epinephrine.     Medications Reviewed Today     Reviewed by Edythe Clarity, [facility] (Pharmacist) on 10/23/21 at 82  Med List Status: <None>   Medication Order Taking? Sig Documenting Provider Last Dose Status Informant  acetaminophen (TYLENOL) 500 MG tablet  [id number] Yes Take 650 mg by mouth in the morning and at bedtime. [provider] Taking Active Spouse/Significant Other  ALPRAZolam (XANAX) 0.25 MG tablet [id number] Yes Take 1 tablet (0.25 mg total) by mouth at bedtime as needed. [name], NP Taking Active   atorvastatin (LIPITOR) 10 MG tablet [id number] Yes Take 1 tablet by mouth once daily Lavera Guise, MD Taking Active   Calcium Carbonate-Vitamin D 600-400 MG-UNIT tablet [id number] Yes Take 1 tablet  by mouth daily. [provider] Taking Active Spouse/Significant Other  Cranberry 500 MG CAPS [id number] Yes Take 500 mg by mouth every morning. [provider] Taking Active Spouse/Significant Other  DARATUMUMAB IV [id number] Yes Inject into the vein every 30 (thirty) days. [provider] Taking Active Spouse/Significant Other  dicyclomine (BENTYL) 10 MG capsule [id number] Yes TAKE 1 CAPSULE BY MOUTH 3 TIMES DAILY AS NEEDED FOR SPASMS. TAKE WITH MEALS. Lavera Guise, MD Taking Active   diphenhydrAMINE (BENADRYL) 25 MG tablet [id number] Yes Take 25 mg by mouth daily as needed. [provider] Taking Active Spouse/Significant Other  docusate (COLACE) 50 MG/5ML liquid [id number] Yes Take by mouth daily as needed for mild constipation. [provider] Taking Active   ELIQUIS 5 MG TABS tablet [id number] Yes Take 1 tablet by mouth 2 (two) times daily. [provider] Taking Active Spouse/Significant Other  fluticasone (FLONASE) 50 MCG/ACT nasal spray [id number] Yes Place into both nostrils daily. [provider] Taking Active Spouse/Significant Other  ipratropium-albuterol (DUONEB) 0.5-2.5 (3) MG/3ML SOLN [id number] Yes Take 3 mLs by nebulization every 6 (six) hours as needed. Kendell Bane, NP Taking Active Spouse/Significant Other  lisinopril (ZESTRIL) 5 MG tablet [id number] Yes Take 1 tablet by mouth once daily Lavera Guise, MD Taking Active   meclizine (ANTIVERT) 25 MG tablet  [id number] Yes Take 25 mg by mouth as needed for dizziness. [provider] Taking Active   Multiple Vitamins-Minerals (PRESERVISION AREDS PO) [id number] Yes Take 1 tablet by mouth every morning. [provider] Taking Active Spouse/Significant Other  omeprazole (PRILOSEC) 20 MG capsule [id number] Yes Take 20 mg by mouth daily. [provider] Taking Active Spouse/Significant Other  ondansetron (ZOFRAN-ODT) 4 MG disintegrating tablet [id number] Yes DISSOLVE 1 TABLET IN MOUTH EVERY 8 HOURS AS NEEDED FOR NAUSEA [provider] Taking Active Spouse/Significant Other  polyethylene glycol (MIRALAX / GLYCOLAX) packet [id number] Yes Take 17 g by mouth daily as needed. [provider] Taking Active Spouse/Significant Other  pomalidomide (POMALYST) 4 MG capsule [id number] Yes Take 1 capsule by mouth once daily for days 1-21. 7 days rest. [provider] Taking Active Spouse/Significant Other  prochlorperazine (COMPAZINE) 5 MG tablet [id number] Yes Take 5 mg by mouth every 8 (eight) hours as needed. [provider] Taking Active Spouse/Significant Other  promethazine (PHENERGAN) 25 MG tablet [id number] Yes Take 1 tablet (25 mg total) by mouth every 6 (six) hours as needed for nausea or vomiting. Harvest Dark, MD Taking Active   senna (SENOKOT) 8.6 MG tablet [id number] Yes Take 1 tablet by mouth daily as needed for constipation. [provider] Taking Active   valACYclovir (VALTREX) 500 MG tablet [id number] Yes Take 500 mg by mouth daily. [provider] Taking Active Spouse/Significant Other  vitamin B-12 (CYANOCOBALAMIN) 500 MCG tablet [id number] Yes Take 500 mcg by mouth daily. [provider] Taking Active             Patient Active Problem List   Diagnosis Date Noted   Macular degeneration of left eye 11/28/2020   Encounter for general adult medical examination with abnormal findings 11/20/2019   Acute colitis  08/03/2019   Screening for breast cancer 11/17/2018   Dysuria 11/17/2018   Acute pharyngitis 08/26/2018   Sore throat 08/26/2018   Generalized anxiety disorder 06/13/2018   HCAP (healthcare-associated pneumonia) 03/22/2018   Acute bronchitis 03/01/2018   Cough 03/01/2018   Flu-like symptoms 03/01/2018   Essential hypertension 03/01/2018   Hypogammaglobulinemia, acquired ([place]) 01/11/2018   Exertional  dyspnea 09/03/2017   Pancytopenia due to chemotherapy ([place]) 05/18/2017   LGI bleed 01/30/2017   Acute blood loss anemia 01/30/2017   Abdominal pain 01/30/2017   Multiple myeloma ([place]) 01/30/2017   Mixed hyperlipidemia 10/29/2016   Nephrolithiasis 09/73/5329   Uncertain tumor of kidney and ureter 04/04/2013    Immunization History  Administered Date(s) Administered   Influenza Inj Mdck Quad Pf 10/14/2018   Influenza, High Dose Seasonal PF 10/14/2018, 09/26/2019   Influenza-Unspecified 08/28/2014, 10/16/2017, 10/14/2018, 09/16/2020   PFIZER(Purple Top)SARS-COV-2 Vaccination 02/08/2020, 03/01/2020, 08/04/2020   Pneumococcal Conjugate-13 10/01/2016   Pneumococcal Polysaccharide-23 11/23/2017    Conditions to be addressed/monitored:  HTN, HLD, GAD, Multiple myeloma  Care Plan : General Pharmacy (Adult)  Updates made by Edythe Clarity, RPH since 10/23/2021 12:00 AM     Problem: HTN, HLD, GAD, Multiple myeloma   Priority: High  Onset Date: 04/16/2021     Long-Range Goal: Patient Stated   Start Date: 04/16/2021  Expected End Date: 10/16/2021  Recent Progress: On track  Priority: High  Note:   Current Barriers:  No current barriers on medications noted.  Pharmacist Clinical Goal(s):  Patient will achieve adherence to monitoring guidelines and medication adherence to achieve therapeutic efficacy maintain control of blood pressure as evidenced by home monitoring  contact provider office for questions/concerns as evidenced notation of same in electronic health record through  collaboration with PharmD and provider.   Interventions: 1:1 collaboration with Lavera Guise, MD regarding development and update of comprehensive plan of care as evidenced by provider attestation and co-signature Inter-disciplinary care team collaboration (see longitudinal plan of care) Comprehensive medication review performed; medication list updated in electronic medical record  Hypertension (BP goal <140/90) -Controlled -Current treatment: Lisinopril 24m -Medications previously tried: none noted -Current home readings: 110/65 is about her normal at home -Current exercise habits: tries to walk about 5 to 10 minutes per day, gets really tired but does make effort to do this whenever possible -Denies hypotensive/hypertensive symptoms -Educated on BP goals and benefits of medications for prevention of heart attack, stroke and kidney damage; Importance of home blood pressure monitoring; Symptoms of hypotension and importance of maintaining adequate hydration; -Counseled to monitor BP at home a few times per week, document, and provide log at future appointments -Recommended to continue current medication Recommended contacting providers if BP were consistently > 140/90.  Update 10/23/21 120/58 today She is checking BP about 3 times per week, reports is has been controlled lately.  Denies any issues with dizziness or HA.  Still trying to walk as much as she can.  She does continue to get tired but tries to do it anyways. Continue to monitor at home, no changes to meds at this time.   Hyperlipidemia: (LDL goal < 100) -Not ideally controlled -Current treatment: Atorvastatin 178m-Medications previously tried: none noted  -Current exercise habits: see above -Educated on Cholesterol goals;  Benefits of statin for ASCVD risk reduction; Importance of limiting foods high in cholesterol;  -Reviewed most recent lipid panel, slightly elevated -Would hesitate to increase statin dose due to  everything else she has going on,  Will continue to follow and track trends -Counseled on diet and exercise extensively Recommended to continue current medication  Depression/Anxiety (Goal: Minimize symptoms) -Controlled -Current treatment: Alprazolam 0.2534mrn -Medications previously tried/failed: none noted -Educated on Benefits of medication for symptom control  -Reports sleep is good, only takes Xanax as needed -Recommended to continue current medication  Multiple myeloma (Goal: Minimize symptoms) -Controlled -  Current treatment  Pomalidomide 42m capsule Daratumumab every 30 days Eliquis 518mBID Zofran ODT 52m552mrn Compazine 5mg61mn Promethazine 25mg16m -Medications previously tried: Velcade (d/c) -Patient takes Eliquis due to stroke risk from other chemo drugs. -She denies any abnormal bruising or bleeding.  -Reports nausea at bedtime and is now taking a Zofran about 30 minutes before she takes her Pomalyst which has been helping.  Will save Phenergan to last resort as it makes her really drowsy. -Recommended to continue current medication  -Reports Eliquis copay is manageable now as she hit catastrophic phase pretty early in January.  Discussed availability of PAP program and instructed her to reach out if this is ever necessary to ensure Eliquis therapy continues.  Update 10/22/21 Still having occasional nausea, saving Phenergan for last resort due to drowsiness.  She remains in catastrophic phase so medications are affordable at this time.  Having no difficulty obtaining meds from pharmacy and prefers to leave it the way it is for now.  Counseled on availability of [employer] for delivery and synchronization services. Continue current meds for now.   Patient Goals/Self-Care Activities Patient will:  - take medications as prescribed focus on medication adherence by pill count check blood pressure daily, document, and provide at future appointments  Follow Up Plan: The  care management team will reach out to the patient again over the next 180 days.             Medication Assistance: None required.  Patient affirms current coverage meets needs.  [facility] [address] [place] [id number]: [phone] pill box? No - has her own organization methods Pt endorses 100% compliance  We discussed: Benefits of medication synchronization, packaging and delivery as well as enhanced pharmacist oversight with [employer]. Patient decided to: Continue current medication management strategy  Care Plan and Follow Up Patient Decision:  Patient agrees to Care Plan and Follow-up.  Plan: The care management team will reach out to the patient again over the next 180 days.  ChrisBeverly MilchrmD Clinical Pharmacist [facility])[phone]

## 2021-10-13 DIAGNOSIS — H353221 Exudative age-related macular degeneration, left eye, with active choroidal neovascularization: Secondary | ICD-10-CM | POA: Diagnosis not present

## 2021-10-16 ENCOUNTER — Other Ambulatory Visit: Payer: Self-pay | Admitting: Internal Medicine

## 2021-10-16 DIAGNOSIS — K529 Noninfective gastroenteritis and colitis, unspecified: Secondary | ICD-10-CM

## 2021-10-16 DIAGNOSIS — I10 Essential (primary) hypertension: Secondary | ICD-10-CM

## 2021-10-21 DIAGNOSIS — Z886 Allergy status to analgesic agent status: Secondary | ICD-10-CM | POA: Diagnosis not present

## 2021-10-21 DIAGNOSIS — T451X5A Adverse effect of antineoplastic and immunosuppressive drugs, initial encounter: Secondary | ICD-10-CM | POA: Diagnosis not present

## 2021-10-21 DIAGNOSIS — Z880 Allergy status to penicillin: Secondary | ICD-10-CM | POA: Diagnosis not present

## 2021-10-21 DIAGNOSIS — H3530 Unspecified macular degeneration: Secondary | ICD-10-CM | POA: Diagnosis not present

## 2021-10-21 DIAGNOSIS — Z5112 Encounter for antineoplastic immunotherapy: Secondary | ICD-10-CM | POA: Diagnosis not present

## 2021-10-21 DIAGNOSIS — D701 Agranulocytosis secondary to cancer chemotherapy: Secondary | ICD-10-CM | POA: Diagnosis not present

## 2021-10-21 DIAGNOSIS — Z79899 Other long term (current) drug therapy: Secondary | ICD-10-CM | POA: Diagnosis not present

## 2021-10-21 DIAGNOSIS — R110 Nausea: Secondary | ICD-10-CM | POA: Diagnosis not present

## 2021-10-21 DIAGNOSIS — Z882 Allergy status to sulfonamides status: Secondary | ICD-10-CM | POA: Diagnosis not present

## 2021-10-21 DIAGNOSIS — D61810 Antineoplastic chemotherapy induced pancytopenia: Secondary | ICD-10-CM | POA: Diagnosis not present

## 2021-10-21 DIAGNOSIS — C9001 Multiple myeloma in remission: Secondary | ICD-10-CM | POA: Diagnosis not present

## 2021-10-21 DIAGNOSIS — R0609 Other forms of dyspnea: Secondary | ICD-10-CM | POA: Diagnosis not present

## 2021-10-21 DIAGNOSIS — E538 Deficiency of other specified B group vitamins: Secondary | ICD-10-CM | POA: Diagnosis not present

## 2021-10-21 DIAGNOSIS — F419 Anxiety disorder, unspecified: Secondary | ICD-10-CM | POA: Diagnosis not present

## 2021-10-21 DIAGNOSIS — Z881 Allergy status to other antibiotic agents status: Secondary | ICD-10-CM | POA: Diagnosis not present

## 2021-10-21 DIAGNOSIS — Z885 Allergy status to narcotic agent status: Secondary | ICD-10-CM | POA: Diagnosis not present

## 2021-10-21 DIAGNOSIS — E041 Nontoxic single thyroid nodule: Secondary | ICD-10-CM | POA: Diagnosis not present

## 2021-10-21 DIAGNOSIS — D801 Nonfamilial hypogammaglobulinemia: Secondary | ICD-10-CM | POA: Diagnosis not present

## 2021-10-21 DIAGNOSIS — C9000 Multiple myeloma not having achieved remission: Secondary | ICD-10-CM | POA: Diagnosis not present

## 2021-10-21 DIAGNOSIS — Z5111 Encounter for antineoplastic chemotherapy: Secondary | ICD-10-CM | POA: Diagnosis not present

## 2021-10-21 DIAGNOSIS — Z9109 Other allergy status, other than to drugs and biological substances: Secondary | ICD-10-CM | POA: Diagnosis not present

## 2021-10-22 ENCOUNTER — Ambulatory Visit: Payer: PPO | Admitting: Pharmacist

## 2021-10-22 DIAGNOSIS — C9001 Multiple myeloma in remission: Secondary | ICD-10-CM

## 2021-10-22 DIAGNOSIS — I10 Essential (primary) hypertension: Secondary | ICD-10-CM

## 2021-10-23 NOTE — Patient Instructions (Addendum)
Visit Information   Goals Addressed             This Visit's Progress    Track and Manage My Blood Pressure-Hypertension   On track    Timeframe:  Long-Range Goal Priority:  High Start Date:  04/16/21                           Expected End Date:  10/16/21                     Follow Up Date 07/26/21   - check blood pressure 3 times per week - choose a place to take my blood pressure (home, clinic or office, retail store) - write blood pressure results in a log or diary    Why is this important?   You won't feel high blood pressure, but it can still hurt your blood vessels.  High blood pressure can cause heart or kidney problems. It can also cause a stroke.  Making lifestyle changes like losing a little weight or eating less salt will help.  Checking your blood pressure at home and at different times of the day can help to control blood pressure.  If the doctor prescribes medicine remember to take it the way the doctor ordered.  Call the office if you cannot afford the medicine or if there are questions about it.     Notes:        Patient Care Plan: General Pharmacy (Adult)     Problem Identified: HTN, HLD, GAD, Multiple myeloma   Priority: High  Onset Date: 04/16/2021     Long-Range Goal: Patient Stated   Start Date: 04/16/2021  Expected End Date: 10/16/2021  Recent Progress: On track  Priority: High  Note:   Current Barriers:  No current barriers on medications noted.  Pharmacist Clinical Goal(s):  Patient will achieve adherence to monitoring guidelines and medication adherence to achieve therapeutic efficacy maintain control of blood pressure as evidenced by home monitoring  contact provider office for questions/concerns as evidenced notation of same in electronic health record through collaboration with PharmD and provider.   Interventions: 1:1 collaboration with Lavera Guise, MD regarding development and update of comprehensive plan of care as evidenced by  provider attestation and co-signature Inter-disciplinary care team collaboration (see longitudinal plan of care) Comprehensive medication review performed; medication list updated in electronic medical record  Hypertension (BP goal <140/90) -Controlled -Current treatment: Lisinopril 53m -Medications previously tried: none noted -Current home readings: 110/65 is about her normal at home -Current exercise habits: tries to walk about 5 to 10 minutes per day, gets really tired but does make effort to do this whenever possible -Denies hypotensive/hypertensive symptoms -Educated on BP goals and benefits of medications for prevention of heart attack, stroke and kidney damage; Importance of home blood pressure monitoring; Symptoms of hypotension and importance of maintaining adequate hydration; -Counseled to monitor BP at home a few times per week, document, and provide log at future appointments -Recommended to continue current medication Recommended contacting providers if BP were consistently > 140/90.  Update 10/23/21 120/58 today She is checking BP about 3 times per week, reports is has been controlled lately.  Denies any issues with dizziness or HA.  Still trying to walk as much as she can.  She does continue to get tired but tries to do it anyways. Continue to monitor at home, no changes to meds at this time.   Hyperlipidemia: (  LDL goal < 100) -Not ideally controlled -Current treatment: Atorvastatin 88m -Medications previously tried: none noted  -Current exercise habits: see above -Educated on Cholesterol goals;  Benefits of statin for ASCVD risk reduction; Importance of limiting foods high in cholesterol;  -Reviewed most recent lipid panel, slightly elevated -Would hesitate to increase statin dose due to everything else she has going on,  Will continue to follow and track trends -Counseled on diet and exercise extensively Recommended to continue current  medication  Depression/Anxiety (Goal: Minimize symptoms) -Controlled -Current treatment: Alprazolam 0.22mprn -Medications previously tried/failed: none noted -Educated on Benefits of medication for symptom control  -Reports sleep is good, only takes Xanax as needed -Recommended to continue current medication  Multiple myeloma (Goal: Minimize symptoms) -Controlled -Current treatment  Pomalidomide 25m58mapsule Daratumumab every 30 days Eliquis 5mg225mD Zofran ODT 25mg 68m Compazine 5mg p75mPromethazine 25mg p12mMedications previously tried: Velcade (d/c) -Patient takes Eliquis due to stroke risk from other chemo drugs. -She denies any abnormal bruising or bleeding.  -Reports nausea at bedtime and is now taking a Zofran about 30 minutes before she takes her Pomalyst which has been helping.  Will save Phenergan to last resort as it makes her really drowsy. -Recommended to continue current medication  -Reports Eliquis copay is manageable now as she hit catastrophic phase pretty early in January.  Discussed availability of PAP program and instructed her to reach out if this is ever necessary to ensure Eliquis therapy continues.  Update 10/22/21 Still having occasional nausea, saving Phenergan for last resort due to drowsiness.  She remains in catastrophic phase so medications are affordable at this time.  Having no difficulty obtaining meds from pharmacy and prefers to leave it the way it is for now.  Counseled on availability of [employer] for delivery and synchronization services. Continue current meds for now.   Patient Goals/Self-Care Activities Patient will:  - take medications as prescribed focus on medication adherence by pill count check blood pressure daily, document, and provide at future appointments  Follow Up Plan: The care management team will reach out to the patient again over the next 180 days.            Patient verbalizes understanding of instructions provided  today and agrees to view in MyChartNorth LibertyPPA compliant phone message was left for the patient providing contact information and requesting a return call. 6 months  ChristiEdythe Clarity[address]

## 2021-10-27 DIAGNOSIS — I10 Essential (primary) hypertension: Secondary | ICD-10-CM | POA: Diagnosis not present

## 2021-10-27 DIAGNOSIS — E782 Mixed hyperlipidemia: Secondary | ICD-10-CM | POA: Diagnosis not present

## 2021-11-13 ENCOUNTER — Ambulatory Visit (INDEPENDENT_AMBULATORY_CARE_PROVIDER_SITE_OTHER): Payer: PPO | Admitting: Dermatology

## 2021-11-13 ENCOUNTER — Other Ambulatory Visit: Payer: Self-pay

## 2021-11-13 DIAGNOSIS — D229 Melanocytic nevi, unspecified: Secondary | ICD-10-CM

## 2021-11-13 DIAGNOSIS — L578 Other skin changes due to chronic exposure to nonionizing radiation: Secondary | ICD-10-CM

## 2021-11-13 DIAGNOSIS — L814 Other melanin hyperpigmentation: Secondary | ICD-10-CM | POA: Diagnosis not present

## 2021-11-13 DIAGNOSIS — D239 Other benign neoplasm of skin, unspecified: Secondary | ICD-10-CM

## 2021-11-13 DIAGNOSIS — I781 Nevus, non-neoplastic: Secondary | ICD-10-CM | POA: Diagnosis not present

## 2021-11-13 DIAGNOSIS — Z1283 Encounter for screening for malignant neoplasm of skin: Secondary | ICD-10-CM

## 2021-11-13 DIAGNOSIS — R238 Other skin changes: Secondary | ICD-10-CM

## 2021-11-13 DIAGNOSIS — D2371 Other benign neoplasm of skin of right lower limb, including hip: Secondary | ICD-10-CM

## 2021-11-13 DIAGNOSIS — D1801 Hemangioma of skin and subcutaneous tissue: Secondary | ICD-10-CM

## 2021-11-13 DIAGNOSIS — L565 Disseminated superficial actinic porokeratosis (DSAP): Secondary | ICD-10-CM

## 2021-11-13 DIAGNOSIS — L821 Other seborrheic keratosis: Secondary | ICD-10-CM

## 2021-11-13 DIAGNOSIS — L820 Inflamed seborrheic keratosis: Secondary | ICD-10-CM | POA: Diagnosis not present

## 2021-11-13 DIAGNOSIS — D2361 Other benign neoplasm of skin of right upper limb, including shoulder: Secondary | ICD-10-CM | POA: Diagnosis not present

## 2021-11-13 NOTE — Patient Instructions (Addendum)
Recommend Vitamin D 600-800 IU once daily.   If You Need Anything After Your Visit  If you have any questions or concerns for your doctor, please call our main line at [phone] and press option 4 to reach your doctor's medical assistant. If no one answers, please leave a voicemail as directed and we will return your call as soon as possible. Messages left after 4 pm will be answered the following business day.   You may also send [place] a message via [place]. We typically respond to MyChart messages within 1-2 business days.  For prescription refills, please ask your pharmacy to contact our office. Our fax number is [phone].  If you have an urgent issue when the clinic is closed that cannot wait until the next business day, you can page your doctor at the number below.    Please note that while we do our best to be available for urgent issues outside of office hours, we are not available 24/7.   If you have an urgent issue and are unable to reach [place], you may choose to seek medical care at your doctor's office, retail clinic, urgent care center, or emergency room.  If you have a medical emergency, please immediately call 911 or go to the emergency department.  Pager Numbers  - Dr. Nehemiah Massed: [phone]  - Dr. Laurence Ferrari: [phone]  - Dr. Nicole Kindred: [phone]  In the event of inclement weather, please call our main line at [phone] for an update on the status of any delays or closures.  Dermatology Medication Tips: Please keep the boxes that topical medications come in in order to help keep track of the instructions about where and how to use these. Pharmacies typically print the medication instructions only on the boxes and not directly on the medication tubes.   If your medication is too expensive, please contact our office at [phone] option 4 or send [place] a message through [place].   We are unable to tell what your co-pay for medications will be in advance as this is  different depending on your insurance coverage. However, we may be able to find a substitute medication at lower cost or fill out paperwork to get insurance to cover a needed medication.   If a prior authorization is required to get your medication covered by your insurance company, please allow [place] 1-2 business days to complete this process.  Drug prices often vary depending on where the prescription is filled and some pharmacies may offer cheaper prices.  The website www.goodrx.com contains coupons for medications through different pharmacies. The prices here do not account for what the cost may be with help from insurance (it may be cheaper with your insurance), but the website can give you the price if you did not use any insurance.  - You can print the associated coupon and take it with your prescription to the pharmacy.  - You may also stop by our office during regular business hours and pick up a GoodRx coupon card.  - If you need your prescription sent electronically to a different pharmacy, notify our office through [facility] or by phone at [phone] option 4.     Si Usted Necesita Algo Despus de Su Visita  Tambin puede enviarnos un mensaje a travs de Pharmacist, community. Por lo general respondemos a los mensajes de MyChart en el transcurso de 1 a 2 das hbiles.  Para renovar recetas, por favor pida a su farmacia que se ponga en contacto con nuestra oficina. Oswald Hillock  [facility] [phone].  Si tiene un asunto urgente cuando la clnica est cerrada y que no puede esperar hasta el siguiente da hbil, puede llamar/localizar a su doctor(a) al nmero que aparece a continuacin.   Por favor, tenga en cuenta que aunque hacemos todo lo posible para estar disponibles para asuntos urgentes fuera del horario de [place], no estamos disponibles las 24 horas del da, los 7 das de la [place].   Si tiene un problema urgente y no puede comunicarse con nosotros, puede optar por buscar  atencin mdica  en el consultorio de su doctor(a), en una clnica privada, en un centro de atencin urgente o en una sala de emergencias.  Si tiene Engineering geologist, por favor llame inmediatamente al 911 o vaya a la sala de emergencias.  Nmeros de bper  - Dr. Nehemiah Massed: [phone]  - Dra. Moye: [phone]  - Dra. Nicole Kindred: [phone]  En caso de inclemencias del [place], por favor llame a Johnsie Kindred principal al [phone] para una actualizacin sobre el [place] de cualquier retraso o cierre.  Consejos para la medicacin en dermatologa: Por favor, guarde las cajas en las que vienen los medicamentos de uso tpico para ayudarle a seguir las instrucciones sobre dnde y cmo usarlos. Las farmacias generalmente imprimen las instrucciones del medicamento slo en las cajas y no directamente en los tubos del [place].   Si su medicamento es muy caro, por favor, pngase en contacto con Zigmund Daniel llamando al [phone] y presione la opcin 4 o envenos un mensaje a travs de Pharmacist, community.   No podemos decirle cul ser su copago por los medicamentos por adelantado ya que esto es diferente dependiendo de la cobertura de su seguro. Sin embargo, es posible que podamos encontrar un medicamento sustituto a Electrical engineer un formulario para que el seguro cubra el medicamento que se considera necesario.   Si se requiere una autorizacin previa para que su compaa de seguros [place] su medicamento, por favor permtanos de 1 a 2 das hbiles para completar este proceso.  Los precios de los medicamentos varan con frecuencia dependiendo del Environmental consultant de dnde se surte la receta y alguna farmacias pueden ofrecer precios ms baratos.  El sitio web www.goodrx.com tiene cupones para medicamentos de Airline pilot. Los precios aqu no tienen en cuenta lo que podra costar con la ayuda del seguro (puede ser ms barato con su seguro), pero el sitio web puede darle el precio si no utiliz  Research scientist (physical sciences).  - Puede imprimir el cupn correspondiente y llevarlo con su receta a la farmacia.  - Tambin puede pasar por nuestra oficina durante el horario de atencin regular y Charity fundraiser una tarjeta de cupones de GoodRx.  - Si necesita que su receta se enve electrnicamente a una farmacia diferente, informe a nuestra oficina a travs de MyChart de [place] o por telfono llamando al [phone] y presione la opcin 4.

## 2021-11-13 NOTE — Progress Notes (Signed)
Follow-Up Visit   Subjective  Sherry Oneal is a 75 y.o. female who presents for the following: Annual Exam (Hx DSAP and AK's - patient would like to discuss treatment options for DSAP. ). The patient presents for Total-Body Skin Exam (TBSE) for skin cancer screening and mole check.  She is very bothered by her DSAP and inquires about treatment options.  The following portions of the chart were reviewed this encounter and updated as appropriate:   Tobacco  Allergies  Meds  Problems  Med Hx  Surg Hx  Fam Hx      Review of Systems:  No other skin or systemic complaints except as noted in HPI or Assessment and Plan.  Objective  Well appearing patient in no apparent distress; mood and affect are within normal limits.  A full examination was performed including scalp, head, eyes, ears, nose, lips, neck, chest, axillae, abdomen, back, buttocks, bilateral upper extremities, bilateral lower extremities, hands, feet, fingers, toes, fingernails, and toenails. All findings within normal limits unless otherwise noted below.  B/L arms and legs Many pink macules with peripheral scale  R lat thigh Firm pink/brown papulenodule with dimple sign.   Lower lip Blue macule.  R forearm x 3 (3) Erythematous macules with per scale   R forearm x 1 Erythematous keratotic or waxy stuck-on papule or plaque.    Assessment & Plan  DSAP (disseminated superficial actinic porokeratosis) B/L arms and legs  Chronic condition with duration over one year. Condition is bothersome to patient. Not currently at goal.  Discussed prescription Cholesterol mix from [facility] BID until clear then PRN thereafter. Pt defers at this time due to cost.  Also discussed prescription 5FU cream but pt defers due to expected irritation and inflammation.  Dermatofibroma R lat thigh  Benign growth possibly related to trauma, such as an insect bite.  Discussed removal (shave vrs excision), resulting scar and  risk of recurrence. Since not bothersome, will observe for now.  Venous lake of lip Lower lip  Benign-appearing.  Observation.  Call clinic for new or changing lesions.  Recommend daily use of broad spectrum spf 30+ sunscreen to sun-exposed areas.    Other benign neoplasm of skin, unspecified (3) R forearm x 3  Consistent with inflamed porokeratosis - symptomatic   Prior to procedure, discussed risks of blister formation, small wound, skin dyspigmentation, or rare scar following cryotherapy. Recommend Vaseline ointment to treated areas while healing.   Destruction of lesion - R forearm x 3 Complexity: simple   Destruction method: cryotherapy   Informed consent: discussed and consent obtained   Timeout:  patient name, date of birth, surgical site, and procedure verified Lesion destroyed using liquid nitrogen: Yes   Region frozen until ice ball extended beyond lesion: Yes   Outcome: patient tolerated procedure well with no complications   Post-procedure details: wound care instructions given    Inflamed seborrheic keratosis R forearm x 1  Prior to procedure, discussed risks of blister formation, small wound, skin dyspigmentation, or rare scar following cryotherapy. Recommend Vaseline ointment to treated areas while healing.   Destruction of lesion - R forearm x 1 Complexity: simple   Destruction method: cryotherapy   Informed consent: discussed and consent obtained   Timeout:  patient name, date of birth, surgical site, and procedure verified Lesion destroyed using liquid nitrogen: Yes   Region frozen until ice ball extended beyond lesion: Yes   Outcome: patient tolerated procedure well with no complications   Post-procedure details: wound  care instructions given    Lentigines - Scattered tan macules - Due to sun exposure - Benign-appearing, observe - Recommend daily broad spectrum sunscreen SPF 30+ to sun-exposed areas, reapply every 2 hours as needed. - Call for any  changes  Seborrheic Keratoses - Stuck-on, waxy, tan-brown papules and/or plaques  - Benign-appearing - Discussed benign etiology and prognosis. - Observe - Call for any changes  Melanocytic Nevi - Tan-brown and/or pink-flesh-colored symmetric macules and papules - Benign appearing on exam today - Observation - Call clinic for new or changing moles - Recommend daily use of broad spectrum spf 30+ sunscreen to sun-exposed areas.   Hemangiomas - Red papules - Discussed benign nature - Observe - Call for any changes  Actinic Damage - Chronic condition, secondary to cumulative UV/sun exposure - diffuse scaly erythematous macules with underlying dyspigmentation - Recommend daily broad spectrum sunscreen SPF 30+ to sun-exposed areas, reapply every 2 hours as needed.  - Staying in the shade or wearing long sleeves, sun glasses (UVA+UVB protection) and wide brim hats (4-inch brim around the entire circumference of the hat) are also recommended for sun protection.  - Call for new or changing lesions.  Skin cancer screening performed today.  Return in about 1 year (around 11/13/2022) for TBSE.  Luther Redo, CMA, am acting as scribe for Forest Gleason, MD .  Documentation: I have reviewed the above documentation for accuracy and completeness, and I agree with the above.  Forest Gleason, MD

## 2021-11-18 DIAGNOSIS — R0609 Other forms of dyspnea: Secondary | ICD-10-CM | POA: Diagnosis not present

## 2021-11-18 DIAGNOSIS — C9001 Multiple myeloma in remission: Secondary | ICD-10-CM | POA: Diagnosis not present

## 2021-11-18 DIAGNOSIS — C9000 Multiple myeloma not having achieved remission: Secondary | ICD-10-CM | POA: Diagnosis not present

## 2021-11-18 DIAGNOSIS — Z5112 Encounter for antineoplastic immunotherapy: Secondary | ICD-10-CM | POA: Diagnosis not present

## 2021-11-19 ENCOUNTER — Encounter: Payer: PPO | Admitting: Dermatology

## 2021-11-24 DIAGNOSIS — H353221 Exudative age-related macular degeneration, left eye, with active choroidal neovascularization: Secondary | ICD-10-CM | POA: Diagnosis not present

## 2021-11-25 ENCOUNTER — Encounter: Payer: Self-pay | Admitting: Dermatology

## 2021-12-01 ENCOUNTER — Ambulatory Visit: Payer: Self-pay | Admitting: Student-PharmD

## 2021-12-01 DIAGNOSIS — F411 Generalized anxiety disorder: Secondary | ICD-10-CM

## 2021-12-01 DIAGNOSIS — I10 Essential (primary) hypertension: Secondary | ICD-10-CM

## 2021-12-01 NOTE — Progress Notes (Signed)
General Review Call   ROSEALYNN, MATEUS [id number] 11 years, Female  DOB: [birth year]  M: [phone]   General Review  Completed by Charlann Lange on 12/01/2021  Chart Review What recent interventions/DTPs have been made by any provider to improve the patient's conditions in the last 3 months?:  11/13/21 DERM Moye, [place], MD. For annual exam. No medication changes.  11/18/21 Oncology Tuchman, Geronimo Boot, MD. Multiple myeloma in remission. No medication changes.  Any recent hospitalizations or ED visits since last visit with CPP?: No  Adherence Review  Adherence rates for STAR metric medications:  Lisinopril 5 mg 10/19/21 90 DS Atorvastatin 10 mg 10/19/21 90 DS Adherence rates for medications indicated for disease state being reviewed: Lisinopril 5 mg 10/19/21 90 DS Atorvastatin 10 mg 10/19/21 90 DS Does the patient have >5 day gap between last estimated fill dates for any of the above medications?: No  Disease State Questions Able to connect with the Patient?: Yes Did patient have any problems with their health recently?: No Did patient have any problems with their pharmacy?: No Does patient have any issues or side effects with their medications?: No Additional  information to pass to Patient's CPP?: No Anything we can do to help take better care of Patient?: No   Pharmacist Review Adherence gaps identified?: No Drug Therapy Problems identified?: No Assessment: Controlled  8 minutes spent in review, coordination, and documentation.  Reviewed by: Alena Bills, PharmD Clinical Pharmacist [phone]

## 2021-12-02 ENCOUNTER — Ambulatory Visit: Payer: PPO | Admitting: Nurse Practitioner

## 2021-12-12 ENCOUNTER — Other Ambulatory Visit: Payer: Self-pay

## 2021-12-12 ENCOUNTER — Encounter: Payer: Self-pay | Admitting: Nurse Practitioner

## 2021-12-12 ENCOUNTER — Ambulatory Visit (INDEPENDENT_AMBULATORY_CARE_PROVIDER_SITE_OTHER): Payer: PPO | Admitting: Nurse Practitioner

## 2021-12-12 VITALS — BP 140/80 | HR 70 | Temp 98.2°F | Resp 16 | Ht 64.0 in | Wt 137.0 lb

## 2021-12-12 DIAGNOSIS — C9001 Multiple myeloma in remission: Secondary | ICD-10-CM | POA: Diagnosis not present

## 2021-12-12 DIAGNOSIS — R300 Dysuria: Secondary | ICD-10-CM

## 2021-12-12 DIAGNOSIS — E559 Vitamin D deficiency, unspecified: Secondary | ICD-10-CM

## 2021-12-12 DIAGNOSIS — E782 Mixed hyperlipidemia: Secondary | ICD-10-CM | POA: Diagnosis not present

## 2021-12-12 DIAGNOSIS — Z0001 Encounter for general adult medical examination with abnormal findings: Secondary | ICD-10-CM | POA: Diagnosis not present

## 2021-12-12 DIAGNOSIS — I10 Essential (primary) hypertension: Secondary | ICD-10-CM | POA: Diagnosis not present

## 2021-12-12 DIAGNOSIS — E038 Other specified hypothyroidism: Secondary | ICD-10-CM | POA: Diagnosis not present

## 2021-12-12 DIAGNOSIS — F411 Generalized anxiety disorder: Secondary | ICD-10-CM

## 2021-12-12 MED ORDER — FLUTICASONE PROPIONATE 50 MCG/ACT NA SUSP
2.0000 | Freq: Every day | NASAL | 5 refills | Status: DC
Start: 2021-12-12 — End: 2022-12-16

## 2021-12-12 MED ORDER — LISINOPRIL 5 MG PO TABS: 5.0000 mg | ORAL_TABLET | Freq: Every day | ORAL | 1 refills | Status: DC

## 2021-12-12 MED ORDER — ATORVASTATIN CALCIUM 10 MG PO TABS: 10.0000 mg | ORAL_TABLET | Freq: Every day | ORAL | 1 refills | Status: DC

## 2021-12-12 NOTE — Progress Notes (Signed)
[facility] [place], [place] [address]  Internal MEDICINE  Office Visit Note  Patient Name: Sherry Oneal  [id number]  [id number]  Date of Service: 12/12/2021  Chief Complaint  Patient presents with   Medicare Wellness   Hyperlipidemia   Hypertension   Anemia   Medication Refill    HPI Paisley presents for an annual well visit and physical exam. She is a well appearing 75 yo female with chronic medical problems including hypertension and multiple myeloma. Her last mammogram was in April this year and it was normal. She is due for routine colonoscopy in 2024. She had a bone density scan in 2015. She is having vision problems due to macular degeneration in her left eye.  She is followed by oncology and still gets monthly chemotherapy.  She takes lisinopril 5 mg daily for hypertension and takes atorvastatin 10 mg daily for hyperlipidemia. She takes alprazolam at bedtime as needed for anxiety. She is due for routine labs. She denies any new or worsening pains and has no other concerns or questions.   Current Medication: Outpatient Encounter Medications as of 12/12/2021  Medication Sig   acetaminophen (TYLENOL) 500 MG tablet Take 650 mg by mouth in the morning and at bedtime.   ALPRAZolam (XANAX) 0.25 MG tablet Take 1 tablet (0.25 mg total) by mouth at bedtime as needed.   Calcium Carbonate-Vitamin D 600-400 MG-UNIT tablet Take 1 tablet by mouth daily.   Cranberry 500 MG CAPS Take 500 mg by mouth every morning.   DARATUMUMAB IV Inject into the vein every 30 (thirty) days.   dicyclomine (BENTYL) 10 MG capsule TAKE 1 CAPSULE BY MOUTH 3 TIMES DAILY AS NEEDED FOR SPASMS. TAKE WITH MEALS.   diphenhydrAMINE (BENADRYL) 25 MG tablet Take 25 mg by mouth daily as needed.   docusate (COLACE) 50 MG/5ML liquid Take by mouth daily as needed for mild constipation.   ELIQUIS 5 MG TABS tablet Take 1 tablet by mouth 2 (two) times daily.   ipratropium-albuterol (DUONEB) 0.5-2.5  (3) MG/3ML SOLN Take 3 mLs by nebulization every 6 (six) hours as needed.   meclizine (ANTIVERT) 25 MG tablet Take 25 mg by mouth as needed for dizziness.   Multiple Vitamins-Minerals (PRESERVISION AREDS PO) Take 1 tablet by mouth every morning.   omeprazole (PRILOSEC) 20 MG capsule Take 20 mg by mouth daily.   ondansetron (ZOFRAN-ODT) 4 MG disintegrating tablet DISSOLVE 1 TABLET IN MOUTH EVERY 8 HOURS AS NEEDED FOR NAUSEA   polyethylene glycol (MIRALAX / GLYCOLAX) packet Take 17 g by mouth daily as needed.   pomalidomide (POMALYST) 4 MG capsule Take 1 capsule by mouth once daily for days 1-21. 7 days rest.   prochlorperazine (COMPAZINE) 5 MG tablet Take 5 mg by mouth every 8 (eight) hours as needed.   promethazine (PHENERGAN) 25 MG tablet Take 1 tablet (25 mg total) by mouth every 6 (six) hours as needed for nausea or vomiting.   senna (SENOKOT) 8.6 MG tablet Take 1 tablet by mouth daily as needed for constipation.   valACYclovir (VALTREX) 500 MG tablet Take 500 mg by mouth daily.   vitamin B-12 (CYANOCOBALAMIN) 500 MCG tablet Take 500 mcg by mouth daily.   [DISCONTINUED] atorvastatin (LIPITOR) 10 MG tablet Take 1 tablet by mouth once daily   [DISCONTINUED] fluticasone (FLONASE) 50 MCG/ACT nasal spray Place into both nostrils daily.   [DISCONTINUED] lisinopril (ZESTRIL) 5 MG tablet Take 1 tablet by mouth once daily   atorvastatin (LIPITOR) 10 MG tablet  Take 1 tablet (10 mg total) by mouth daily.   fluticasone (FLONASE) 50 MCG/ACT nasal spray Place 2 sprays into both nostrils daily.   lisinopril (ZESTRIL) 5 MG tablet Take 1 tablet (5 mg total) by mouth daily.   No facility-administered encounter medications on file as of 12/12/2021.    Surgical History: Past Surgical History:  Procedure Laterality Date   CATARACT EXTRACTION W/PHACO Right 08/17/2018   Procedure: CATARACT EXTRACTION PHACO AND INTRAOCULAR LENS PLACEMENT ([place])  RIGHT;  Surgeon: Leandrew Koyanagi, MD;  Location: [facility];  Service: Ophthalmology;  Laterality: Right;   CATARACT EXTRACTION W/PHACO Left 11/09/2018   Procedure: CATARACT EXTRACTION PHACO AND INTRAOCULAR LENS PLACEMENT ([place]) LEFT;  Surgeon: Leandrew Koyanagi, MD;  Location: [facility];  Service: Ophthalmology;  Laterality: Left;   EXPLORATORY LAPAROTOMY      Medical History: Past Medical History:  Diagnosis Date   Anemia    Arthritis    lower back   High blood pressure    High cholesterol    History of actinic keratosis    Macular degeneration    recieving 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    Multiple myeloma ([facility])    Renal insufficiency    Wears dentures    partial upper    Family History: Family History  Problem Relation Age of Onset   Breast cancer Maternal Grandmother 67   CAD Mother    CVA Mother     Social History   Socioeconomic History   Marital status: Married    Spouse name: Not on file   Number of children: 1   Years of education: Not on file   Highest education level: Not on file  Occupational History   Not on file  Tobacco Use   Smoking status: Never   Smokeless tobacco: Never  Vaping Use   Vaping Use: Never used  Substance and Sexual Activity   Alcohol use: No   Drug use: No   Sexual activity: Not Currently    Birth control/protection: None  Other Topics Concern   Not on file  Social History Narrative   Lives at home with her husband, independent at baseline   Social Determinants of Health   Financial Resource Strain: Low Risk    Difficulty of Paying Living Expenses: Not very hard  Food Insecurity: Not on file  Transportation Needs: Not on file  Physical Activity: Not on file  Stress: Not on file  Social Connections: Not on file  Intimate Partner Violence: Not on file      Review of Systems  Constitutional:  Negative for activity change, appetite change, chills, fatigue, fever and unexpected weight change.  HENT: Negative.  Negative for  congestion, ear pain, rhinorrhea, sore throat and trouble swallowing.   Eyes: Negative.   Respiratory: Negative.  Negative for cough, chest tightness, shortness of breath and wheezing.   Cardiovascular: Negative.  Negative for chest pain.  Gastrointestinal: Negative.  Negative for abdominal pain, blood in stool, constipation, diarrhea, nausea and vomiting.  Endocrine: Negative.   Genitourinary: Negative.  Negative for difficulty urinating, dysuria, frequency, hematuria and urgency.  Musculoskeletal: Negative.  Negative for arthralgias, back pain, joint swelling, myalgias and neck pain.  Skin: Negative.  Negative for rash and wound.  Allergic/Immunologic: Negative.  Negative for immunocompromised state.  Neurological: Negative.  Negative for dizziness, seizures, numbness and headaches.  Hematological: Negative.   Psychiatric/Behavioral: Negative.  Negative for behavioral problems, self-injury and suicidal ideas. The patient is not  nervous/anxious.    Vital Signs: BP 140/80 Comment: 166/72   Pulse 70    Temp 98.2 F (36.8 C)    Resp 16    Ht _0  (1.626 m)    Wt 137 lb (62.1 kg)    SpO2 98%    BMI 23.52 kg/m    Physical Exam Vitals reviewed.  Constitutional:      General: She is awake. She is not in acute distress.    Appearance: Normal appearance. She is well-developed and well-groomed. She is obese. She is not ill-appearing or diaphoretic.  HENT:     Head: Normocephalic and atraumatic.     Right Ear: Tympanic membrane, ear canal and external ear normal.     Left Ear: Tympanic membrane, ear canal and external ear normal.     Nose: Nose normal. No congestion or rhinorrhea.     Mouth/Throat:     Lips: Pink.     Mouth: Mucous membranes are moist.     Pharynx: Oropharynx is clear. Uvula midline. No oropharyngeal exudate or posterior oropharyngeal erythema.  Eyes:     General: Lids are normal. Vision grossly intact. Gaze aligned appropriately. No scleral icterus.       Right eye: No  discharge.        Left eye: No discharge.     Extraocular Movements: Extraocular movements intact.     Conjunctiva/sclera: Conjunctivae normal.     Pupils: Pupils are equal, round, and reactive to light.     Funduscopic exam:    Right eye: Red reflex present.        Left eye: Red reflex present. Neck:     Thyroid: No thyromegaly.     Vascular: No JVD.     Trachea: Trachea and phonation normal. No tracheal deviation.  Cardiovascular:     Rate and Rhythm: Normal rate and regular rhythm.     Pulses: Normal pulses.     Heart sounds: Normal heart sounds, S1 normal and S2 normal. No murmur heard.   No friction rub. No gallop.  Pulmonary:     Effort: Pulmonary effort is normal. No accessory muscle usage or respiratory distress.     Breath sounds: Normal breath sounds and air entry. No stridor. No wheezing or rales.  Chest:     Chest wall: No tenderness.     Comments: Declined clinical breast exam Abdominal:     General: Bowel sounds are normal. There is no distension.     Palpations: Abdomen is soft. There is no shifting dullness, fluid wave, mass or pulsatile mass.     Tenderness: There is no abdominal tenderness. There is no guarding or rebound.  Musculoskeletal:        General: No tenderness or deformity. Normal range of motion.     Cervical back: Normal range of motion and neck supple.     Right lower leg: No edema.     Left lower leg: No edema.  Lymphadenopathy:     Cervical: No cervical adenopathy.  Skin:    General: Skin is warm and dry.     Capillary Refill: Capillary refill takes less than 2 seconds.     Coloration: Skin is not pale.     Findings: No erythema or rash.  Neurological:     Mental Status: She is alert and oriented to person, place, and time.     Cranial Nerves: No cranial nerve deficit.     Motor: No abnormal muscle tone.     Coordination: Coordination  normal.     Gait: Gait normal.     Deep Tendon Reflexes: Reflexes are normal and symmetric.  Psychiatric:         Mood and Affect: Mood normal.        Behavior: Behavior normal. Behavior is cooperative.        Thought Content: Thought content normal.        Judgment: Judgment normal.       Assessment/Plan: 1. Encounter for general adult medical examination with abnormal findings Age-appropriate preventive screenings and vaccinations discussed, annual physical exam completed. Routine labs for health maintenance ordered, see below. PHM updated.   2. Essential hypertension Refills ordered, BP currently stable.  - lisinopril (ZESTRIL) 5 MG tablet; Take 1 tablet (5 mg total) by mouth daily.  Dispense: 90 tablet; Refill: 1  3. Multiple myeloma in remission ([place]) Followed and managed by oncology  4. Mixed hyperlipidemia Atorvastatin refills ordered, routine lab ordered - atorvastatin (LIPITOR) 10 MG tablet; Take 1 tablet (10 mg total) by mouth daily.  Dispense: 90 tablet; Refill: 1 - Lipid Profile  5. Other specified hypothyroidism Routine lab ordered - TSH + free T4  6. Vitamin D deficiency Routine lab ordered - Vitamin D (25 hydroxy)  7. Dysuria Routine urinalysis done - UA/M w/rflx Culture, Routine - Microscopic Examination - Urine Culture, Reflex  8. Generalized anxiety disorder Taking alprazolam prn      General Counseling: Sandy Salaam understanding of the findings of todays visit and agrees with plan of treatment. I have discussed any further diagnostic evaluation that may be needed or ordered today. We also reviewed her medications today. she has been encouraged to call the office with any questions or concerns that should arise related to todays visit.    Orders Placed This Encounter  Procedures   Microscopic Examination   Urine Culture, Reflex   UA/M w/rflx Culture, Routine   Lipid Profile   Vitamin D (25 hydroxy)   TSH + free T4    Meds ordered this encounter  Medications   fluticasone (FLONASE) 50 MCG/ACT nasal spray    Sig: Place 2 sprays into both  nostrils daily.    Dispense:  16 g    Refill:  5   lisinopril (ZESTRIL) 5 MG tablet    Sig: Take 1 tablet (5 mg total) by mouth daily.    Dispense:  90 tablet    Refill:  1   atorvastatin (LIPITOR) 10 MG tablet    Sig: Take 1 tablet (10 mg total) by mouth daily.    Dispense:  90 tablet    Refill:  1    Return in about 6 months (around 06/12/2022) for F/U, med refill, Kyrstin Campillo PCP.   Total time spent:30 Minutes Time spent includes review of chart, medications, test results, and follow up plan with the patient.   [place] Controlled Substance Database was reviewed by me.  This patient was seen by [name], FNP-C in collaboration with Dr. Clayborn Bigness as a part of collaborative care agreement.  Artis Buechele R. Valetta Fuller, MSN, FNP-C Internal medicine

## 2021-12-16 DIAGNOSIS — R0609 Other forms of dyspnea: Secondary | ICD-10-CM | POA: Diagnosis not present

## 2021-12-16 DIAGNOSIS — I10 Essential (primary) hypertension: Secondary | ICD-10-CM | POA: Diagnosis not present

## 2021-12-16 DIAGNOSIS — E785 Hyperlipidemia, unspecified: Secondary | ICD-10-CM | POA: Diagnosis not present

## 2021-12-16 DIAGNOSIS — R110 Nausea: Secondary | ICD-10-CM | POA: Diagnosis not present

## 2021-12-16 DIAGNOSIS — C9001 Multiple myeloma in remission: Secondary | ICD-10-CM | POA: Diagnosis not present

## 2021-12-16 DIAGNOSIS — E538 Deficiency of other specified B group vitamins: Secondary | ICD-10-CM | POA: Diagnosis not present

## 2021-12-16 DIAGNOSIS — C9000 Multiple myeloma not having achieved remission: Secondary | ICD-10-CM | POA: Diagnosis not present

## 2021-12-16 DIAGNOSIS — C9002 Multiple myeloma in relapse: Secondary | ICD-10-CM | POA: Diagnosis not present

## 2021-12-16 DIAGNOSIS — E041 Nontoxic single thyroid nodule: Secondary | ICD-10-CM | POA: Diagnosis not present

## 2021-12-16 DIAGNOSIS — Z888 Allergy status to other drugs, medicaments and biological substances status: Secondary | ICD-10-CM | POA: Diagnosis not present

## 2021-12-16 DIAGNOSIS — Z882 Allergy status to sulfonamides status: Secondary | ICD-10-CM | POA: Diagnosis not present

## 2021-12-16 DIAGNOSIS — Z886 Allergy status to analgesic agent status: Secondary | ICD-10-CM | POA: Diagnosis not present

## 2021-12-16 DIAGNOSIS — Z79899 Other long term (current) drug therapy: Secondary | ICD-10-CM | POA: Diagnosis not present

## 2021-12-16 DIAGNOSIS — Z885 Allergy status to narcotic agent status: Secondary | ICD-10-CM | POA: Diagnosis not present

## 2021-12-16 DIAGNOSIS — Z880 Allergy status to penicillin: Secondary | ICD-10-CM | POA: Diagnosis not present

## 2021-12-16 DIAGNOSIS — Z881 Allergy status to other antibiotic agents status: Secondary | ICD-10-CM | POA: Diagnosis not present

## 2021-12-16 DIAGNOSIS — D801 Nonfamilial hypogammaglobulinemia: Secondary | ICD-10-CM | POA: Diagnosis not present

## 2021-12-16 DIAGNOSIS — F32A Depression, unspecified: Secondary | ICD-10-CM | POA: Diagnosis not present

## 2021-12-16 DIAGNOSIS — D61810 Antineoplastic chemotherapy induced pancytopenia: Secondary | ICD-10-CM | POA: Diagnosis not present

## 2021-12-16 DIAGNOSIS — T441X5A Adverse effect of other parasympathomimetics [cholinergics], initial encounter: Secondary | ICD-10-CM | POA: Diagnosis not present

## 2021-12-16 DIAGNOSIS — H3530 Unspecified macular degeneration: Secondary | ICD-10-CM | POA: Diagnosis not present

## 2021-12-16 DIAGNOSIS — D701 Agranulocytosis secondary to cancer chemotherapy: Secondary | ICD-10-CM | POA: Diagnosis not present

## 2021-12-16 DIAGNOSIS — F419 Anxiety disorder, unspecified: Secondary | ICD-10-CM | POA: Diagnosis not present

## 2021-12-17 ENCOUNTER — Ambulatory Visit: Payer: PPO | Admitting: Nurse Practitioner

## 2021-12-17 LAB — UA/M W/RFLX CULTURE, ROUTINE
Bilirubin, UA: NEGATIVE
Glucose, UA: NEGATIVE
Ketones, UA: NEGATIVE
Nitrite, UA: NEGATIVE
Protein,UA: NEGATIVE
Specific Gravity, UA: 1.023 (ref 1.005–1.030)
Urobilinogen, Ur: 0.2 mg/dL (ref 0.2–1.0)
pH, UA: 6 (ref 5.0–7.5)

## 2021-12-17 LAB — MICROSCOPIC EXAMINATION
Bacteria, UA: NONE SEEN
Casts: NONE SEEN /lpf

## 2021-12-17 LAB — URINE CULTURE, REFLEX

## 2021-12-23 DIAGNOSIS — H353221 Exudative age-related macular degeneration, left eye, with active choroidal neovascularization: Secondary | ICD-10-CM | POA: Diagnosis not present

## 2021-12-25 DIAGNOSIS — E038 Other specified hypothyroidism: Secondary | ICD-10-CM | POA: Diagnosis not present

## 2021-12-25 DIAGNOSIS — E782 Mixed hyperlipidemia: Secondary | ICD-10-CM | POA: Diagnosis not present

## 2021-12-25 DIAGNOSIS — E559 Vitamin D deficiency, unspecified: Secondary | ICD-10-CM | POA: Diagnosis not present

## 2021-12-26 DIAGNOSIS — E782 Mixed hyperlipidemia: Secondary | ICD-10-CM | POA: Diagnosis not present

## 2021-12-26 DIAGNOSIS — I10 Essential (primary) hypertension: Secondary | ICD-10-CM | POA: Diagnosis not present

## 2021-12-26 LAB — TSH+FREE T4
Free T4: 1.39 ng/dL (ref 0.82–1.77)
TSH: 1.5 u[IU]/mL (ref 0.450–4.500)

## 2021-12-26 LAB — LIPID PANEL
Chol/HDL Ratio: 4 ratio (ref 0.0–4.4)
Cholesterol, Total: 190 mg/dL (ref 100–199)
HDL: 47 mg/dL (ref >39)
LDL Chol Calc (NIH): 110 mg/dL — ABNORMAL HIGH (ref 0–99)
Triglycerides: 187 mg/dL — ABNORMAL HIGH (ref 0–149)
VLDL Cholesterol Cal: 33 mg/dL (ref 5–40)

## 2021-12-26 LAB — VITAMIN D 25 HYDROXY (VIT D DEFICIENCY, FRACTURES): Vit D, 25-Hydroxy: 41.6 ng/mL (ref 30.0–100.0)

## 2021-12-28 ENCOUNTER — Encounter: Payer: Self-pay | Admitting: Nurse Practitioner

## 2022-01-01 ENCOUNTER — Telehealth: Payer: Self-pay

## 2022-01-01 NOTE — Telephone Encounter (Signed)
-----   Message from [name], NP sent at 01/01/2022  1:41 PM EST ----- Please call patient and let her know that her vitamin D level and thyroid levels are normal. Her lipid panel is abnormal. LDL is slightly elevated at 110 and triglycerides are 187.  Continue taking atorvastatin as prescribed.

## 2022-01-01 NOTE — Progress Notes (Signed)
Please call patient and let her know that her vitamin D level and thyroid levels are normal. Her lipid panel is abnormal. LDL is slightly elevated at 110 and triglycerides are 187.  Continue taking atorvastatin as prescribed.

## 2022-01-13 DIAGNOSIS — F418 Other specified anxiety disorders: Secondary | ICD-10-CM | POA: Diagnosis not present

## 2022-01-13 DIAGNOSIS — E041 Nontoxic single thyroid nodule: Secondary | ICD-10-CM | POA: Diagnosis not present

## 2022-01-13 DIAGNOSIS — Z880 Allergy status to penicillin: Secondary | ICD-10-CM | POA: Diagnosis not present

## 2022-01-13 DIAGNOSIS — Z885 Allergy status to narcotic agent status: Secondary | ICD-10-CM | POA: Diagnosis not present

## 2022-01-13 DIAGNOSIS — G4700 Insomnia, unspecified: Secondary | ICD-10-CM | POA: Diagnosis not present

## 2022-01-13 DIAGNOSIS — Z886 Allergy status to analgesic agent status: Secondary | ICD-10-CM | POA: Diagnosis not present

## 2022-01-13 DIAGNOSIS — R110 Nausea: Secondary | ICD-10-CM | POA: Diagnosis not present

## 2022-01-13 DIAGNOSIS — Z5112 Encounter for antineoplastic immunotherapy: Secondary | ICD-10-CM | POA: Diagnosis not present

## 2022-01-13 DIAGNOSIS — E538 Deficiency of other specified B group vitamins: Secondary | ICD-10-CM | POA: Diagnosis not present

## 2022-01-13 DIAGNOSIS — H3530 Unspecified macular degeneration: Secondary | ICD-10-CM | POA: Diagnosis not present

## 2022-01-13 DIAGNOSIS — Z882 Allergy status to sulfonamides status: Secondary | ICD-10-CM | POA: Diagnosis not present

## 2022-01-13 DIAGNOSIS — D709 Neutropenia, unspecified: Secondary | ICD-10-CM | POA: Diagnosis not present

## 2022-01-13 DIAGNOSIS — R530 Neoplastic (malignant) related fatigue: Secondary | ICD-10-CM | POA: Diagnosis not present

## 2022-01-13 DIAGNOSIS — C9000 Multiple myeloma not having achieved remission: Secondary | ICD-10-CM | POA: Diagnosis not present

## 2022-01-13 DIAGNOSIS — D801 Nonfamilial hypogammaglobulinemia: Secondary | ICD-10-CM | POA: Diagnosis not present

## 2022-01-13 DIAGNOSIS — L989 Disorder of the skin and subcutaneous tissue, unspecified: Secondary | ICD-10-CM | POA: Diagnosis not present

## 2022-01-13 DIAGNOSIS — R5383 Other fatigue: Secondary | ICD-10-CM | POA: Diagnosis not present

## 2022-01-13 DIAGNOSIS — C9002 Multiple myeloma in relapse: Secondary | ICD-10-CM | POA: Diagnosis not present

## 2022-01-13 DIAGNOSIS — R0609 Other forms of dyspnea: Secondary | ICD-10-CM | POA: Diagnosis not present

## 2022-01-20 DIAGNOSIS — H353221 Exudative age-related macular degeneration, left eye, with active choroidal neovascularization: Secondary | ICD-10-CM | POA: Diagnosis not present

## 2022-02-10 DIAGNOSIS — F411 Generalized anxiety disorder: Secondary | ICD-10-CM | POA: Diagnosis not present

## 2022-02-10 DIAGNOSIS — D801 Nonfamilial hypogammaglobulinemia: Secondary | ICD-10-CM | POA: Diagnosis not present

## 2022-02-10 DIAGNOSIS — R0609 Other forms of dyspnea: Secondary | ICD-10-CM | POA: Diagnosis not present

## 2022-02-10 DIAGNOSIS — Z5112 Encounter for antineoplastic immunotherapy: Secondary | ICD-10-CM | POA: Diagnosis not present

## 2022-02-10 DIAGNOSIS — C9001 Multiple myeloma in remission: Secondary | ICD-10-CM | POA: Diagnosis not present

## 2022-02-10 DIAGNOSIS — D61810 Antineoplastic chemotherapy induced pancytopenia: Secondary | ICD-10-CM | POA: Diagnosis not present

## 2022-02-10 DIAGNOSIS — C9000 Multiple myeloma not having achieved remission: Secondary | ICD-10-CM | POA: Diagnosis not present

## 2022-03-02 DIAGNOSIS — H353221 Exudative age-related macular degeneration, left eye, with active choroidal neovascularization: Secondary | ICD-10-CM | POA: Diagnosis not present

## 2022-03-10 DIAGNOSIS — Z5112 Encounter for antineoplastic immunotherapy: Secondary | ICD-10-CM | POA: Diagnosis not present

## 2022-03-10 DIAGNOSIS — R0609 Other forms of dyspnea: Secondary | ICD-10-CM | POA: Diagnosis not present

## 2022-03-10 DIAGNOSIS — C9001 Multiple myeloma in remission: Secondary | ICD-10-CM | POA: Diagnosis not present

## 2022-03-10 DIAGNOSIS — C9000 Multiple myeloma not having achieved remission: Secondary | ICD-10-CM | POA: Diagnosis not present

## 2022-03-12 DIAGNOSIS — H40003 Preglaucoma, unspecified, bilateral: Secondary | ICD-10-CM | POA: Diagnosis not present

## 2022-03-16 ENCOUNTER — Ambulatory Visit (INDEPENDENT_AMBULATORY_CARE_PROVIDER_SITE_OTHER): Payer: PPO | Admitting: Nurse Practitioner

## 2022-03-16 ENCOUNTER — Other Ambulatory Visit: Payer: Self-pay

## 2022-03-16 ENCOUNTER — Encounter: Payer: Self-pay | Admitting: Nurse Practitioner

## 2022-03-16 VITALS — BP 140/80 | HR 67 | Temp 98.6°F | Resp 16 | Ht 64.5 in | Wt 137.4 lb

## 2022-03-16 DIAGNOSIS — F411 Generalized anxiety disorder: Secondary | ICD-10-CM | POA: Diagnosis not present

## 2022-03-16 DIAGNOSIS — C9001 Multiple myeloma in remission: Secondary | ICD-10-CM

## 2022-03-16 DIAGNOSIS — I10 Essential (primary) hypertension: Secondary | ICD-10-CM

## 2022-03-16 DIAGNOSIS — M25511 Pain in right shoulder: Secondary | ICD-10-CM | POA: Diagnosis not present

## 2022-03-16 DIAGNOSIS — M7551 Bursitis of right shoulder: Secondary | ICD-10-CM

## 2022-03-16 MED ORDER — PREDNISONE 10 MG PO TABS
ORAL_TABLET | ORAL | 0 refills | Status: DC
Start: 2022-03-16 — End: 2022-12-14

## 2022-03-16 MED ORDER — LISINOPRIL 10 MG PO TABS: 10.0000 mg | ORAL_TABLET | Freq: Every day | ORAL | 2 refills | Status: DC

## 2022-03-16 NOTE — Progress Notes (Signed)
[facility] ?[address] ?[place], [place] [address] ? ?Internal MEDICINE  ?Office Visit Note ? ?Patient Name: Sherry Oneal ? [id number]  ?[id number] ? ?Date of Service: 03/16/2022 ? ?Chief Complaint  ?Patient presents with  ? Acute Visit  ? Shoulder Pain  ?  No known injury, right shoulder, started wednesday  ? ? ? ?HPI ?Sherry Oneal presents for an acute sick visit for right shoulder pain. Has had frozen shoulder on the left side previously. Sherry Oneal reports that it hurts to lift her right arm. Pain stops at elbow. Denis any pain in right forearm, wrist or hand. Sherry Oneal reports that Sherry Oneal sleeps on the right side sometimes. Patiant has had no known injury to the right shoulder. Pain has been going on for a couple of months.  ?Blood pressure has been elevated at home. Wants to increase lisinopril dose if necessary.  ? ? ?Current Medication: ? ?Outpatient Encounter Medications as of 03/16/2022  ?Medication Sig  ? acetaminophen (TYLENOL) 500 MG tablet Take 650 mg by mouth in the morning and at bedtime.  ? ALPRAZolam (XANAX) 0.25 MG tablet Take 1 tablet (0.25 mg total) by mouth at bedtime as needed.  ? atorvastatin (LIPITOR) 10 MG tablet Take 1 tablet (10 mg total) by mouth daily.  ? Calcium Carbonate-Vitamin D 600-400 MG-UNIT tablet Take 1 tablet by mouth daily.  ? Cranberry 500 MG CAPS Take 500 mg by mouth every morning.  ? DARATUMUMAB IV Inject into the vein every 30 (thirty) days.  ? dicyclomine (BENTYL) 10 MG capsule TAKE 1 CAPSULE BY MOUTH 3 TIMES DAILY AS NEEDED FOR SPASMS. TAKE WITH MEALS.  ? diphenhydrAMINE (BENADRYL) 25 MG tablet Take 25 mg by mouth daily as needed.  ? docusate (COLACE) 50 MG/5ML liquid Take by mouth daily as needed for mild constipation.  ? ELIQUIS 5 MG TABS tablet Take 1 tablet by mouth 2 (two) times daily.  ? fluticasone (FLONASE) 50 MCG/ACT nasal spray Place 2 sprays into both nostrils daily.  ? ipratropium-albuterol (DUONEB) 0.5-2.5 (3) MG/3ML SOLN Take 3 mLs by nebulization every 6 (six) hours  as needed.  ? lisinopril (ZESTRIL) 10 MG tablet Take 1 tablet (10 mg total) by mouth daily.  ? meclizine (ANTIVERT) 25 MG tablet Take 25 mg by mouth as needed for dizziness.  ? Multiple Vitamins-Minerals (PRESERVISION AREDS PO) Take 1 tablet by mouth every morning.  ? omeprazole (PRILOSEC) 20 MG capsule Take 20 mg by mouth daily.  ? ondansetron (ZOFRAN-ODT) 4 MG disintegrating tablet DISSOLVE 1 TABLET IN MOUTH EVERY 8 HOURS AS NEEDED FOR NAUSEA  ? polyethylene glycol (MIRALAX / GLYCOLAX) packet Take 17 g by mouth daily as needed.  ? pomalidomide (POMALYST) 4 MG capsule Take 1 capsule by mouth once daily for days 1-21. 7 days rest.  ? predniSONE (DELTASONE) 10 MG tablet Take one tab 3 x day for 3 days, then take one tab 2 x a day for 3 days and then take one tab a day for 3 days for copd  ? prochlorperazine (COMPAZINE) 5 MG tablet Take 5 mg by mouth every 8 (eight) hours as needed.  ? promethazine (PHENERGAN) 25 MG tablet Take 1 tablet (25 mg total) by mouth every 6 (six) hours as needed for nausea or vomiting.  ? senna (SENOKOT) 8.6 MG tablet Take 1 tablet by mouth daily as needed for constipation.  ? valACYclovir (VALTREX) 500 MG tablet Take 500 mg by mouth daily.  ? vitamin B-12 (CYANOCOBALAMIN) 500 MCG tablet Take 500 mcg by  mouth daily.  ? [DISCONTINUED] lisinopril (ZESTRIL) 5 MG tablet Take 1 tablet (5 mg total) by mouth daily.  ? ?No facility-administered encounter medications on file as of 03/16/2022.  ? ? ? ? ?Medical History: ?Past Medical History:  ?Diagnosis Date  ? Anemia   ? Arthritis   ? lower back  ? High blood pressure   ? High cholesterol   ? History of actinic keratosis   ? Macular degeneration   ? recieving 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   ? Multiple myeloma ([place])   ? Renal insufficiency   ? Wears dentures   ? partial upper  ? ? ? ?Vital Signs: ?BP 140/80 Comment: 150/78  Pulse 67   Temp 98.6 ?F (37 ?C)   Resp 16   Ht 5' 4.5" (1.638 m)   Wt 137 lb 6.4 oz (62.3  kg)   SpO2 99%   BMI 23.22 kg/m?  ? ? ?Review of Systems  ?Constitutional:  Negative for chills, fatigue and unexpected weight change.  ?HENT:  Negative for congestion, rhinorrhea, sneezing and sore throat.   ?Eyes:  Negative for redness.  ?Respiratory: Negative.  Negative for cough, chest tightness, shortness of breath and wheezing.   ?Cardiovascular: Negative.  Negative for chest pain and palpitations.  ?Gastrointestinal:  Negative for abdominal pain, constipation, diarrhea, nausea and vomiting.  ?Genitourinary:  Negative for dysuria and frequency.  ?Musculoskeletal:  Positive for arthralgias, back pain and neck pain. Negative for joint swelling.  ?Skin:  Negative for rash.  ?Neurological: Negative.  Negative for tremors and numbness.  ?Hematological:  Negative for adenopathy. Does not bruise/bleed easily.  ?Psychiatric/Behavioral:  Negative for behavioral problems (Depression), self-injury, sleep disturbance and suicidal ideas. The patient is not nervous/anxious.   ? ?Physical Exam ?Vitals reviewed.  ?Constitutional:   ?   General: Sherry Oneal is not in acute distress. ?   Appearance: Normal appearance. Sherry Oneal is normal weight. Sherry Oneal is not ill-appearing.  ?HENT:  ?   Head: Normocephalic and atraumatic.  ?Eyes:  ?   Pupils: Pupils are equal, round, and reactive to light.  ?Cardiovascular:  ?   Rate and Rhythm: Normal rate and regular rhythm.  ?Pulmonary:  ?   Effort: Pulmonary effort is normal. No respiratory distress.  ?Neurological:  ?   Mental Status: Sherry Oneal is alert and oriented to person, place, and time.  ?Psychiatric:     ?   Mood and Affect: Mood normal.     ?   Behavior: Behavior normal.  ? ? ? ? ?Assessment/Plan: ?1. Acute pain of right shoulder ?Prednisone taper prescribed to decrease inflammation and swelling.  ?- predniSONE (DELTASONE) 10 MG tablet; Take one tab 3 x day for 3 days, then take one tab 2 x a day for 3 days and then take one tab a day for 3 days for copd  Dispense: 18 tablet; Refill: 0 ? ?2. Essential  hypertension ?Lisinopril dose increased to 10 mg daily.  ?- lisinopril (ZESTRIL) 10 MG tablet; Take 1 tablet (10 mg total) by mouth daily.  Dispense: 30 tablet; Refill: 2 ? ?3. Bursitis of right shoulder ?See problem #1 ?- predniSONE (DELTASONE) 10 MG tablet; Take one tab 3 x day for 3 days, then take one tab 2 x a day for 3 days and then take one tab a day for 3 days for copd  Dispense: 18 tablet; Refill: 0 ? ?4. Multiple myeloma in remission ([place]) ?Followed by oncology ? ?5. Generalized anxiety disorder ?Stable, takes  alprazolam as needed.  ? ? ?General Counseling: Sherry Oneal understanding of the findings of todays visit and agrees with plan of treatment. I have discussed any further diagnostic evaluation that may be needed or ordered today. We also reviewed her medications today. Sherry Oneal has been encouraged to call the office with any questions or concerns that should arise related to todays visit. ? ? ? ?Counseling: ? ? ? ?No orders of the defined types were placed in this encounter. ? ? ?Meds ordered this encounter  ?Medications  ? predniSONE (DELTASONE) 10 MG tablet  ?  Sig: Take one tab 3 x day for 3 days, then take one tab 2 x a day for 3 days and then take one tab a day for 3 days for copd  ?  Dispense:  18 tablet  ?  Refill:  0  ? lisinopril (ZESTRIL) 10 MG tablet  ?  Sig: Take 1 tablet (10 mg total) by mouth daily.  ?  Dispense:  30 tablet  ?  Refill:  2  ? ? ?Return in about 1 month (around 04/16/2022) for F/U, BP check, Taria Castrillo PCP. ? ?[place] Controlled Substance Database was reviewed by me for overdose risk score (ORS) ? ?Time spent:30 Minutes ?Time spent with patient included reviewing progress notes, labs, imaging studies, and discussing plan for follow up.  ? ?This patient was seen by [name], FNP-C in collaboration with Dr. Clayborn Bigness as a part of collaborative care agreement. ? ?Laelah Siravo R. Valetta Fuller, MSN, FNP-C ?Internal Medicine ?

## 2022-03-17 ENCOUNTER — Ambulatory Visit: Payer: PPO | Admitting: Nurse Practitioner

## 2022-03-23 ENCOUNTER — Telehealth (INDEPENDENT_AMBULATORY_CARE_PROVIDER_SITE_OTHER): Payer: PPO | Admitting: Internal Medicine

## 2022-03-23 ENCOUNTER — Encounter: Payer: Self-pay | Admitting: Internal Medicine

## 2022-03-23 VITALS — Temp 98.2°F | Ht 64.5 in | Wt 137.0 lb

## 2022-03-23 DIAGNOSIS — J45909 Unspecified asthma, uncomplicated: Secondary | ICD-10-CM

## 2022-03-23 DIAGNOSIS — C9000 Multiple myeloma not having achieved remission: Secondary | ICD-10-CM | POA: Diagnosis not present

## 2022-03-23 MED ORDER — LEVOFLOXACIN 500 MG PO TABS: 500.0000 mg | ORAL_TABLET | Freq: Every day | ORAL | 0 refills | Status: DC

## 2022-03-23 NOTE — Progress Notes (Signed)
[facility] ?[address] ?[place], [place] [address] ? ?Internal MEDICINE  ?Telephone Visit ? ?Patient Name: Sherry Oneal ? [id number]  ?[id number] ? ?Date of Service: 03/30/2022 ? ?I connected with the patient at 1007 am by telephone and verified the patients identity using two identifiers.   ?I discussed the limitations, risks, security and privacy concerns of performing an evaluation and management service by telephone and the availability of in person appointments. I also discussed with the patient that there may be a patient responsible charge related to the service.  The patient expressed understanding and agrees to proceed.   ? ?Chief Complaint  ?Patient presents with  ? Telephone Assessment  ?  Going on few days   ? Telephone Screen  ?  [id number]  ? Sore Throat  ? Cough  ? Sinusitis  ? ? ?HPI ? ?Patient is connected we are questioning visit for ongoing symptoms of sore throat cough and sinus congestion, patient has been wheezing as well. ?She has been taking prednisone for shoulder inflammation/bursitis ?Patient is also high risk for due to multiple myeloma and has been under active treatment ?She denies any fever and chills at this point ? ?Current Medication: ?Outpatient Encounter Medications as of 03/23/2022  ?Medication Sig  ? acetaminophen (TYLENOL) 500 MG tablet Take 650 mg by mouth in the morning and at bedtime.  ? ALPRAZolam (XANAX) 0.25 MG tablet Take 1 tablet (0.25 mg total) by mouth at bedtime as needed.  ? atorvastatin (LIPITOR) 10 MG tablet Take 1 tablet (10 mg total) by mouth daily.  ? Calcium Carbonate-Vitamin D 600-400 MG-UNIT tablet Take 1 tablet by mouth daily.  ? Cranberry 500 MG CAPS Take 500 mg by mouth every morning.  ? DARATUMUMAB IV Inject into the vein every 30 (thirty) days.  ? dicyclomine (BENTYL) 10 MG capsule TAKE 1 CAPSULE BY MOUTH 3 TIMES DAILY AS NEEDED FOR SPASMS. TAKE WITH MEALS.  ? diphenhydrAMINE (BENADRYL) 25 MG tablet Take 25 mg by mouth daily as needed.  ?  docusate (COLACE) 50 MG/5ML liquid Take by mouth daily as needed for mild constipation.  ? ELIQUIS 5 MG TABS tablet Take 1 tablet by mouth 2 (two) times daily.  ? fluticasone (FLONASE) 50 MCG/ACT nasal spray Place 2 sprays into both nostrils daily.  ? ipratropium-albuterol (DUONEB) 0.5-2.5 (3) MG/3ML SOLN Take 3 mLs by nebulization every 6 (six) hours as needed.  ? levofloxacin (LEVAQUIN) 500 MG tablet Take 1 tablet (500 mg total) by mouth daily.  ? lisinopril (ZESTRIL) 10 MG tablet Take 1 tablet (10 mg total) by mouth daily.  ? meclizine (ANTIVERT) 25 MG tablet Take 25 mg by mouth as needed for dizziness.  ? Multiple Vitamins-Minerals (PRESERVISION AREDS PO) Take 1 tablet by mouth every morning.  ? omeprazole (PRILOSEC) 20 MG capsule Take 20 mg by mouth daily.  ? ondansetron (ZOFRAN-ODT) 4 MG disintegrating tablet DISSOLVE 1 TABLET IN MOUTH EVERY 8 HOURS AS NEEDED FOR NAUSEA  ? polyethylene glycol (MIRALAX / GLYCOLAX) packet Take 17 g by mouth daily as needed.  ? pomalidomide (POMALYST) 4 MG capsule Take 1 capsule by mouth once daily for days 1-21. 7 days rest.  ? predniSONE (DELTASONE) 10 MG tablet Take one tab 3 x day for 3 days, then take one tab 2 x a day for 3 days and then take one tab a day for 3 days for copd  ? prochlorperazine (COMPAZINE) 5 MG tablet Take 5 mg by mouth every 8 (eight) hours as needed.  ?  promethazine (PHENERGAN) 25 MG tablet Take 1 tablet (25 mg total) by mouth every 6 (six) hours as needed for nausea or vomiting.  ? senna (SENOKOT) 8.6 MG tablet Take 1 tablet by mouth daily as needed for constipation.  ? valACYclovir (VALTREX) 500 MG tablet Take 500 mg by mouth daily.  ? vitamin B-12 (CYANOCOBALAMIN) 500 MCG tablet Take 500 mcg by mouth daily.  ? ?No facility-administered encounter medications on file as of 03/23/2022.  ? ? ?Surgical History: ?Past Surgical History:  ?Procedure Laterality Date  ? CATARACT EXTRACTION W/PHACO Right 08/17/2018  ? Procedure: CATARACT EXTRACTION PHACO AND  INTRAOCULAR LENS PLACEMENT ([place] [facility])  RIGHT;  Surgeon: Leandrew Koyanagi, MD;  Location: [facility];  Service: Ophthalmology;  Laterality: Right;  ? CATARACT EXTRACTION W/PHACO Left 11/09/2018  ? Procedure: CATARACT EXTRACTION PHACO AND INTRAOCULAR LENS PLACEMENT ([facility]) LEFT;  Surgeon: Leandrew Koyanagi, MD;  Location: [facility];  Service: Ophthalmology;  Laterality: Left;  ? EXPLORATORY LAPAROTOMY    ? ? ?Medical History: ?Past Medical History:  ?Diagnosis Date  ? Anemia   ? Arthritis   ? lower back  ? High blood pressure   ? High cholesterol   ? History of actinic keratosis   ? Macular degeneration   ? recieving 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   ? Multiple myeloma ([facility])   ? Renal insufficiency   ? Wears dentures   ? partial upper  ? ? ?Family History: ?Family History  ?Problem Relation Age of Onset  ? Breast cancer Maternal Grandmother 102  ? CAD Mother   ? CVA Mother   ? ? ?Social History  ? ?Socioeconomic History  ? Marital status: Married  ?  Spouse name: Not on file  ? Number of children: 1  ? Years of education: Not on file  ? Highest education level: Not on file  ?Occupational History  ? Not on file  ?Tobacco Use  ? Smoking status: Never  ? Smokeless tobacco: Never  ?Vaping Use  ? Vaping Use: Never used  ?Substance and Sexual Activity  ? Alcohol use: No  ? Drug use: No  ? Sexual activity: Not Currently  ?  Birth control/protection: None  ?Other Topics Concern  ? Not on file  ?Social History Narrative  ? Lives at home with her husband, independent at baseline  ? ?Social Determinants of Health  ? ?Financial Resource Strain: Low Risk   ? Difficulty of Paying Living Expenses: Not very hard  ?Food Insecurity: Not on file  ?Transportation Needs: Not on file  ?Physical Activity: Not on file  ?Stress: Not on file  ?Social Connections: Not on file  ?Intimate Partner Violence: Not on file  ? ? ? ? ?Review of Systems  ?Constitutional:  Negative for fatigue and fever.   ?HENT:  Positive for sinus pressure and sore throat. Negative for congestion, mouth sores and postnasal drip.   ?Respiratory:  Positive for cough.   ?Cardiovascular:  Negative for chest pain.  ?Genitourinary:  Negative for flank pain.  ?Psychiatric/Behavioral: Negative.    ? ?Vital Signs: ?Temp 98.2 ?F (36.8 ?C)   Ht 5' 4.5" (1.638 m)   Wt 137 lb (62.1 kg)   BMI 23.15 kg/m?  ? ? ?Observation/Objective: ?Patient is seen to video, looks ill , has been coughing, sounds congested  ? ? ? ?Assessment/Plan: ?1. Acute asthmatic bronchitis ?Will start Levaquin 500 mg po qd, continue prednisone as before   ? ?2. Multiple myeloma not having achieved remission (  [facility]) ?Per Oncology   ? ?General Counseling: narelle schoening understanding of the findings of today's phone visit and agrees with plan of treatment. I have discussed any further diagnostic evaluation that may be needed or ordered today. We also reviewed her medications today. she has been encouraged to call the office with any questions or concerns that should arise related to todays visit. ? ? ? ?No orders of the defined types were placed in this encounter. ? ? ?Meds ordered this encounter  ?Medications  ? levofloxacin (LEVAQUIN) 500 MG tablet  ?  Sig: Take 1 tablet (500 mg total) by mouth daily.  ?  Dispense:  10 tablet  ?  Refill:  0  ? ? ?Time spent:20 Minutes ? ? ? ?Dr Lavera Guise ?Internal medicine  ?

## 2022-03-28 ENCOUNTER — Encounter: Payer: Self-pay | Admitting: Nurse Practitioner

## 2022-04-07 DIAGNOSIS — D801 Nonfamilial hypogammaglobulinemia: Secondary | ICD-10-CM | POA: Diagnosis not present

## 2022-04-07 DIAGNOSIS — C9001 Multiple myeloma in remission: Secondary | ICD-10-CM | POA: Diagnosis not present

## 2022-04-07 DIAGNOSIS — Z885 Allergy status to narcotic agent status: Secondary | ICD-10-CM | POA: Diagnosis not present

## 2022-04-07 DIAGNOSIS — Z5112 Encounter for antineoplastic immunotherapy: Secondary | ICD-10-CM | POA: Diagnosis not present

## 2022-04-07 DIAGNOSIS — E538 Deficiency of other specified B group vitamins: Secondary | ICD-10-CM | POA: Diagnosis not present

## 2022-04-07 DIAGNOSIS — F419 Anxiety disorder, unspecified: Secondary | ICD-10-CM | POA: Diagnosis not present

## 2022-04-07 DIAGNOSIS — C9002 Multiple myeloma in relapse: Secondary | ICD-10-CM | POA: Diagnosis not present

## 2022-04-07 DIAGNOSIS — Z882 Allergy status to sulfonamides status: Secondary | ICD-10-CM | POA: Diagnosis not present

## 2022-04-07 DIAGNOSIS — D701 Agranulocytosis secondary to cancer chemotherapy: Secondary | ICD-10-CM | POA: Diagnosis not present

## 2022-04-07 DIAGNOSIS — E041 Nontoxic single thyroid nodule: Secondary | ICD-10-CM | POA: Diagnosis not present

## 2022-04-07 DIAGNOSIS — C9000 Multiple myeloma not having achieved remission: Secondary | ICD-10-CM | POA: Diagnosis not present

## 2022-04-07 DIAGNOSIS — Z79899 Other long term (current) drug therapy: Secondary | ICD-10-CM | POA: Diagnosis not present

## 2022-04-07 DIAGNOSIS — R0609 Other forms of dyspnea: Secondary | ICD-10-CM | POA: Diagnosis not present

## 2022-04-07 DIAGNOSIS — Z886 Allergy status to analgesic agent status: Secondary | ICD-10-CM | POA: Diagnosis not present

## 2022-04-07 DIAGNOSIS — T451X5A Adverse effect of antineoplastic and immunosuppressive drugs, initial encounter: Secondary | ICD-10-CM | POA: Diagnosis not present

## 2022-04-07 DIAGNOSIS — Z881 Allergy status to other antibiotic agents status: Secondary | ICD-10-CM | POA: Diagnosis not present

## 2022-04-07 DIAGNOSIS — G4700 Insomnia, unspecified: Secondary | ICD-10-CM | POA: Diagnosis not present

## 2022-04-07 DIAGNOSIS — Z880 Allergy status to penicillin: Secondary | ICD-10-CM | POA: Diagnosis not present

## 2022-04-16 ENCOUNTER — Encounter: Payer: Self-pay | Admitting: Nurse Practitioner

## 2022-04-16 ENCOUNTER — Other Ambulatory Visit: Payer: Self-pay | Admitting: Nurse Practitioner

## 2022-04-16 ENCOUNTER — Ambulatory Visit (INDEPENDENT_AMBULATORY_CARE_PROVIDER_SITE_OTHER): Payer: PPO | Admitting: Nurse Practitioner

## 2022-04-16 VITALS — BP 122/80 | HR 75 | Temp 98.1°F | Resp 16 | Ht 64.5 in | Wt 136.0 lb

## 2022-04-16 DIAGNOSIS — I10 Essential (primary) hypertension: Secondary | ICD-10-CM

## 2022-04-16 DIAGNOSIS — M7551 Bursitis of right shoulder: Secondary | ICD-10-CM | POA: Diagnosis not present

## 2022-04-16 DIAGNOSIS — Z1231 Encounter for screening mammogram for malignant neoplasm of breast: Secondary | ICD-10-CM

## 2022-04-16 DIAGNOSIS — C9001 Multiple myeloma in remission: Secondary | ICD-10-CM | POA: Diagnosis not present

## 2022-04-16 MED ORDER — LISINOPRIL 10 MG PO TABS: 10.0000 mg | ORAL_TABLET | Freq: Every day | ORAL | 3 refills | Status: DC

## 2022-04-16 NOTE — Progress Notes (Signed)
[facility] [place], [place] [address]  Internal MEDICINE  Office Visit Note  Patient Name: Sherry Oneal  [id number]  [id number]  Date of Service: 04/16/2022  Chief Complaint  Patient presents with   Follow-up    Difficulty hearing in left ear, discuss BP meds   Hypertension   Hyperlipidemia   Anemia    HPI Tahjanae presents for a follow-up visit for hypertension, hyperlipidemia and right shoulder pain, patient is also having left ear pain and muffled hearing.  Patient reports that her right shoulder is feeling better is not having any issues at this time.  Her lisinopril dose was increased to 10 mg at her previous office visit.  Her blood pressure has been improving since her previous office visit.  She needs to schedule her mammogram echocardiogram ordered.  She has also been having left ear pressure, muffled hearing and some pain and wants this to be evaluated as well   Current Medication: Outpatient Encounter Medications as of 04/16/2022  Medication Sig   acetaminophen (TYLENOL) 500 MG tablet Take 650 mg by mouth in the morning and at bedtime.   ALPRAZolam (XANAX) 0.25 MG tablet Take 1 tablet (0.25 mg total) by mouth at bedtime as needed.   atorvastatin (LIPITOR) 10 MG tablet Take 1 tablet (10 mg total) by mouth daily.   Calcium Carbonate-Vitamin D 600-400 MG-UNIT tablet Take 1 tablet by mouth daily.   Cranberry 500 MG CAPS Take 500 mg by mouth every morning.   DARATUMUMAB IV Inject into the vein every 30 (thirty) days.   dicyclomine (BENTYL) 10 MG capsule TAKE 1 CAPSULE BY MOUTH 3 TIMES DAILY AS NEEDED FOR SPASMS. TAKE WITH MEALS.   diphenhydrAMINE (BENADRYL) 25 MG tablet Take 25 mg by mouth daily as needed.   docusate (COLACE) 50 MG/5ML liquid Take by mouth daily as needed for mild constipation.   ELIQUIS 5 MG TABS tablet Take 1 tablet by mouth 2 (two) times daily.   fluticasone (FLONASE) 50 MCG/ACT nasal spray Place 2 sprays into both nostrils daily.    ipratropium-albuterol (DUONEB) 0.5-2.5 (3) MG/3ML SOLN Take 3 mLs by nebulization every 6 (six) hours as needed.   levofloxacin (LEVAQUIN) 500 MG tablet Take 1 tablet (500 mg total) by mouth daily.   meclizine (ANTIVERT) 25 MG tablet Take 25 mg by mouth as needed for dizziness.   Multiple Vitamins-Minerals (PRESERVISION AREDS PO) Take 1 tablet by mouth every morning.   omeprazole (PRILOSEC) 20 MG capsule Take 20 mg by mouth daily.   ondansetron (ZOFRAN-ODT) 4 MG disintegrating tablet DISSOLVE 1 TABLET IN MOUTH EVERY 8 HOURS AS NEEDED FOR NAUSEA   polyethylene glycol (MIRALAX / GLYCOLAX) packet Take 17 g by mouth daily as needed.   pomalidomide (POMALYST) 4 MG capsule Take 1 capsule by mouth once daily for days 1-21. 7 days rest.   predniSONE (DELTASONE) 10 MG tablet Take one tab 3 x day for 3 days, then take one tab 2 x a day for 3 days and then take one tab a day for 3 days for copd   prochlorperazine (COMPAZINE) 5 MG tablet Take 5 mg by mouth every 8 (eight) hours as needed.   promethazine (PHENERGAN) 25 MG tablet Take 1 tablet (25 mg total) by mouth every 6 (six) hours as needed for nausea or vomiting.   senna (SENOKOT) 8.6 MG tablet Take 1 tablet by mouth daily as needed for constipation.   valACYclovir (VALTREX) 500 MG tablet Take 500 mg by mouth daily.  vitamin B-12 (CYANOCOBALAMIN) 500 MCG tablet Take 500 mcg by mouth daily.   [DISCONTINUED] lisinopril (ZESTRIL) 10 MG tablet Take 1 tablet (10 mg total) by mouth daily.   lisinopril (ZESTRIL) 10 MG tablet Take 1 tablet (10 mg total) by mouth daily.   No facility-administered encounter medications on file as of 04/16/2022.    Surgical History: Past Surgical History:  Procedure Laterality Date   CATARACT EXTRACTION W/PHACO Right 08/17/2018   Procedure: CATARACT EXTRACTION PHACO AND INTRAOCULAR LENS PLACEMENT ([place])  RIGHT;  Surgeon: Leandrew Koyanagi, MD;  Location: [facility];  Service: Ophthalmology;  Laterality: Right;    CATARACT EXTRACTION W/PHACO Left 11/09/2018   Procedure: CATARACT EXTRACTION PHACO AND INTRAOCULAR LENS PLACEMENT ([place]) LEFT;  Surgeon: Leandrew Koyanagi, MD;  Location: [facility];  Service: Ophthalmology;  Laterality: Left;   EXPLORATORY LAPAROTOMY      Medical History: Past Medical History:  Diagnosis Date   Anemia    Arthritis    lower back   High blood pressure    High cholesterol    History of actinic keratosis    Macular degeneration    recieving 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    Multiple myeloma ([facility])    Renal insufficiency    Wears dentures    partial upper    Family History: Family History  Problem Relation Age of Onset   Breast cancer Maternal Grandmother 51   CAD Mother    CVA Mother     Social History   Socioeconomic History   Marital status: Married    Spouse name: Not on file   Number of children: 1   Years of education: Not on file   Highest education level: Not on file  Occupational History   Not on file  Tobacco Use   Smoking status: Never   Smokeless tobacco: Never  Vaping Use   Vaping Use: Never used  Substance and Sexual Activity   Alcohol use: No   Drug use: No   Sexual activity: Not Currently    Birth control/protection: None  Other Topics Concern   Not on file  Social History Narrative   Lives at home with her husband, independent at baseline   Social Determinants of Health   Financial Resource Strain: Low Risk    Difficulty of Paying Living Expenses: Not very hard  Food Insecurity: Not on file  Transportation Needs: Not on file  Physical Activity: Not on file  Stress: Not on file  Social Connections: Not on file  Intimate Partner Violence: Not on file      Review of Systems  Constitutional:  Negative for chills, fatigue and unexpected weight change.  HENT:  Negative for congestion, rhinorrhea, sneezing and sore throat.   Eyes:  Negative for redness.  Respiratory: Negative.   Negative for cough, chest tightness, shortness of breath and wheezing.   Cardiovascular: Negative.  Negative for chest pain and palpitations.  Gastrointestinal:  Negative for abdominal pain, constipation, diarrhea, nausea and vomiting.  Genitourinary:  Negative for dysuria and frequency.  Musculoskeletal:  Positive for arthralgias, back pain and neck pain. Negative for joint swelling.  Skin:  Negative for rash.  Neurological: Negative.  Negative for tremors and numbness.  Hematological:  Negative for adenopathy. Does not bruise/bleed easily.  Psychiatric/Behavioral:  Negative for behavioral problems (Depression), self-injury, sleep disturbance and suicidal ideas. The patient is not nervous/anxious.    Vital Signs: BP 122/80   Pulse 75   Temp 98.1 F (36.7  C)   Resp 16   Ht 5' 4.5" (1.638 m)   Wt 136 lb (61.7 kg)   SpO2 98%   BMI 22.98 kg/m    Physical Exam Vitals reviewed.  Constitutional:      General: She is not in acute distress.    Appearance: Normal appearance. She is normal weight. She is not ill-appearing.  HENT:     Head: Normocephalic and atraumatic.  Eyes:     Pupils: Pupils are equal, round, and reactive to light.  Cardiovascular:     Rate and Rhythm: Normal rate and regular rhythm.  Pulmonary:     Effort: Pulmonary effort is normal. No respiratory distress.  Neurological:     Mental Status: She is alert and oriented to person, place, and time.  Psychiatric:        Mood and Affect: Mood normal.        Behavior: Behavior normal.       Assessment/Plan: 1. Essential hypertension Blood pressure under better control increase lisinopril dose, continue as prescribed, refills ordered. - lisinopril (ZESTRIL) 10 MG tablet; Take 1 tablet (10 mg total) by mouth daily.  Dispense: 90 tablet; Refill: 3  2. Bursitis of right shoulder Shoulder and right arm pain has resolved after prednisone taper.  3. Multiple myeloma in remission ([facility]) No changes since previous  office visit, continues to be followed by oncology.   General Counseling: everly rubalcava understanding of the findings of todays visit and agrees with plan of treatment. I have discussed any further diagnostic evaluation that may be needed or ordered today. We also reviewed her medications today. she has been encouraged to call the office with any questions or concerns that should arise related to todays visit.    No orders of the defined types were placed in this encounter.   Meds ordered this encounter  Medications   lisinopril (ZESTRIL) 10 MG tablet    Sig: Take 1 tablet (10 mg total) by mouth daily.    Dispense:  90 tablet    Refill:  3    Patient requesting 90 day supply, please discontinue all previous orders for lisinopril    Return for F/U, Burna Atlas PCP  please cancel june 16 appt and schedule in august.   Total time spent:30 Minutes Time spent includes review of chart, medications, test results, and follow up plan with the patient.   [place] Controlled Substance Database was reviewed by me.  This patient was seen by [name], FNP-C in collaboration with Dr. Clayborn Bigness as a part of collaborative care agreement.   Jaelee Laughter R. Valetta Fuller, MSN, FNP-C Internal medicine

## 2022-04-27 DIAGNOSIS — H353221 Exudative age-related macular degeneration, left eye, with active choroidal neovascularization: Secondary | ICD-10-CM | POA: Diagnosis not present

## 2022-04-29 ENCOUNTER — Telehealth: Payer: PPO

## 2022-05-05 DIAGNOSIS — Z5112 Encounter for antineoplastic immunotherapy: Secondary | ICD-10-CM | POA: Diagnosis not present

## 2022-05-05 DIAGNOSIS — C9001 Multiple myeloma in remission: Secondary | ICD-10-CM | POA: Diagnosis not present

## 2022-05-05 DIAGNOSIS — C9000 Multiple myeloma not having achieved remission: Secondary | ICD-10-CM | POA: Diagnosis not present

## 2022-05-05 DIAGNOSIS — R0609 Other forms of dyspnea: Secondary | ICD-10-CM | POA: Diagnosis not present

## 2022-05-17 ENCOUNTER — Encounter: Payer: Self-pay | Admitting: Nurse Practitioner

## 2022-05-20 ENCOUNTER — Ambulatory Visit
Admission: RE | Admit: 2022-05-20 | Discharge: 2022-05-20 | Disposition: A | Discharge status: Home / Self Care | Payer: PPO | Source: Ambulatory Visit | Attending: Nurse Practitioner | Admitting: Nurse Practitioner

## 2022-05-20 DIAGNOSIS — Z1231 Encounter for screening mammogram for malignant neoplasm of breast: Secondary | ICD-10-CM | POA: Insufficient documentation

## 2022-06-02 DIAGNOSIS — F32A Depression, unspecified: Secondary | ICD-10-CM | POA: Diagnosis not present

## 2022-06-02 DIAGNOSIS — D709 Neutropenia, unspecified: Secondary | ICD-10-CM | POA: Diagnosis not present

## 2022-06-02 DIAGNOSIS — F419 Anxiety disorder, unspecified: Secondary | ICD-10-CM | POA: Diagnosis not present

## 2022-06-02 DIAGNOSIS — Z882 Allergy status to sulfonamides status: Secondary | ICD-10-CM | POA: Diagnosis not present

## 2022-06-02 DIAGNOSIS — C9001 Multiple myeloma in remission: Secondary | ICD-10-CM | POA: Diagnosis not present

## 2022-06-02 DIAGNOSIS — G4700 Insomnia, unspecified: Secondary | ICD-10-CM | POA: Diagnosis not present

## 2022-06-02 DIAGNOSIS — D801 Nonfamilial hypogammaglobulinemia: Secondary | ICD-10-CM | POA: Diagnosis not present

## 2022-06-02 DIAGNOSIS — R0609 Other forms of dyspnea: Secondary | ICD-10-CM | POA: Diagnosis not present

## 2022-06-02 DIAGNOSIS — Z886 Allergy status to analgesic agent status: Secondary | ICD-10-CM | POA: Diagnosis not present

## 2022-06-02 DIAGNOSIS — Z885 Allergy status to narcotic agent status: Secondary | ICD-10-CM | POA: Diagnosis not present

## 2022-06-02 DIAGNOSIS — Z881 Allergy status to other antibiotic agents status: Secondary | ICD-10-CM | POA: Diagnosis not present

## 2022-06-02 DIAGNOSIS — E538 Deficiency of other specified B group vitamins: Secondary | ICD-10-CM | POA: Diagnosis not present

## 2022-06-02 DIAGNOSIS — D61810 Antineoplastic chemotherapy induced pancytopenia: Secondary | ICD-10-CM | POA: Diagnosis not present

## 2022-06-02 DIAGNOSIS — Z880 Allergy status to penicillin: Secondary | ICD-10-CM | POA: Diagnosis not present

## 2022-06-02 DIAGNOSIS — E041 Nontoxic single thyroid nodule: Secondary | ICD-10-CM | POA: Diagnosis not present

## 2022-06-02 DIAGNOSIS — Z79899 Other long term (current) drug therapy: Secondary | ICD-10-CM | POA: Diagnosis not present

## 2022-06-12 ENCOUNTER — Ambulatory Visit: Payer: PPO | Admitting: Nurse Practitioner

## 2022-06-15 DIAGNOSIS — H353221 Exudative age-related macular degeneration, left eye, with active choroidal neovascularization: Secondary | ICD-10-CM | POA: Diagnosis not present

## 2022-06-16 DIAGNOSIS — D84821 Immunodeficiency due to drugs: Secondary | ICD-10-CM | POA: Diagnosis not present

## 2022-06-16 DIAGNOSIS — D801 Nonfamilial hypogammaglobulinemia: Secondary | ICD-10-CM | POA: Diagnosis not present

## 2022-06-16 DIAGNOSIS — T451X5A Adverse effect of antineoplastic and immunosuppressive drugs, initial encounter: Secondary | ICD-10-CM | POA: Diagnosis not present

## 2022-06-16 DIAGNOSIS — D701 Agranulocytosis secondary to cancer chemotherapy: Secondary | ICD-10-CM | POA: Diagnosis not present

## 2022-06-16 DIAGNOSIS — Z79899 Other long term (current) drug therapy: Secondary | ICD-10-CM | POA: Diagnosis not present

## 2022-06-16 DIAGNOSIS — C9001 Multiple myeloma in remission: Secondary | ICD-10-CM | POA: Diagnosis not present

## 2022-06-16 DIAGNOSIS — C9002 Multiple myeloma in relapse: Secondary | ICD-10-CM | POA: Diagnosis not present

## 2022-07-01 DIAGNOSIS — Z5112 Encounter for antineoplastic immunotherapy: Secondary | ICD-10-CM | POA: Diagnosis not present

## 2022-07-01 DIAGNOSIS — R768 Other specified abnormal immunological findings in serum: Secondary | ICD-10-CM | POA: Diagnosis not present

## 2022-07-01 DIAGNOSIS — C9002 Multiple myeloma in relapse: Secondary | ICD-10-CM | POA: Diagnosis not present

## 2022-07-01 DIAGNOSIS — R0609 Other forms of dyspnea: Secondary | ICD-10-CM | POA: Diagnosis not present

## 2022-07-07 DIAGNOSIS — D801 Nonfamilial hypogammaglobulinemia: Secondary | ICD-10-CM | POA: Diagnosis not present

## 2022-07-07 DIAGNOSIS — D701 Agranulocytosis secondary to cancer chemotherapy: Secondary | ICD-10-CM | POA: Diagnosis not present

## 2022-07-07 DIAGNOSIS — T451X5A Adverse effect of antineoplastic and immunosuppressive drugs, initial encounter: Secondary | ICD-10-CM | POA: Diagnosis not present

## 2022-07-07 DIAGNOSIS — D84821 Immunodeficiency due to drugs: Secondary | ICD-10-CM | POA: Diagnosis not present

## 2022-07-07 DIAGNOSIS — C9002 Multiple myeloma in relapse: Secondary | ICD-10-CM | POA: Diagnosis not present

## 2022-07-07 DIAGNOSIS — Z79899 Other long term (current) drug therapy: Secondary | ICD-10-CM | POA: Diagnosis not present

## 2022-07-20 DIAGNOSIS — H353221 Exudative age-related macular degeneration, left eye, with active choroidal neovascularization: Secondary | ICD-10-CM | POA: Diagnosis not present

## 2022-07-28 DIAGNOSIS — T451X5A Adverse effect of antineoplastic and immunosuppressive drugs, initial encounter: Secondary | ICD-10-CM | POA: Diagnosis not present

## 2022-07-28 DIAGNOSIS — F32A Depression, unspecified: Secondary | ICD-10-CM | POA: Diagnosis not present

## 2022-07-28 DIAGNOSIS — Z881 Allergy status to other antibiotic agents status: Secondary | ICD-10-CM | POA: Diagnosis not present

## 2022-07-28 DIAGNOSIS — D801 Nonfamilial hypogammaglobulinemia: Secondary | ICD-10-CM | POA: Diagnosis not present

## 2022-07-28 DIAGNOSIS — Z880 Allergy status to penicillin: Secondary | ICD-10-CM | POA: Diagnosis not present

## 2022-07-28 DIAGNOSIS — D701 Agranulocytosis secondary to cancer chemotherapy: Secondary | ICD-10-CM | POA: Diagnosis not present

## 2022-07-28 DIAGNOSIS — D704 Cyclic neutropenia: Secondary | ICD-10-CM | POA: Diagnosis not present

## 2022-07-28 DIAGNOSIS — F419 Anxiety disorder, unspecified: Secondary | ICD-10-CM | POA: Diagnosis not present

## 2022-07-28 DIAGNOSIS — Z882 Allergy status to sulfonamides status: Secondary | ICD-10-CM | POA: Diagnosis not present

## 2022-07-28 DIAGNOSIS — H3530 Unspecified macular degeneration: Secondary | ICD-10-CM | POA: Diagnosis not present

## 2022-07-28 DIAGNOSIS — E041 Nontoxic single thyroid nodule: Secondary | ICD-10-CM | POA: Diagnosis not present

## 2022-07-28 DIAGNOSIS — F411 Generalized anxiety disorder: Secondary | ICD-10-CM | POA: Diagnosis not present

## 2022-07-28 DIAGNOSIS — G4700 Insomnia, unspecified: Secondary | ICD-10-CM | POA: Diagnosis not present

## 2022-07-28 DIAGNOSIS — Z7901 Long term (current) use of anticoagulants: Secondary | ICD-10-CM | POA: Diagnosis not present

## 2022-07-28 DIAGNOSIS — Z886 Allergy status to analgesic agent status: Secondary | ICD-10-CM | POA: Diagnosis not present

## 2022-07-28 DIAGNOSIS — D61810 Antineoplastic chemotherapy induced pancytopenia: Secondary | ICD-10-CM | POA: Diagnosis not present

## 2022-07-28 DIAGNOSIS — Z79899 Other long term (current) drug therapy: Secondary | ICD-10-CM | POA: Diagnosis not present

## 2022-07-28 DIAGNOSIS — Z885 Allergy status to narcotic agent status: Secondary | ICD-10-CM | POA: Diagnosis not present

## 2022-07-28 DIAGNOSIS — R110 Nausea: Secondary | ICD-10-CM | POA: Diagnosis not present

## 2022-07-28 DIAGNOSIS — R0609 Other forms of dyspnea: Secondary | ICD-10-CM | POA: Diagnosis not present

## 2022-07-28 DIAGNOSIS — Z5112 Encounter for antineoplastic immunotherapy: Secondary | ICD-10-CM | POA: Diagnosis not present

## 2022-07-28 DIAGNOSIS — E538 Deficiency of other specified B group vitamins: Secondary | ICD-10-CM | POA: Diagnosis not present

## 2022-07-28 DIAGNOSIS — C9002 Multiple myeloma in relapse: Secondary | ICD-10-CM | POA: Diagnosis not present

## 2022-08-04 ENCOUNTER — Other Ambulatory Visit: Payer: Self-pay | Admitting: Nurse Practitioner

## 2022-08-04 DIAGNOSIS — E782 Mixed hyperlipidemia: Secondary | ICD-10-CM

## 2022-08-13 ENCOUNTER — Encounter: Payer: Self-pay | Admitting: Nurse Practitioner

## 2022-08-13 ENCOUNTER — Ambulatory Visit (INDEPENDENT_AMBULATORY_CARE_PROVIDER_SITE_OTHER): Payer: PPO | Admitting: Nurse Practitioner

## 2022-08-13 VITALS — BP 110/52 | HR 60 | Temp 98.4°F | Resp 16 | Ht 64.5 in | Wt 136.0 lb

## 2022-08-13 DIAGNOSIS — F411 Generalized anxiety disorder: Secondary | ICD-10-CM | POA: Diagnosis not present

## 2022-08-13 DIAGNOSIS — C9000 Multiple myeloma not having achieved remission: Secondary | ICD-10-CM

## 2022-08-13 DIAGNOSIS — I10 Essential (primary) hypertension: Secondary | ICD-10-CM

## 2022-08-13 MED ORDER — ALPRAZOLAM 0.25 MG PO TABS: 0.2500 mg | ORAL_TABLET | Freq: Two times a day (BID) | ORAL | 2 refills | Status: DC | PRN

## 2022-08-13 NOTE — Progress Notes (Signed)
[facility] [address] [place], [place] [address]  Internal MEDICINE  Office Visit Note  Patient Name: Sherry Oneal  [id number]  [id number]  Date of Service: 08/13/2022  Chief Complaint  Patient presents with   Follow-up   Hyperlipidemia   Hypertension    HPI Sherry Oneal presents for a follow up visit for hypertension, anxiety, and multiple myeloma.  Hypertension -- BP ok, DBP slightly low but pt asymptomatic. Takes lisinopril 10m daily.  Anxiety -- prn alprazolam, takes only when needed. Not taking any any antidepressant or baseline/maintenance anxiety medication. Pt's husband is rehab after hospital stay for lung issues, contributing to her anxiety somewhat. Started having to drive self now after not driving for 3-4 years.  Multiple myeloma -- followed by oncology, on current tx >5 years. They are looking for an alternative tx that may work better for patient    Current Medication: Outpatient Encounter Medications as of 08/13/2022  Medication Sig   acetaminophen (TYLENOL) 500 MG tablet Take 650 mg by mouth in the morning and at bedtime.   atorvastatin (LIPITOR) 10 MG tablet Take 1 tablet by mouth once daily   Calcium Carbonate-Vitamin D 600-400 MG-UNIT tablet Take 1 tablet by mouth daily.   Cranberry 500 MG CAPS Take 500 mg by mouth every morning.   DARATUMUMAB IV Inject into the vein every 30 (thirty) days.   dicyclomine (BENTYL) 10 MG capsule TAKE 1 CAPSULE BY MOUTH 3 TIMES DAILY AS NEEDED FOR SPASMS. TAKE WITH MEALS.   diphenhydrAMINE (BENADRYL) 25 MG tablet Take 25 mg by mouth daily as needed.   docusate (COLACE) 50 MG/5ML liquid Take by mouth daily as needed for mild constipation.   ELIQUIS 5 MG TABS tablet Take 1 tablet by mouth 2 (two) times daily.   fluticasone (FLONASE) 50 MCG/ACT nasal spray Place 2 sprays into both nostrils daily.   ipratropium-albuterol (DUONEB) 0.5-2.5 (3) MG/3ML SOLN Take 3 mLs by nebulization every 6 (six) hours as needed.    levofloxacin (LEVAQUIN) 500 MG tablet Take 1 tablet (500 mg total) by mouth daily.   lisinopril (ZESTRIL) 10 MG tablet Take 1 tablet (10 mg total) by mouth daily.   meclizine (ANTIVERT) 25 MG tablet Take 25 mg by mouth as needed for dizziness.   Multiple Vitamins-Minerals (PRESERVISION AREDS PO) Take 1 tablet by mouth every morning.   omeprazole (PRILOSEC) 20 MG capsule Take 20 mg by mouth daily.   ondansetron (ZOFRAN-ODT) 4 MG disintegrating tablet DISSOLVE 1 TABLET IN MOUTH EVERY 8 HOURS AS NEEDED FOR NAUSEA   polyethylene glycol (MIRALAX / GLYCOLAX) packet Take 17 g by mouth daily as needed.   pomalidomide (POMALYST) 4 MG capsule Take 1 capsule by mouth once daily for days 1-21. 7 days rest.   predniSONE (DELTASONE) 10 MG tablet Take one tab 3 x day for 3 days, then take one tab 2 x a day for 3 days and then take one tab a day for 3 days for copd   prochlorperazine (COMPAZINE) 5 MG tablet Take 5 mg by mouth every 8 (eight) hours as needed.   promethazine (PHENERGAN) 25 MG tablet Take 1 tablet (25 mg total) by mouth every 6 (six) hours as needed for nausea or vomiting.   senna (SENOKOT) 8.6 MG tablet Take 1 tablet by mouth daily as needed for constipation.   valACYclovir (VALTREX) 500 MG tablet Take 500 mg by mouth daily.   vitamin B-12 (CYANOCOBALAMIN) 500 MCG tablet Take 500 mcg by mouth daily.   [DISCONTINUED] ALPRAZolam (  XANAX) 0.25 MG tablet Take 1 tablet (0.25 mg total) by mouth at bedtime as needed.   ALPRAZolam (XANAX) 0.25 MG tablet Take 1 tablet (0.25 mg total) by mouth 2 (two) times daily as needed for anxiety or sleep.   No facility-administered encounter medications on file as of 08/13/2022.    Surgical History: Past Surgical History:  Procedure Laterality Date   CATARACT EXTRACTION W/PHACO Right 08/17/2018   Procedure: CATARACT EXTRACTION PHACO AND INTRAOCULAR LENS PLACEMENT ([place])  RIGHT;  Surgeon: Leandrew Koyanagi, MD;  Location: [facility];  Service:  Ophthalmology;  Laterality: Right;   CATARACT EXTRACTION W/PHACO Left 11/09/2018   Procedure: CATARACT EXTRACTION PHACO AND INTRAOCULAR LENS PLACEMENT ([place]) LEFT;  Surgeon: Leandrew Koyanagi, MD;  Location: [facility];  Service: Ophthalmology;  Laterality: Left;   EXPLORATORY LAPAROTOMY      Medical History: Past Medical History:  Diagnosis Date   Anemia    Arthritis    lower back   High blood pressure    High cholesterol    History of actinic keratosis    Macular degeneration    recieving 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    Multiple myeloma (HCC)    Renal insufficiency    Wears dentures    partial upper    Family History: Family History  Problem Relation Age of Onset   Breast cancer Maternal Grandmother 86   CAD Mother    CVA Mother     Social History   Socioeconomic History   Marital status: Married    Spouse name: Not on file   Number of children: 1   Years of education: Not on file   Highest education level: Not on file  Occupational History   Not on file  Tobacco Use   Smoking status: Never   Smokeless tobacco: Never  Vaping Use   Vaping Use: Never used  Substance and Sexual Activity   Alcohol use: No   Drug use: No   Sexual activity: Not Currently    Birth control/protection: None  Other Topics Concern   Not on file  Social History Narrative   Lives at home with her husband, independent at baseline   Social Determinants of Health   Financial Resource Strain: [place]  (04/16/2021)   Overall Financial Resource Strain (CARDIA)    Difficulty of Paying Living Expenses: Not very hard  Food Insecurity: No Food Insecurity (03/22/2018)   Hunger Vital Sign    Worried About Running Out of Food in the Last Year: Never true    [place] in the Last Year: Never true  Transportation Needs: No Transportation Needs (03/22/2018)   PRAPARE - Hydrologist (Medical): No    Lack of  Transportation (Non-Medical): No  Physical Activity: Inactive (03/22/2018)   Exercise Vital Sign    Days of Exercise per Week: 0 days    Minutes of Exercise per Session: 0 min  Stress: No Stress Concern Present (03/22/2018)   [place]    Feeling of Stress : Only a little  Social Connections: Moderately Integrated (03/22/2018)   Social Connection and Isolation Panel [NHANES]    Frequency of Communication with Friends and Family: More than three times a week    Frequency of Social Gatherings with Friends and Family: Three times a week    Attends Religious Services: 1 to 4 times per year    Active Member of  Clubs or Organizations: No    Attends Archivist Meetings: Never    Marital Status: Married  Human resources officer Violence: Not At Risk (03/22/2018)   Humiliation, Afraid, Rape, and Kick questionnaire    Fear of Current or Ex-Partner: No    Emotionally Abused: No    Physically Abused: No    Sexually Abused: No      Review of Systems  Constitutional:  Negative for chills, fatigue and unexpected weight change.  HENT:  Negative for congestion, rhinorrhea, sneezing and sore throat.   Eyes:  Negative for redness.  Respiratory: Negative.  Negative for cough, chest tightness, shortness of breath and wheezing.   Cardiovascular: Negative.  Negative for chest pain and palpitations.  Gastrointestinal:  Negative for abdominal pain, constipation, diarrhea, nausea and vomiting.  Genitourinary:  Negative for dysuria and frequency.  Musculoskeletal:  Positive for arthralgias, back pain and neck pain. Negative for joint swelling.  Skin:  Negative for rash.  Neurological: Negative.  Negative for tremors and numbness.  Hematological:  Negative for adenopathy. Does not bruise/bleed easily.  Psychiatric/Behavioral:  Negative for behavioral problems (Depression), self-injury, sleep disturbance and suicidal ideas. The patient is  nervous/anxious.     Vital Signs: BP (!) 110/52   Pulse 60   Temp 98.4 F (36.9 C)   Resp 16   Ht 5' 4.5" (1.638 m)   Wt 136 lb (61.7 kg)   SpO2 99%   BMI 22.98 kg/m    Physical Exam Vitals reviewed.  Constitutional:      General: She is not in acute distress.    Appearance: Normal appearance. She is normal weight. She is not ill-appearing.  HENT:     Head: Normocephalic and atraumatic.  Eyes:     Pupils: Pupils are equal, round, and reactive to light.  Cardiovascular:     Rate and Rhythm: Normal rate and regular rhythm.  Pulmonary:     Effort: Pulmonary effort is normal. No respiratory distress.  Neurological:     Mental Status: She is alert and oriented to person, place, and time.  Psychiatric:        Mood and Affect: Mood normal.        Behavior: Behavior normal.        Assessment/Plan: 1. Essential hypertension No changes, stable, continue medication as prescribed.   2. Multiple myeloma not having achieved remission ([place]) Followed by oncology, looking into alternative treatments right now  3. Generalized anxiety disorder Prn alprazolam, continue as prescribed.  - ALPRAZolam (XANAX) 0.25 MG tablet; Take 1 tablet (0.25 mg total) by mouth 2 (two) times daily as needed for anxiety or sleep.  Dispense: 60 tablet; Refill: 2   General Counseling: Deannie verbalizes understanding of the findings of todays visit and agrees with plan of treatment. I have discussed any further diagnostic evaluation that may be needed or ordered today. We also reviewed her medications today. she has been encouraged to call the office with any questions or concerns that should arise related to todays visit.    No orders of the defined types were placed in this encounter.   Meds ordered this encounter  Medications   ALPRAZolam (XANAX) 0.25 MG tablet    Sig: Take 1 tablet (0.25 mg total) by mouth 2 (two) times daily as needed for anxiety or sleep.    Dispense:  60 tablet    Refill:   2    Please note change in frequency and # of tablets, please fill today thanks.  Return in about 3 months (around 11/13/2022) for F/U, Alleah Dearman PCP.   Total time spent:30 Minutes Time spent includes review of chart, medications, test results, and follow up plan with the patient.   [place] Controlled Substance Database was reviewed by me.  This patient was seen by [name], FNP-C in collaboration with Dr. Clayborn Bigness as a part of collaborative care agreement.   Mccade Sullenberger R. Valetta Fuller, MSN, FNP-C Internal medicine

## 2022-08-25 DIAGNOSIS — R0609 Other forms of dyspnea: Secondary | ICD-10-CM | POA: Diagnosis not present

## 2022-08-25 DIAGNOSIS — C9002 Multiple myeloma in relapse: Secondary | ICD-10-CM | POA: Diagnosis not present

## 2022-08-25 DIAGNOSIS — Z5112 Encounter for antineoplastic immunotherapy: Secondary | ICD-10-CM | POA: Diagnosis not present

## 2022-09-01 DIAGNOSIS — H353221 Exudative age-related macular degeneration, left eye, with active choroidal neovascularization: Secondary | ICD-10-CM | POA: Diagnosis not present

## 2022-09-10 DIAGNOSIS — H40003 Preglaucoma, unspecified, bilateral: Secondary | ICD-10-CM | POA: Diagnosis not present

## 2022-09-22 DIAGNOSIS — C9000 Multiple myeloma not having achieved remission: Secondary | ICD-10-CM | POA: Diagnosis not present

## 2022-09-22 DIAGNOSIS — F419 Anxiety disorder, unspecified: Secondary | ICD-10-CM | POA: Diagnosis not present

## 2022-09-22 DIAGNOSIS — N281 Cyst of kidney, acquired: Secondary | ICD-10-CM | POA: Diagnosis not present

## 2022-09-22 DIAGNOSIS — N261 Atrophy of kidney (terminal): Secondary | ICD-10-CM | POA: Diagnosis not present

## 2022-09-22 DIAGNOSIS — F32A Depression, unspecified: Secondary | ICD-10-CM | POA: Diagnosis not present

## 2022-09-22 DIAGNOSIS — Z5112 Encounter for antineoplastic immunotherapy: Secondary | ICD-10-CM | POA: Diagnosis not present

## 2022-09-22 DIAGNOSIS — Z79899 Other long term (current) drug therapy: Secondary | ICD-10-CM | POA: Diagnosis not present

## 2022-09-22 DIAGNOSIS — E538 Deficiency of other specified B group vitamins: Secondary | ICD-10-CM | POA: Diagnosis not present

## 2022-09-22 DIAGNOSIS — G4700 Insomnia, unspecified: Secondary | ICD-10-CM | POA: Diagnosis not present

## 2022-09-22 DIAGNOSIS — D801 Nonfamilial hypogammaglobulinemia: Secondary | ICD-10-CM | POA: Diagnosis not present

## 2022-09-22 DIAGNOSIS — T451X5A Adverse effect of antineoplastic and immunosuppressive drugs, initial encounter: Secondary | ICD-10-CM | POA: Diagnosis not present

## 2022-09-22 DIAGNOSIS — H3530 Unspecified macular degeneration: Secondary | ICD-10-CM | POA: Diagnosis not present

## 2022-09-22 DIAGNOSIS — C9002 Multiple myeloma in relapse: Secondary | ICD-10-CM | POA: Diagnosis not present

## 2022-09-22 DIAGNOSIS — D701 Agranulocytosis secondary to cancer chemotherapy: Secondary | ICD-10-CM | POA: Diagnosis not present

## 2022-09-22 DIAGNOSIS — R0609 Other forms of dyspnea: Secondary | ICD-10-CM | POA: Diagnosis not present

## 2022-09-22 DIAGNOSIS — E041 Nontoxic single thyroid nodule: Secondary | ICD-10-CM | POA: Diagnosis not present

## 2022-09-22 DIAGNOSIS — R5383 Other fatigue: Secondary | ICD-10-CM | POA: Diagnosis not present

## 2022-09-22 DIAGNOSIS — D61810 Antineoplastic chemotherapy induced pancytopenia: Secondary | ICD-10-CM | POA: Diagnosis not present

## 2022-09-22 DIAGNOSIS — M8588 Other specified disorders of bone density and structure, other site: Secondary | ICD-10-CM | POA: Diagnosis not present

## 2022-09-23 ENCOUNTER — Other Ambulatory Visit: Payer: Self-pay | Admitting: Nurse Practitioner

## 2022-09-23 DIAGNOSIS — E782 Mixed hyperlipidemia: Secondary | ICD-10-CM

## 2022-10-01 ENCOUNTER — Encounter: Payer: Self-pay | Admitting: Nurse Practitioner

## 2022-10-01 ENCOUNTER — Ambulatory Visit
Admission: RE | Admit: 2022-10-01 | Discharge: 2022-10-01 | Disposition: A | Discharge status: Home / Self Care | Payer: PPO | Source: Ambulatory Visit | Attending: Nurse Practitioner | Admitting: Nurse Practitioner

## 2022-10-01 ENCOUNTER — Ambulatory Visit
Admission: RE | Admit: 2022-10-01 | Discharge: 2022-10-01 | Disposition: A | Discharge status: Home / Self Care | Payer: PPO | Attending: Nurse Practitioner | Admitting: Nurse Practitioner

## 2022-10-01 ENCOUNTER — Ambulatory Visit (INDEPENDENT_AMBULATORY_CARE_PROVIDER_SITE_OTHER): Payer: PPO | Admitting: Nurse Practitioner

## 2022-10-01 VITALS — BP 110/60 | HR 63 | Temp 96.2°F | Resp 16 | Ht 64.5 in | Wt 136.0 lb

## 2022-10-01 DIAGNOSIS — N200 Calculus of kidney: Secondary | ICD-10-CM | POA: Insufficient documentation

## 2022-10-01 DIAGNOSIS — M47814 Spondylosis without myelopathy or radiculopathy, thoracic region: Secondary | ICD-10-CM | POA: Insufficient documentation

## 2022-10-01 DIAGNOSIS — C9000 Multiple myeloma not having achieved remission: Secondary | ICD-10-CM | POA: Diagnosis not present

## 2022-10-01 DIAGNOSIS — K8020 Calculus of gallbladder without cholecystitis without obstruction: Secondary | ICD-10-CM | POA: Insufficient documentation

## 2022-10-01 DIAGNOSIS — M545 Low back pain, unspecified: Secondary | ICD-10-CM

## 2022-10-01 DIAGNOSIS — F411 Generalized anxiety disorder: Secondary | ICD-10-CM | POA: Diagnosis not present

## 2022-10-01 DIAGNOSIS — M47816 Spondylosis without myelopathy or radiculopathy, lumbar region: Secondary | ICD-10-CM | POA: Diagnosis not present

## 2022-10-01 DIAGNOSIS — M549 Dorsalgia, unspecified: Secondary | ICD-10-CM | POA: Diagnosis not present

## 2022-10-01 MED ORDER — CYCLOBENZAPRINE HCL 5 MG PO TABS: 5.0000 mg | ORAL_TABLET | Freq: Every evening | ORAL | 0 refills | Status: DC | PRN

## 2022-10-01 NOTE — Progress Notes (Signed)
[facility] [address] [place], [place] [address]  Internal MEDICINE  Office Visit Note  Patient Name: Sherry Oneal  08/20/1946  [id number]  Date of Service: 10/01/2022  Chief Complaint  Patient presents with   Acute Visit    Low back pain almost a month ago and the last 2 day have been very bad     HPI Sherry Oneal presents for an acute sick visit for right sided mid and low back pain.  --initial onset was about  a month ago but it has worsened over the past 2 days.  --her multiple myeloma has gotten worse and she is not in remission. She is worried that her back is hurting because her spine is degenerating.  --in wheelchair presently, standing up and walking is very difficult and painful.  --denies any urinary urgency, frequency, dysuria, urinary discomfort.      Current Medication:  Outpatient Encounter Medications as of 10/01/2022  Medication Sig   acetaminophen (TYLENOL) 500 MG tablet Take 650 mg by mouth in the morning and at bedtime.   ALPRAZolam (XANAX) 0.25 MG tablet Take 1 tablet (0.25 mg total) by mouth 2 (two) times daily as needed for anxiety or sleep.   atorvastatin (LIPITOR) 10 MG tablet Take 1 tablet by mouth once daily   Calcium Carbonate-Vitamin D 600-400 MG-UNIT tablet Take 1 tablet by mouth daily.   Cranberry 500 MG CAPS Take 500 mg by mouth every morning.   cyclobenzaprine (FLEXERIL) 5 MG tablet Take 1 tablet (5 mg total) by mouth at bedtime as needed for muscle spasms.   DARATUMUMAB IV Inject into the vein every 30 (thirty) days.   dicyclomine (BENTYL) 10 MG capsule TAKE 1 CAPSULE BY MOUTH 3 TIMES DAILY AS NEEDED FOR SPASMS. TAKE WITH MEALS.   diphenhydrAMINE (BENADRYL) 25 MG tablet Take 25 mg by mouth daily as needed.   docusate (COLACE) 50 MG/5ML liquid Take by mouth daily as needed for mild constipation.   ELIQUIS 5 MG TABS tablet Take 1 tablet by mouth 2 (two) times daily.   fluticasone (FLONASE) 50 MCG/ACT nasal spray Place 2 sprays into  both nostrils daily.   ipratropium-albuterol (DUONEB) 0.5-2.5 (3) MG/3ML SOLN Take 3 mLs by nebulization every 6 (six) hours as needed.   levofloxacin (LEVAQUIN) 500 MG tablet Take 1 tablet (500 mg total) by mouth daily.   lisinopril (ZESTRIL) 10 MG tablet Take 1 tablet (10 mg total) by mouth daily.   meclizine (ANTIVERT) 25 MG tablet Take 25 mg by mouth as needed for dizziness.   Multiple Vitamins-Minerals (PRESERVISION AREDS PO) Take 1 tablet by mouth every morning.   omeprazole (PRILOSEC) 20 MG capsule Take 20 mg by mouth daily.   ondansetron (ZOFRAN-ODT) 4 MG disintegrating tablet DISSOLVE 1 TABLET IN MOUTH EVERY 8 HOURS AS NEEDED FOR NAUSEA   polyethylene glycol (MIRALAX / GLYCOLAX) packet Take 17 g by mouth daily as needed.   pomalidomide (POMALYST) 4 MG capsule Take 1 capsule by mouth once daily for days 1-21. 7 days rest.   predniSONE (DELTASONE) 10 MG tablet Take one tab 3 x day for 3 days, then take one tab 2 x a day for 3 days and then take one tab a day for 3 days for copd   prochlorperazine (COMPAZINE) 5 MG tablet Take 5 mg by mouth every 8 (eight) hours as needed.   promethazine (PHENERGAN) 25 MG tablet Take 1 tablet (25 mg total) by mouth every 6 (six) hours as needed for nausea or vomiting.     senna (SENOKOT) 8.6 MG tablet Take 1 tablet by mouth daily as needed for constipation.   valACYclovir (VALTREX) 500 MG tablet Take 500 mg by mouth daily.   vitamin B-12 (CYANOCOBALAMIN) 500 MCG tablet Take 500 mcg by mouth daily.   No facility-administered encounter medications on file as of 10/01/2022.      Medical History: Past Medical History:  Diagnosis Date   Anemia    Arthritis    lower back   High blood pressure    High cholesterol    History of actinic keratosis    Macular degeneration    recieving 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    Multiple myeloma (HCC)    Renal insufficiency    Wears dentures    partial upper     Vital Signs: BP  110/60 Comment: 116/43  Pulse 63   Temp (!) 96.2 F (35.7 C)   Resp 16   Ht 5' 4.5" (1.638 m)   Wt 136 lb (61.7 kg)   SpO2 97%   BMI 22.98 kg/m    Review of Systems  Constitutional:  Negative for chills, fatigue and unexpected weight change.  HENT:  Negative for congestion, rhinorrhea, sneezing and sore throat.   Eyes:  Negative for redness.  Respiratory: Negative.  Negative for cough, chest tightness, shortness of breath and wheezing.   Cardiovascular: Negative.  Negative for chest pain and palpitations.  Gastrointestinal:  Negative for abdominal pain, constipation, diarrhea, nausea and vomiting.  Genitourinary:  Negative for dysuria and frequency.  Musculoskeletal:  Positive for arthralgias, back pain and neck pain. Negative for joint swelling.  Skin:  Negative for rash.  Neurological: Negative.  Negative for tremors and numbness.  Hematological:  Negative for adenopathy. Does not bruise/bleed easily.  Psychiatric/Behavioral:  Negative for behavioral problems (Depression), self-injury, sleep disturbance and suicidal ideas. The patient is not nervous/anxious.     Physical Exam Vitals reviewed.  Constitutional:      General: She is not in acute distress.    Appearance: Normal appearance. She is normal weight. She is not ill-appearing.  HENT:     Head: Normocephalic and atraumatic.  Eyes:     Pupils: Pupils are equal, round, and reactive to light.  Cardiovascular:     Rate and Rhythm: Normal rate and regular rhythm.  Pulmonary:     Effort: Pulmonary effort is normal. No respiratory distress.  Neurological:     Mental Status: She is alert and oriented to person, place, and time.  Psychiatric:        Mood and Affect: Mood normal.        Behavior: Behavior normal.       Assessment/Plan: 1. Acute bilateral low back pain without sciatica Muscle relaxant prescribed for at night, may apply ice or heat.  - DG Lumbar Spine Complete; Future - DG Thoracic Spine W/Swimmers;  Future - cyclobenzaprine (FLEXERIL) 5 MG tablet; Take 1 tablet (5 mg total) by mouth at bedtime as needed for muscle spasms.  Dispense: 30 tablet; Refill: 0  2. Multiple myeloma not having achieved remission (HCC) Xrays to assess for further degeneration of spine - DG Lumbar Spine Complete; Future - DG Thoracic Spine W/Swimmers; Future  3. Generalized anxiety disorder Anxious about her conditions but coping as well as she can.   General Counseling: Hasana verbalizes understanding of the findings of todays visit and agrees with plan of treatment. I have discussed any further diagnostic evaluation that may be needed or ordered today. We   also reviewed her medications today. she has been encouraged to call the office with any questions or concerns that should arise related to todays visit.    Counseling:    Orders Placed This Encounter  Procedures   DG Lumbar Spine Complete   DG Thoracic Spine W/Swimmers    Meds ordered this encounter  Medications   cyclobenzaprine (FLEXERIL) 5 MG tablet    Sig: Take 1 tablet (5 mg total) by mouth at bedtime as needed for muscle spasms.    Dispense:  30 tablet    Refill:  0    Please fill today    Return in about 2 months (around 12/14/2022) for previously scheduled, AWV, Alyssa PCP.  [place] Controlled Substance Database was reviewed by me for overdose risk score (ORS)  Time spent:30 Minutes Time spent with patient included reviewing progress notes, labs, imaging studies, and discussing plan for follow up.   This patient was seen by Alyssa Abernathy, FNP-C in collaboration with Dr. Fozia Khan as a part of collaborative care agreement.  Alyssa R. Abernathy, MSN, FNP-C Internal Medicine  

## 2022-10-06 ENCOUNTER — Telehealth: Payer: Self-pay

## 2022-10-06 DIAGNOSIS — N200 Calculus of kidney: Secondary | ICD-10-CM

## 2022-10-06 NOTE — Progress Notes (Signed)
Xrays show significant degenerative changes in the lumbar and thoracic spine as well as osteoarthritis in both hips.  Incidentally, there is also a calcified gallstone and multiple kidney stones bilaterally. It is possible the back pain is from the degenerative changes of the spine but the pain may be due to multiple kidney stones.

## 2022-10-06 NOTE — Telephone Encounter (Signed)
Spoke with patient regarding x-ray results. Also notified of gallstone and kidney stone findings. Will tell Alyssa to send Urology referral, patient was compliant.

## 2022-10-06 NOTE — Telephone Encounter (Signed)
-----   Message from [name], NP sent at 10/06/2022  9:21 AM EDT ----- Xrays show significant degenerative changes in the lumbar and thoracic spine as well as osteoarthritis in both hips.  Incidentally, there is also a calcified gallstone and multiple kidney stones bilaterally. It is possible the back pain is from the degenerative changes of the spine but the pain may be due to multiple kidney stones.

## 2022-10-07 ENCOUNTER — Encounter: Payer: Self-pay | Admitting: Nurse Practitioner

## 2022-10-07 ENCOUNTER — Telehealth: Payer: Self-pay | Admitting: Nurse Practitioner

## 2022-10-07 NOTE — Addendum Note (Signed)
Addended by: [name] on: 10/07/2022 02:45 PM   Modules accepted: Orders

## 2022-10-07 NOTE — Telephone Encounter (Signed)
Notified patient that urology referral was sent, gave her telephone # for [facility]

## 2022-10-13 DIAGNOSIS — H353221 Exudative age-related macular degeneration, left eye, with active choroidal neovascularization: Secondary | ICD-10-CM | POA: Diagnosis not present

## 2022-10-16 ENCOUNTER — Ambulatory Visit (INDEPENDENT_AMBULATORY_CARE_PROVIDER_SITE_OTHER): Payer: PPO | Admitting: Urology

## 2022-10-16 ENCOUNTER — Encounter: Payer: Self-pay | Admitting: Urology

## 2022-10-16 VITALS — BP 118/70 | HR 64 | Ht 64.0 in | Wt 136.0 lb

## 2022-10-16 DIAGNOSIS — M549 Dorsalgia, unspecified: Secondary | ICD-10-CM

## 2022-10-16 DIAGNOSIS — N200 Calculus of kidney: Secondary | ICD-10-CM | POA: Diagnosis not present

## 2022-10-16 LAB — URINALYSIS, COMPLETE
Bilirubin, UA: NEGATIVE
Glucose, UA: NEGATIVE
Ketones, UA: NEGATIVE
Leukocytes,UA: NEGATIVE
Nitrite, UA: NEGATIVE
Protein,UA: NEGATIVE
Specific Gravity, UA: <1.005 — ABNORMAL LOW (ref 1.005–1.030)
Urobilinogen, Ur: 0.2 mg/dL (ref 0.2–1.0)
pH, UA: 5.5 (ref 5.0–7.5)

## 2022-10-16 LAB — MICROSCOPIC EXAMINATION

## 2022-10-16 NOTE — Progress Notes (Unsigned)
10/16/2022 11:41 AM   Pauline Aus Jun 25, 1946 [id number]  Referring provider: [name], NP [place],  [place] [address]  Chief Complaint  Patient presents with   Nephrolithiasis    HPI: Sherry Oneal is a 76 y.o. female referred for nephrolithiasis.  Recently developed pain along the right costal margin radiating to the center back.  Pain worse with movement, standing Lumbar spine series with incidental renal calculi CT abdomen/pelvis without contrast April 2021 showed multiple bilateral renal calcification which appeared to be parenchymal and not in the collecting system.  Several lower associated with areas of cortical thinning. No bothersome LUTS.  Denies gross hematuria. Currently undergoing treatment for recurrent multiple myeloma   PMH: Past Medical History:  Diagnosis Date   Anemia    Arthritis    lower back   High blood pressure    High cholesterol    History of actinic keratosis    Macular degeneration    recieving 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    Multiple myeloma (HCC)    Renal insufficiency    Wears dentures    partial upper    Surgical History: Past Surgical History:  Procedure Laterality Date   CATARACT EXTRACTION W/PHACO Right 08/17/2018   Procedure: CATARACT EXTRACTION PHACO AND INTRAOCULAR LENS PLACEMENT ([place])  RIGHT;  Surgeon: Leandrew Koyanagi, MD;  Location: [facility];  Service: Ophthalmology;  Laterality: Right;   CATARACT EXTRACTION W/PHACO Left 11/09/2018   Procedure: CATARACT EXTRACTION PHACO AND INTRAOCULAR LENS PLACEMENT ([place]) LEFT;  Surgeon: Leandrew Koyanagi, MD;  Location: [facility];  Service: Ophthalmology;  Laterality: Left;   EXPLORATORY LAPAROTOMY      Home Medications:  Allergies as of 10/16/2022       Reactions   Aspirin Other (See Comments)   Causes internal bleeding per patient   Atenolol    "wierd feeling"   Doxycycline    Upset stomach    Epinephrine    "Shaking"   Ivp Dye [iodinated Contrast Media] Hypertension   Per tech conversation with PT, pt states that the last time she had IV dye she had increased BP and tachycardia.      Ketorolac    Nsaids    Bleeding   Penicillins    rash   Sulfa Antibiotics    Rash   Tolmetin    Bleeding   Tramadol    Numb feeling   Procaine Palpitations   States this was added due to palpitations and shakes after receiving local anesthetic containing epinephrine.        Medication List        Accurate as of October 16, 2022 11:41 AM. If you have any questions, ask your nurse or doctor.          acetaminophen 500 MG tablet Commonly known as: TYLENOL Take 650 mg by mouth in the morning and at bedtime.   ALPRAZolam 0.25 MG tablet Commonly known as: XANAX Take 1 tablet (0.25 mg total) by mouth 2 (two) times daily as needed for anxiety or sleep.   atorvastatin 10 MG tablet Commonly known as: LIPITOR Take 1 tablet by mouth once daily   Calcium Carbonate-Vitamin D 600-400 MG-UNIT tablet Take 1 tablet by mouth daily.   Cranberry 500 MG Caps Take 500 mg by mouth every morning.   cyclobenzaprine 5 MG tablet Commonly known as: FLEXERIL Take 1 tablet (5 mg total) by mouth at bedtime as needed for muscle spasms.   DARATUMUMAB  IV Inject into the vein every 30 (thirty) days.   dicyclomine 10 MG capsule Commonly known as: BENTYL TAKE 1 CAPSULE BY MOUTH 3 TIMES DAILY AS NEEDED FOR SPASMS. TAKE WITH MEALS.   diphenhydrAMINE 25 MG tablet Commonly known as: BENADRYL Take 25 mg by mouth daily as needed.   docusate 50 MG/5ML liquid Commonly known as: COLACE Take by mouth daily as needed for mild constipation.   Eliquis 5 MG Tabs tablet Generic drug: apixaban Take 1 tablet by mouth 2 (two) times daily.   fluticasone 50 MCG/ACT nasal spray Commonly known as: FLONASE Place 2 sprays into both nostrils daily.   ipratropium-albuterol 0.5-2.5 (3) MG/3ML Soln Commonly known  as: DUONEB Take 3 mLs by nebulization every 6 (six) hours as needed.   levofloxacin 500 MG tablet Commonly known as: LEVAQUIN Take 1 tablet (500 mg total) by mouth daily.   lisinopril 10 MG tablet Commonly known as: ZESTRIL Take 1 tablet (10 mg total) by mouth daily.   meclizine 25 MG tablet Commonly known as: ANTIVERT Take 25 mg by mouth as needed for dizziness.   omeprazole 20 MG capsule Commonly known as: PRILOSEC Take 20 mg by mouth daily.   ondansetron 4 MG disintegrating tablet Commonly known as: ZOFRAN-ODT DISSOLVE 1 TABLET IN MOUTH EVERY 8 HOURS AS NEEDED FOR NAUSEA   polyethylene glycol 17 g packet Commonly known as: MIRALAX / GLYCOLAX Take 17 g by mouth daily as needed.   pomalidomide 4 MG capsule Commonly known as: POMALYST Take 1 capsule by mouth once daily for days 1-21. 7 days rest.   predniSONE 10 MG tablet Commonly known as: DELTASONE Take one tab 3 x day for 3 days, then take one tab 2 x a day for 3 days and then take one tab a day for 3 days for copd   PRESERVISION AREDS PO Take 1 tablet by mouth every morning.   prochlorperazine 5 MG tablet Commonly known as: COMPAZINE Take 5 mg by mouth every 8 (eight) hours as needed.   promethazine 25 MG tablet Commonly known as: PHENERGAN Take 1 tablet (25 mg total) by mouth every 6 (six) hours as needed for nausea or vomiting.   senna 8.6 MG tablet Commonly known as: SENOKOT Take 1 tablet by mouth daily as needed for constipation.   valACYclovir 500 MG tablet Commonly known as: VALTREX Take 500 mg by mouth daily.   vitamin B-12 500 MCG tablet Commonly known as: CYANOCOBALAMIN Take 500 mcg by mouth daily.        Allergies:  Allergies  Allergen Reactions   Aspirin Other (See Comments)    Causes internal bleeding per patient   Atenolol     "wierd feeling"   Doxycycline     Upset stomach    Epinephrine     "Shaking"   Ivp Dye [Iodinated Contrast Media] Hypertension    Per tech conversation  with PT, pt states that the last time she had IV dye she had increased BP and tachycardia.      Ketorolac    Nsaids     Bleeding   Penicillins     rash   Sulfa Antibiotics     Rash    Tolmetin     Bleeding   Tramadol     Numb feeling    Procaine Palpitations    States this was added due to palpitations and shakes after receiving local anesthetic containing epinephrine.     Family History: Family History  Problem Relation Age of  Onset   Breast cancer Maternal Grandmother 40   CAD Mother    CVA Mother     Social History:  reports that she has never smoked. She has never used smokeless tobacco. She reports that she does not drink alcohol and does not use drugs.   Physical Exam: BP 118/70   Pulse 64   Ht _0  (1.626 m)   Wt 136 lb (61.7 kg)   BMI 23.34 kg/m   Constitutional:  Alert and oriented, No acute distress. HEENT: [place] AT Cardiovascular: No clubbing, cyanosis, or edema. Respiratory: Normal respiratory effort, no increased work of breathing. Psychiatric: Normal mood and affect.  Laboratory Data:  Urinalysis UA pending   Pertinent Imaging: CT 2021 and lumbar spine images were personally reviewed and interpreted ***  Assessment & Plan:    1.  Bilateral nephrolithiasis Unlikely pain secondary to a ureteral calculus Schedule stone protocol CT abdomen pelvis and will call with results ***Order  Abbie Sons, MD  [facility] [address], [place], [place] [address] [phone]

## 2022-10-19 ENCOUNTER — Telehealth: Payer: Self-pay | Admitting: Urology

## 2022-10-19 ENCOUNTER — Encounter: Payer: Self-pay | Admitting: Urology

## 2022-10-19 NOTE — Telephone Encounter (Signed)
PT called asking about CT scan.  She hasn't heard anthing from scheduling.  I didn't see order in.

## 2022-10-20 ENCOUNTER — Ambulatory Visit
Admission: RE | Admit: 2022-10-20 | Discharge: 2022-10-20 | Disposition: A | Discharge status: Home / Self Care | Payer: PPO | Source: Ambulatory Visit | Attending: Urology | Admitting: Urology

## 2022-10-20 ENCOUNTER — Other Ambulatory Visit: Payer: Self-pay | Admitting: Nurse Practitioner

## 2022-10-20 DIAGNOSIS — M549 Dorsalgia, unspecified: Secondary | ICD-10-CM | POA: Insufficient documentation

## 2022-10-20 DIAGNOSIS — N2889 Other specified disorders of kidney and ureter: Secondary | ICD-10-CM | POA: Diagnosis not present

## 2022-10-20 DIAGNOSIS — N200 Calculus of kidney: Secondary | ICD-10-CM | POA: Diagnosis not present

## 2022-10-20 DIAGNOSIS — M545 Low back pain, unspecified: Secondary | ICD-10-CM

## 2022-10-20 DIAGNOSIS — R109 Unspecified abdominal pain: Secondary | ICD-10-CM | POA: Diagnosis not present

## 2022-10-20 DIAGNOSIS — C9002 Multiple myeloma in relapse: Secondary | ICD-10-CM | POA: Diagnosis not present

## 2022-10-20 DIAGNOSIS — R0609 Other forms of dyspnea: Secondary | ICD-10-CM | POA: Diagnosis not present

## 2022-10-20 DIAGNOSIS — Z8579 Personal history of other malignant neoplasms of lymphoid, hematopoietic and related tissues: Secondary | ICD-10-CM | POA: Diagnosis not present

## 2022-10-20 DIAGNOSIS — Z87442 Personal history of urinary calculi: Secondary | ICD-10-CM | POA: Diagnosis not present

## 2022-10-20 DIAGNOSIS — Z5112 Encounter for antineoplastic immunotherapy: Secondary | ICD-10-CM | POA: Diagnosis not present

## 2022-10-21 ENCOUNTER — Other Ambulatory Visit: Payer: Self-pay

## 2022-10-21 DIAGNOSIS — M545 Low back pain, unspecified: Secondary | ICD-10-CM

## 2022-10-21 MED ORDER — CYCLOBENZAPRINE HCL 5 MG PO TABS: 5.0000 mg | ORAL_TABLET | Freq: Two times a day (BID) | ORAL | 1 refills | Status: DC | PRN

## 2022-10-21 NOTE — Telephone Encounter (Signed)
Pt called that her nephrology told her take cyclobenzaprine twice a daily as per alyssa change direction and send to phar

## 2022-10-22 ENCOUNTER — Encounter: Payer: Self-pay | Admitting: *Deleted

## 2022-10-27 DIAGNOSIS — Z79899 Other long term (current) drug therapy: Secondary | ICD-10-CM | POA: Diagnosis not present

## 2022-10-27 DIAGNOSIS — T451X5A Adverse effect of antineoplastic and immunosuppressive drugs, initial encounter: Secondary | ICD-10-CM | POA: Diagnosis not present

## 2022-10-27 DIAGNOSIS — D701 Agranulocytosis secondary to cancer chemotherapy: Secondary | ICD-10-CM | POA: Diagnosis not present

## 2022-10-27 DIAGNOSIS — Z23 Encounter for immunization: Secondary | ICD-10-CM | POA: Diagnosis not present

## 2022-10-27 DIAGNOSIS — Z9221 Personal history of antineoplastic chemotherapy: Secondary | ICD-10-CM | POA: Diagnosis not present

## 2022-10-27 DIAGNOSIS — E538 Deficiency of other specified B group vitamins: Secondary | ICD-10-CM | POA: Diagnosis not present

## 2022-10-27 DIAGNOSIS — R0789 Other chest pain: Secondary | ICD-10-CM | POA: Diagnosis not present

## 2022-10-27 DIAGNOSIS — C9002 Multiple myeloma in relapse: Secondary | ICD-10-CM | POA: Diagnosis not present

## 2022-10-27 DIAGNOSIS — H3530 Unspecified macular degeneration: Secondary | ICD-10-CM | POA: Diagnosis not present

## 2022-10-27 DIAGNOSIS — E041 Nontoxic single thyroid nodule: Secondary | ICD-10-CM | POA: Diagnosis not present

## 2022-10-27 DIAGNOSIS — G4700 Insomnia, unspecified: Secondary | ICD-10-CM | POA: Diagnosis not present

## 2022-10-27 DIAGNOSIS — S2249XA Multiple fractures of ribs, unspecified side, initial encounter for closed fracture: Secondary | ICD-10-CM | POA: Diagnosis not present

## 2022-10-27 DIAGNOSIS — M8588 Other specified disorders of bone density and structure, other site: Secondary | ICD-10-CM | POA: Diagnosis not present

## 2022-10-27 DIAGNOSIS — M549 Dorsalgia, unspecified: Secondary | ICD-10-CM | POA: Diagnosis not present

## 2022-10-30 DIAGNOSIS — M47816 Spondylosis without myelopathy or radiculopathy, lumbar region: Secondary | ICD-10-CM | POA: Diagnosis not present

## 2022-10-30 DIAGNOSIS — C9002 Multiple myeloma in relapse: Secondary | ICD-10-CM | POA: Diagnosis not present

## 2022-10-30 DIAGNOSIS — M47814 Spondylosis without myelopathy or radiculopathy, thoracic region: Secondary | ICD-10-CM | POA: Diagnosis not present

## 2022-10-30 DIAGNOSIS — Z5111 Encounter for antineoplastic chemotherapy: Secondary | ICD-10-CM | POA: Diagnosis not present

## 2022-11-04 DIAGNOSIS — Z6823 Body mass index (BMI) 23.0-23.9, adult: Secondary | ICD-10-CM | POA: Diagnosis not present

## 2022-11-04 DIAGNOSIS — M84452A Pathological fracture, left femur, initial encounter for fracture: Secondary | ICD-10-CM | POA: Diagnosis not present

## 2022-11-04 DIAGNOSIS — E538 Deficiency of other specified B group vitamins: Secondary | ICD-10-CM | POA: Diagnosis not present

## 2022-11-04 DIAGNOSIS — M898X9 Other specified disorders of bone, unspecified site: Secondary | ICD-10-CM | POA: Diagnosis not present

## 2022-11-04 DIAGNOSIS — G893 Neoplasm related pain (acute) (chronic): Secondary | ICD-10-CM | POA: Diagnosis not present

## 2022-11-04 DIAGNOSIS — F419 Anxiety disorder, unspecified: Secondary | ICD-10-CM | POA: Diagnosis not present

## 2022-11-04 DIAGNOSIS — M1612 Unilateral primary osteoarthritis, left hip: Secondary | ICD-10-CM | POA: Diagnosis not present

## 2022-11-04 DIAGNOSIS — N390 Urinary tract infection, site not specified: Secondary | ICD-10-CM | POA: Diagnosis not present

## 2022-11-04 DIAGNOSIS — N179 Acute kidney failure, unspecified: Secondary | ICD-10-CM | POA: Diagnosis not present

## 2022-11-04 DIAGNOSIS — C9000 Multiple myeloma not having achieved remission: Secondary | ICD-10-CM | POA: Diagnosis not present

## 2022-11-04 DIAGNOSIS — K219 Gastro-esophageal reflux disease without esophagitis: Secondary | ICD-10-CM | POA: Diagnosis not present

## 2022-11-04 DIAGNOSIS — S76012A Strain of muscle, fascia and tendon of left hip, initial encounter: Secondary | ICD-10-CM | POA: Diagnosis not present

## 2022-11-04 DIAGNOSIS — M898X8 Other specified disorders of bone, other site: Secondary | ICD-10-CM | POA: Diagnosis not present

## 2022-11-04 DIAGNOSIS — E785 Hyperlipidemia, unspecified: Secondary | ICD-10-CM | POA: Diagnosis not present

## 2022-11-04 DIAGNOSIS — M8458XA Pathological fracture in neoplastic disease, other specified site, initial encounter for fracture: Secondary | ICD-10-CM | POA: Diagnosis not present

## 2022-11-04 DIAGNOSIS — M25551 Pain in right hip: Secondary | ICD-10-CM | POA: Diagnosis not present

## 2022-11-04 DIAGNOSIS — M549 Dorsalgia, unspecified: Secondary | ICD-10-CM | POA: Diagnosis not present

## 2022-11-04 DIAGNOSIS — R5381 Other malaise: Secondary | ICD-10-CM | POA: Diagnosis not present

## 2022-11-04 DIAGNOSIS — R936 Abnormal findings on diagnostic imaging of limbs: Secondary | ICD-10-CM | POA: Diagnosis not present

## 2022-11-04 DIAGNOSIS — R937 Abnormal findings on diagnostic imaging of other parts of musculoskeletal system: Secondary | ICD-10-CM | POA: Diagnosis not present

## 2022-11-04 DIAGNOSIS — Z880 Allergy status to penicillin: Secondary | ICD-10-CM | POA: Diagnosis not present

## 2022-11-04 DIAGNOSIS — M25552 Pain in left hip: Secondary | ICD-10-CM | POA: Diagnosis not present

## 2022-11-04 DIAGNOSIS — I10 Essential (primary) hypertension: Secondary | ICD-10-CM | POA: Diagnosis not present

## 2022-11-04 DIAGNOSIS — E440 Moderate protein-calorie malnutrition: Secondary | ICD-10-CM | POA: Diagnosis not present

## 2022-11-04 DIAGNOSIS — Z882 Allergy status to sulfonamides status: Secondary | ICD-10-CM | POA: Diagnosis not present

## 2022-11-04 DIAGNOSIS — M7602 Gluteal tendinitis, left hip: Secondary | ICD-10-CM | POA: Diagnosis not present

## 2022-11-04 DIAGNOSIS — C9002 Multiple myeloma in relapse: Secondary | ICD-10-CM | POA: Diagnosis not present

## 2022-11-04 DIAGNOSIS — M8589 Other specified disorders of bone density and structure, multiple sites: Secondary | ICD-10-CM | POA: Diagnosis not present

## 2022-11-04 DIAGNOSIS — Z5111 Encounter for antineoplastic chemotherapy: Secondary | ICD-10-CM | POA: Diagnosis not present

## 2022-11-04 DIAGNOSIS — D84821 Immunodeficiency due to drugs: Secondary | ICD-10-CM | POA: Diagnosis not present

## 2022-11-04 DIAGNOSIS — D61810 Antineoplastic chemotherapy induced pancytopenia: Secondary | ICD-10-CM | POA: Diagnosis not present

## 2022-11-04 DIAGNOSIS — R0781 Pleurodynia: Secondary | ICD-10-CM | POA: Diagnosis not present

## 2022-11-04 DIAGNOSIS — M545 Low back pain, unspecified: Secondary | ICD-10-CM | POA: Diagnosis not present

## 2022-11-04 DIAGNOSIS — K5900 Constipation, unspecified: Secondary | ICD-10-CM | POA: Diagnosis not present

## 2022-11-04 DIAGNOSIS — Z049 Encounter for examination and observation for unspecified reason: Secondary | ICD-10-CM | POA: Diagnosis not present

## 2022-11-12 ENCOUNTER — Ambulatory Visit: Payer: PPO | Admitting: Nurse Practitioner

## 2022-11-12 ENCOUNTER — Telehealth: Payer: Self-pay

## 2022-11-12 NOTE — Telephone Encounter (Signed)
Called patient to schedule HF from [facility]. She stated she has follow up appointments with other providers. Will call us if needed-Toni

## 2022-11-13 DIAGNOSIS — I10 Essential (primary) hypertension: Secondary | ICD-10-CM | POA: Diagnosis not present

## 2022-11-13 DIAGNOSIS — C9000 Multiple myeloma not having achieved remission: Secondary | ICD-10-CM | POA: Diagnosis not present

## 2022-11-13 DIAGNOSIS — H3530 Unspecified macular degeneration: Secondary | ICD-10-CM | POA: Diagnosis not present

## 2022-11-13 DIAGNOSIS — M84452A Pathological fracture, left femur, initial encounter for fracture: Secondary | ICD-10-CM | POA: Diagnosis not present

## 2022-11-13 DIAGNOSIS — M7602 Gluteal tendinitis, left hip: Secondary | ICD-10-CM | POA: Diagnosis not present

## 2022-11-13 DIAGNOSIS — E785 Hyperlipidemia, unspecified: Secondary | ICD-10-CM | POA: Diagnosis not present

## 2022-11-14 DIAGNOSIS — N390 Urinary tract infection, site not specified: Secondary | ICD-10-CM | POA: Diagnosis not present

## 2022-11-14 DIAGNOSIS — N3289 Other specified disorders of bladder: Secondary | ICD-10-CM | POA: Diagnosis not present

## 2022-11-14 DIAGNOSIS — M84552D Pathological fracture in neoplastic disease, left femur, subsequent encounter for fracture with routine healing: Secondary | ICD-10-CM | POA: Diagnosis not present

## 2022-11-14 DIAGNOSIS — H3530 Unspecified macular degeneration: Secondary | ICD-10-CM | POA: Diagnosis not present

## 2022-11-14 DIAGNOSIS — N189 Chronic kidney disease, unspecified: Secondary | ICD-10-CM | POA: Diagnosis not present

## 2022-11-14 DIAGNOSIS — C9030 Solitary plasmacytoma not having achieved remission: Secondary | ICD-10-CM | POA: Diagnosis not present

## 2022-11-14 DIAGNOSIS — K219 Gastro-esophageal reflux disease without esophagitis: Secondary | ICD-10-CM | POA: Diagnosis not present

## 2022-11-14 DIAGNOSIS — E041 Nontoxic single thyroid nodule: Secondary | ICD-10-CM | POA: Diagnosis not present

## 2022-11-14 DIAGNOSIS — C9002 Multiple myeloma in relapse: Secondary | ICD-10-CM | POA: Diagnosis not present

## 2022-11-14 DIAGNOSIS — D61810 Antineoplastic chemotherapy induced pancytopenia: Secondary | ICD-10-CM | POA: Diagnosis not present

## 2022-11-14 DIAGNOSIS — M8588 Other specified disorders of bone density and structure, other site: Secondary | ICD-10-CM | POA: Diagnosis not present

## 2022-11-14 DIAGNOSIS — D801 Nonfamilial hypogammaglobulinemia: Secondary | ICD-10-CM | POA: Diagnosis not present

## 2022-11-14 DIAGNOSIS — M8458XD Pathological fracture in neoplastic disease, other specified site, subsequent encounter for fracture with routine healing: Secondary | ICD-10-CM | POA: Diagnosis not present

## 2022-11-14 DIAGNOSIS — E785 Hyperlipidemia, unspecified: Secondary | ICD-10-CM | POA: Diagnosis not present

## 2022-11-14 DIAGNOSIS — I129 Hypertensive chronic kidney disease with stage 1 through stage 4 chronic kidney disease, or unspecified chronic kidney disease: Secondary | ICD-10-CM | POA: Diagnosis not present

## 2022-11-14 DIAGNOSIS — D631 Anemia in chronic kidney disease: Secondary | ICD-10-CM | POA: Diagnosis not present

## 2022-11-14 DIAGNOSIS — T451X5D Adverse effect of antineoplastic and immunosuppressive drugs, subsequent encounter: Secondary | ICD-10-CM | POA: Diagnosis not present

## 2022-11-14 DIAGNOSIS — Z9181 History of falling: Secondary | ICD-10-CM | POA: Diagnosis not present

## 2022-11-14 DIAGNOSIS — E538 Deficiency of other specified B group vitamins: Secondary | ICD-10-CM | POA: Diagnosis not present

## 2022-11-14 DIAGNOSIS — D630 Anemia in neoplastic disease: Secondary | ICD-10-CM | POA: Diagnosis not present

## 2022-11-14 DIAGNOSIS — D701 Agranulocytosis secondary to cancer chemotherapy: Secondary | ICD-10-CM | POA: Diagnosis not present

## 2022-11-14 DIAGNOSIS — F411 Generalized anxiety disorder: Secondary | ICD-10-CM | POA: Diagnosis not present

## 2022-11-14 DIAGNOSIS — G893 Neoplasm related pain (acute) (chronic): Secondary | ICD-10-CM | POA: Diagnosis not present

## 2022-11-14 DIAGNOSIS — N200 Calculus of kidney: Secondary | ICD-10-CM | POA: Diagnosis not present

## 2022-11-17 DIAGNOSIS — C9000 Multiple myeloma not having achieved remission: Secondary | ICD-10-CM | POA: Diagnosis not present

## 2022-11-23 ENCOUNTER — Telehealth: Payer: Self-pay

## 2022-11-23 DIAGNOSIS — H353221 Exudative age-related macular degeneration, left eye, with active choroidal neovascularization: Secondary | ICD-10-CM | POA: Diagnosis not present

## 2022-11-23 NOTE — Telephone Encounter (Signed)
Received 11/14/22 home health order. Gave to dfk for signature-Toni

## 2022-11-24 ENCOUNTER — Telehealth: Payer: Self-pay

## 2022-11-24 DIAGNOSIS — C9002 Multiple myeloma in relapse: Secondary | ICD-10-CM | POA: Diagnosis not present

## 2022-11-24 NOTE — Telephone Encounter (Signed)
11/14/22 home health order signed. Faxed back to [place]; [phone]

## 2022-11-25 ENCOUNTER — Ambulatory Visit: Payer: PPO | Admitting: Nurse Practitioner

## 2022-11-25 DIAGNOSIS — Z79891 Long term (current) use of opiate analgesic: Secondary | ICD-10-CM | POA: Diagnosis not present

## 2022-11-25 DIAGNOSIS — H3530 Unspecified macular degeneration: Secondary | ICD-10-CM | POA: Diagnosis not present

## 2022-11-25 DIAGNOSIS — F32A Depression, unspecified: Secondary | ICD-10-CM | POA: Diagnosis not present

## 2022-11-25 DIAGNOSIS — R0789 Other chest pain: Secondary | ICD-10-CM | POA: Diagnosis not present

## 2022-11-25 DIAGNOSIS — D61810 Antineoplastic chemotherapy induced pancytopenia: Secondary | ICD-10-CM | POA: Diagnosis not present

## 2022-11-25 DIAGNOSIS — Z5111 Encounter for antineoplastic chemotherapy: Secondary | ICD-10-CM | POA: Diagnosis not present

## 2022-11-25 DIAGNOSIS — R0609 Other forms of dyspnea: Secondary | ICD-10-CM | POA: Diagnosis not present

## 2022-11-25 DIAGNOSIS — D801 Nonfamilial hypogammaglobulinemia: Secondary | ICD-10-CM | POA: Diagnosis not present

## 2022-11-25 DIAGNOSIS — Z79899 Other long term (current) drug therapy: Secondary | ICD-10-CM | POA: Diagnosis not present

## 2022-11-25 DIAGNOSIS — T451X5A Adverse effect of antineoplastic and immunosuppressive drugs, initial encounter: Secondary | ICD-10-CM | POA: Diagnosis not present

## 2022-11-25 DIAGNOSIS — D701 Agranulocytosis secondary to cancer chemotherapy: Secondary | ICD-10-CM | POA: Diagnosis not present

## 2022-11-25 DIAGNOSIS — Z923 Personal history of irradiation: Secondary | ICD-10-CM | POA: Diagnosis not present

## 2022-11-25 DIAGNOSIS — C9002 Multiple myeloma in relapse: Secondary | ICD-10-CM | POA: Diagnosis not present

## 2022-11-25 DIAGNOSIS — E041 Nontoxic single thyroid nodule: Secondary | ICD-10-CM | POA: Diagnosis not present

## 2022-11-25 DIAGNOSIS — F419 Anxiety disorder, unspecified: Secondary | ICD-10-CM | POA: Diagnosis not present

## 2022-11-26 ENCOUNTER — Encounter: Payer: PPO | Admitting: Dermatology

## 2022-11-27 DIAGNOSIS — Z9181 History of falling: Secondary | ICD-10-CM | POA: Diagnosis not present

## 2022-11-27 DIAGNOSIS — K219 Gastro-esophageal reflux disease without esophagitis: Secondary | ICD-10-CM | POA: Diagnosis not present

## 2022-11-27 DIAGNOSIS — F411 Generalized anxiety disorder: Secondary | ICD-10-CM | POA: Diagnosis not present

## 2022-11-27 DIAGNOSIS — M8588 Other specified disorders of bone density and structure, other site: Secondary | ICD-10-CM | POA: Diagnosis not present

## 2022-11-27 DIAGNOSIS — D61810 Antineoplastic chemotherapy induced pancytopenia: Secondary | ICD-10-CM | POA: Diagnosis not present

## 2022-11-27 DIAGNOSIS — G893 Neoplasm related pain (acute) (chronic): Secondary | ICD-10-CM | POA: Diagnosis not present

## 2022-11-27 DIAGNOSIS — N200 Calculus of kidney: Secondary | ICD-10-CM | POA: Diagnosis not present

## 2022-11-27 DIAGNOSIS — N390 Urinary tract infection, site not specified: Secondary | ICD-10-CM | POA: Diagnosis not present

## 2022-11-27 DIAGNOSIS — H3530 Unspecified macular degeneration: Secondary | ICD-10-CM | POA: Diagnosis not present

## 2022-11-27 DIAGNOSIS — I129 Hypertensive chronic kidney disease with stage 1 through stage 4 chronic kidney disease, or unspecified chronic kidney disease: Secondary | ICD-10-CM | POA: Diagnosis not present

## 2022-11-27 DIAGNOSIS — E538 Deficiency of other specified B group vitamins: Secondary | ICD-10-CM | POA: Diagnosis not present

## 2022-11-27 DIAGNOSIS — D801 Nonfamilial hypogammaglobulinemia: Secondary | ICD-10-CM | POA: Diagnosis not present

## 2022-11-27 DIAGNOSIS — M8458XD Pathological fracture in neoplastic disease, other specified site, subsequent encounter for fracture with routine healing: Secondary | ICD-10-CM | POA: Diagnosis not present

## 2022-11-27 DIAGNOSIS — T451X5D Adverse effect of antineoplastic and immunosuppressive drugs, subsequent encounter: Secondary | ICD-10-CM | POA: Diagnosis not present

## 2022-11-27 DIAGNOSIS — E785 Hyperlipidemia, unspecified: Secondary | ICD-10-CM | POA: Diagnosis not present

## 2022-11-27 DIAGNOSIS — C9030 Solitary plasmacytoma not having achieved remission: Secondary | ICD-10-CM | POA: Diagnosis not present

## 2022-11-27 DIAGNOSIS — E041 Nontoxic single thyroid nodule: Secondary | ICD-10-CM | POA: Diagnosis not present

## 2022-11-27 DIAGNOSIS — D701 Agranulocytosis secondary to cancer chemotherapy: Secondary | ICD-10-CM | POA: Diagnosis not present

## 2022-11-27 DIAGNOSIS — N189 Chronic kidney disease, unspecified: Secondary | ICD-10-CM | POA: Diagnosis not present

## 2022-11-27 DIAGNOSIS — N3289 Other specified disorders of bladder: Secondary | ICD-10-CM | POA: Diagnosis not present

## 2022-11-27 DIAGNOSIS — D630 Anemia in neoplastic disease: Secondary | ICD-10-CM | POA: Diagnosis not present

## 2022-11-27 DIAGNOSIS — D631 Anemia in chronic kidney disease: Secondary | ICD-10-CM | POA: Diagnosis not present

## 2022-11-27 DIAGNOSIS — M84552D Pathological fracture in neoplastic disease, left femur, subsequent encounter for fracture with routine healing: Secondary | ICD-10-CM | POA: Diagnosis not present

## 2022-11-27 DIAGNOSIS — C9002 Multiple myeloma in relapse: Secondary | ICD-10-CM | POA: Diagnosis not present

## 2022-11-30 DIAGNOSIS — R0789 Other chest pain: Secondary | ICD-10-CM | POA: Diagnosis not present

## 2022-11-30 DIAGNOSIS — C9002 Multiple myeloma in relapse: Secondary | ICD-10-CM | POA: Diagnosis not present

## 2022-11-30 DIAGNOSIS — R0609 Other forms of dyspnea: Secondary | ICD-10-CM | POA: Diagnosis not present

## 2022-11-30 DIAGNOSIS — Z5111 Encounter for antineoplastic chemotherapy: Secondary | ICD-10-CM | POA: Diagnosis not present

## 2022-12-01 DIAGNOSIS — Z5111 Encounter for antineoplastic chemotherapy: Secondary | ICD-10-CM | POA: Diagnosis not present

## 2022-12-01 DIAGNOSIS — C9002 Multiple myeloma in relapse: Secondary | ICD-10-CM | POA: Diagnosis not present

## 2022-12-01 DIAGNOSIS — C9000 Multiple myeloma not having achieved remission: Secondary | ICD-10-CM | POA: Diagnosis not present

## 2022-12-01 DIAGNOSIS — M26653 Arthropathy of bilateral temporomandibular joint: Secondary | ICD-10-CM | POA: Diagnosis not present

## 2022-12-01 DIAGNOSIS — R0609 Other forms of dyspnea: Secondary | ICD-10-CM | POA: Diagnosis not present

## 2022-12-01 DIAGNOSIS — M898X9 Other specified disorders of bone, unspecified site: Secondary | ICD-10-CM | POA: Diagnosis not present

## 2022-12-01 DIAGNOSIS — R0789 Other chest pain: Secondary | ICD-10-CM | POA: Diagnosis not present

## 2022-12-01 DIAGNOSIS — M899 Disorder of bone, unspecified: Secondary | ICD-10-CM | POA: Diagnosis not present

## 2022-12-02 DIAGNOSIS — C9002 Multiple myeloma in relapse: Secondary | ICD-10-CM | POA: Diagnosis not present

## 2022-12-02 DIAGNOSIS — R0789 Other chest pain: Secondary | ICD-10-CM | POA: Diagnosis not present

## 2022-12-02 DIAGNOSIS — R0609 Other forms of dyspnea: Secondary | ICD-10-CM | POA: Diagnosis not present

## 2022-12-02 DIAGNOSIS — Z5111 Encounter for antineoplastic chemotherapy: Secondary | ICD-10-CM | POA: Diagnosis not present

## 2022-12-08 ENCOUNTER — Telehealth: Payer: Self-pay

## 2022-12-08 ENCOUNTER — Telehealth: Payer: Self-pay | Admitting: Nurse Practitioner

## 2022-12-08 NOTE — Telephone Encounter (Signed)
Left vm and sent mychart message to confirm 12/14/22 appointment-Toni 

## 2022-12-08 NOTE — Telephone Encounter (Signed)
Pt called that she spoke with [facility] that she is eligible for custodial care (home health aide ) send order to [facility] faxed [phone] and also spoke with [facility]  that we sending order

## 2022-12-09 DIAGNOSIS — R0609 Other forms of dyspnea: Secondary | ICD-10-CM | POA: Diagnosis not present

## 2022-12-09 DIAGNOSIS — R0789 Other chest pain: Secondary | ICD-10-CM | POA: Diagnosis not present

## 2022-12-09 DIAGNOSIS — C9002 Multiple myeloma in relapse: Secondary | ICD-10-CM | POA: Diagnosis not present

## 2022-12-09 DIAGNOSIS — Z5111 Encounter for antineoplastic chemotherapy: Secondary | ICD-10-CM | POA: Diagnosis not present

## 2022-12-10 ENCOUNTER — Telehealth: Payer: Self-pay

## 2022-12-10 NOTE — Telephone Encounter (Signed)
[facility] called [phone] lmom that they denied as per pt she talk to them and [facility] try to get again PA

## 2022-12-11 ENCOUNTER — Ambulatory Visit: Payer: PPO | Admitting: Nurse Practitioner

## 2022-12-14 ENCOUNTER — Encounter: Payer: Self-pay | Admitting: Nurse Practitioner

## 2022-12-14 ENCOUNTER — Ambulatory Visit (INDEPENDENT_AMBULATORY_CARE_PROVIDER_SITE_OTHER): Payer: PPO | Admitting: Nurse Practitioner

## 2022-12-14 VITALS — BP 136/62 | HR 82 | Temp 98.0°F | Resp 16 | Ht 64.0 in | Wt 125.0 lb

## 2022-12-14 DIAGNOSIS — J0140 Acute pansinusitis, unspecified: Secondary | ICD-10-CM | POA: Diagnosis not present

## 2022-12-14 DIAGNOSIS — C9000 Multiple myeloma not having achieved remission: Secondary | ICD-10-CM | POA: Diagnosis not present

## 2022-12-14 DIAGNOSIS — Z0001 Encounter for general adult medical examination with abnormal findings: Secondary | ICD-10-CM | POA: Diagnosis not present

## 2022-12-14 DIAGNOSIS — I10 Essential (primary) hypertension: Secondary | ICD-10-CM | POA: Diagnosis not present

## 2022-12-14 MED ORDER — LEVOFLOXACIN 500 MG PO TABS: 500.0000 mg | ORAL_TABLET | Freq: Every day | ORAL | 0 refills | Status: DC

## 2022-12-14 MED ORDER — DEXTROSE 5 % IV SOLN: INTRAVENOUS | Status: DC

## 2022-12-14 MED ORDER — CHLORHEXIDINE GLUCONATE 0.12 % MT SOLN: OROMUCOSAL | 0 refills | Status: DC

## 2022-12-14 MED ORDER — HYDROCOD POLI-CHLORPHE POLI ER 10-8 MG/5ML PO SUER: 5.0000 mL | Freq: Two times a day (BID) | ORAL | 0 refills | Status: DC | PRN

## 2022-12-14 NOTE — Progress Notes (Signed)
[facility] [place], [place] [address]  Internal MEDICINE  Office Visit Note  Patient Name: Sherry Oneal  [id number]  [id number]  Date of Service: 12/14/2022  Chief Complaint  Patient presents with   Medicare Wellness   Hyperlipidemia   Hypertension    HPI Sherry Oneal presents for an annual well visit and physical exam.  Well-appearing 76 y.o. female/female with hypertension, kidney stone, pancytopenia d/t chemo, multiple myeloma, left eye macular degeneration and GAD.  Routine mammogram: done in may 2023 Labs: routine labs done with oncologist New or worsening pain: chronic pain Other concerns: --has a cancerous lesions in back and hips and left leg -- had radiation and is doing chemo now Has respiratory infection.       12/14/2022    8:59 AM 12/12/2021    9:47 AM 11/28/2020    8:41 AM  MMSE - Mini Mental State Exam  Orientation to time _0 Orientation to Place _1 Registration _2 Attention/ Calculation _3 Recall _4 Language- name 2 objects _5 Language- repeat _6 Language- follow 3 step command _7 Language- read & follow direction _8 Write a sentence _9 Copy design _10 Total score _11 Functional Status Survey: Is the patient deaf or have difficulty hearing?: Yes Does the patient have difficulty seeing, even when wearing glasses/contacts?: No Does the patient have difficulty concentrating, remembering, or making decisions?: No Does the patient have difficulty walking or climbing stairs?: Yes Does the patient have difficulty dressing or bathing?: No Does the patient have difficulty doing errands alone such as visiting a doctor's office or shopping?: No     03/23/2021    6:16 PM 12/12/2021    9:45 AM 04/16/2022   10:30 AM 08/13/2022    8:27 AM 12/14/2022    8:57 AM  Fall Risk  Falls in the past year?  0 0 0 0  Was there an injury with Fall?     0  Fall Risk Category Calculator     0  Fall  Risk Category     Low  Patient Fall Risk Level Low fall risk Low fall risk Low fall risk  Low fall risk  Patient at Risk for Falls Due to  No Fall Risks No Fall Risks  No Fall Risks  Fall risk Follow up  Falls evaluation completed Falls evaluation completed  Falls evaluation completed       12/14/2022    8:57 AM  Depression screen PHQ 2/9  Decreased Interest 0  Down, Depressed, Hopeless 0  PHQ - 2 Score 0      Current Medication: Outpatient Encounter Medications as of 12/14/2022  Medication Sig Note   acetaminophen (TYLENOL) 500 MG tablet Take 650 mg by mouth in the morning and at bedtime.    ALPRAZolam (XANAX) 0.25 MG tablet Take 1 tablet (0.25 mg total) by mouth 2 (two) times daily as needed for anxiety or sleep.    atorvastatin (LIPITOR) 10 MG tablet Take 1 tablet by mouth once daily    Calcium Carbonate-Vitamin D 600-400 MG-UNIT tablet Take 1 tablet by mouth daily.    carfilzomib in dextrose 5 % 50 mL Getting IV chemo at [facility]    chlorhexidine (PERIDEX) 0.12 % solution Swish and spit 15 ml once or twice daily to  prevent oral/dental infections    chlorpheniramine-HYDROcodone (TUSSIONEX) 10-8 MG/5ML Take 5 mLs by mouth every 12 (twelve) hours as needed for cough.    Cranberry 500 MG CAPS Take 500 mg by mouth every morning.    dexamethasone (DECADRON) 4 MG tablet Take 20 mg by mouth daily. 12/14/2022: For chemo   dicyclomine (BENTYL) 10 MG capsule TAKE 1 CAPSULE BY MOUTH 3 TIMES DAILY AS NEEDED FOR SPASMS. TAKE WITH MEALS.    diphenhydrAMINE (BENADRYL) 25 MG tablet Take 25 mg by mouth daily as needed.    docusate (COLACE) 50 MG/5ML liquid Take by mouth daily as needed for mild constipation.    fluticasone (FLONASE) 50 MCG/ACT nasal spray Place 2 sprays into both nostrils daily.    ipratropium-albuterol (DUONEB) 0.5-2.5 (3) MG/3ML SOLN Take 3 mLs by nebulization every 6 (six) hours as needed.    meclizine (ANTIVERT) 25 MG tablet Take 25 mg by mouth as needed for  dizziness.    Multiple Vitamins-Minerals (PRESERVISION AREDS PO) Take 1 tablet by mouth every morning.    omeprazole (PRILOSEC) 20 MG capsule Take 20 mg by mouth daily.    ondansetron (ZOFRAN-ODT) 4 MG disintegrating tablet DISSOLVE 1 TABLET IN MOUTH EVERY 8 HOURS AS NEEDED FOR NAUSEA    oxyCODONE (OXY IR/ROXICODONE) 5 MG immediate release tablet Take 5 mg by mouth every 4 (four) hours as needed.    polyethylene glycol (MIRALAX / GLYCOLAX) packet Take 17 g by mouth daily as needed.    prochlorperazine (COMPAZINE) 5 MG tablet Take 5 mg by mouth every 8 (eight) hours as needed.    promethazine (PHENERGAN) 25 MG tablet Take 1 tablet (25 mg total) by mouth every 6 (six) hours as needed for nausea or vomiting.    senna (SENOKOT) 8.6 MG tablet Take 1 tablet by mouth daily as needed for constipation.    Sodium Fluoride (PREVIDENT 5000 BOOSTER PLUS) 1.1 % PSTE Place onto teeth.    valACYclovir (VALTREX) 500 MG tablet Take 500 mg by mouth daily.    vitamin B-12 (CYANOCOBALAMIN) 500 MCG tablet Take 500 mcg by mouth daily.    [DISCONTINUED] cyclobenzaprine (FLEXERIL) 5 MG tablet Take 1 tablet (5 mg total) by mouth 2 (two) times daily as needed for muscle spasms.    [DISCONTINUED] DARATUMUMAB IV Inject into the vein every 30 (thirty) days.    [DISCONTINUED] ELIQUIS 5 MG TABS tablet Take 1 tablet by mouth 2 (two) times daily.    [DISCONTINUED] levofloxacin (LEVAQUIN) 500 MG tablet Take 1 tablet (500 mg total) by mouth daily.    [DISCONTINUED] lisinopril (ZESTRIL) 10 MG tablet Take 1 tablet (10 mg total) by mouth daily.    [DISCONTINUED] pomalidomide (POMALYST) 4 MG capsule Take 1 capsule by mouth once daily for days 1-21. 7 days rest.    [DISCONTINUED] predniSONE (DELTASONE) 10 MG tablet Take one tab 3 x day for 3 days, then take one tab 2 x a day for 3 days and then take one tab a day for 3 days for copd    levofloxacin (LEVAQUIN) 500 MG tablet Take 1 tablet (500 mg total) by mouth daily.    No  facility-administered encounter medications on file as of 12/14/2022.    Surgical History: Past Surgical History:  Procedure Laterality Date   CATARACT EXTRACTION W/PHACO Right 08/17/2018   Procedure: CATARACT EXTRACTION PHACO AND INTRAOCULAR LENS PLACEMENT ([place])  RIGHT;  Surgeon: Leandrew Koyanagi, MD;  Location: [facility];  Service: Ophthalmology;  Laterality: Right;   CATARACT EXTRACTION W/PHACO Left  11/09/2018   Procedure: CATARACT EXTRACTION PHACO AND INTRAOCULAR LENS PLACEMENT ([place]) LEFT;  Surgeon: Leandrew Koyanagi, MD;  Location: [facility];  Service: Ophthalmology;  Laterality: Left;   EXPLORATORY LAPAROTOMY      Medical History: Past Medical History:  Diagnosis Date   Anemia    Arthritis    lower back   High blood pressure    High cholesterol    History of actinic keratosis    Macular degeneration    recieving 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    Multiple myeloma (HCC)    Renal insufficiency    Wears dentures    partial upper    Family History: Family History  Problem Relation Age of Onset   Breast cancer Maternal Grandmother 2   CAD Mother    CVA Mother     Social History   Socioeconomic History   Marital status: Married    Spouse name: Not on file   Number of children: 1   Years of education: Not on file   Highest education level: Not on file  Occupational History   Not on file  Tobacco Use   Smoking status: Never   Smokeless tobacco: Never  Vaping Use   Vaping Use: Never used  Substance and Sexual Activity   Alcohol use: No   Drug use: No   Sexual activity: Not Currently    Birth control/protection: None  Other Topics Concern   Not on file  Social History Narrative   Lives at home with her husband, independent at baseline   Social Determinants of Health   Financial Resource Strain: [place]  (04/16/2021)   Overall Financial Resource Strain (CARDIA)    Difficulty of Paying Living  Expenses: Not very hard  Food Insecurity: No Food Insecurity (03/22/2018)   Hunger Vital Sign    Worried About Running Out of Food in the Last Year: Never true    [place] in the Last Year: Never true  Transportation Needs: No Transportation Needs (03/22/2018)   PRAPARE - Hydrologist (Medical): No    Lack of Transportation (Non-Medical): No  Physical Activity: Inactive (03/22/2018)   Exercise Vital Sign    Days of Exercise per Week: 0 days    Minutes of Exercise per Session: 0 min  Stress: No Stress Concern Present (03/22/2018)   [place]    Feeling of Stress : Only a little  Social Connections: Moderately Integrated (03/22/2018)   Social Connection and Isolation Panel [NHANES]    Frequency of Communication with Friends and Family: More than three times a week    Frequency of Social Gatherings with Friends and Family: Three times a week    Attends Religious Services: 1 to 4 times per year    Active Member of Clubs or Organizations: No    Attends Archivist Meetings: Never    Marital Status: Married  Human resources officer Violence: Not At Risk (03/22/2018)   Humiliation, Afraid, Rape, and Kick questionnaire    Fear of Current or Ex-Partner: No    Emotionally Abused: No    Physically Abused: No    Sexually Abused: No      Review of Systems  Constitutional:  Positive for appetite change and fatigue. Negative for activity change, chills, fever and unexpected weight change.  HENT:  Positive for dental problem, sore throat and trouble swallowing. Negative for congestion, ear pain and  rhinorrhea.        Difficulty opening mouth  Eyes: Negative.   Respiratory:  Positive for cough, chest tightness, shortness of breath and wheezing.   Cardiovascular: Negative.  Negative for chest pain and palpitations.  Gastrointestinal:  Positive for nausea. Negative for abdominal pain, blood in  stool, constipation, diarrhea and vomiting.  Endocrine: Negative.   Genitourinary: Negative.  Negative for difficulty urinating, dysuria, frequency, hematuria and urgency.  Musculoskeletal: Negative.  Negative for arthralgias, back pain, joint swelling, myalgias and neck pain.  Skin: Negative.  Negative for rash and wound.  Allergic/Immunologic: Negative.  Negative for immunocompromised state.  Neurological: Negative.  Negative for dizziness, seizures, numbness and headaches.  Hematological: Negative.   Psychiatric/Behavioral: Negative.  Negative for behavioral problems, self-injury and suicidal ideas. The patient is not nervous/anxious.     Vital Signs: BP 136/62   Pulse 82   Temp 98 F (36.7 C)   Resp 16   Ht _0  (1.626 m)   Wt 125 lb (56.7 kg)   SpO2 99%   BMI 21.46 kg/m    Physical Exam Vitals reviewed.  Constitutional:      General: She is awake. She is not in acute distress.    Appearance: Normal appearance. She is well-developed and well-groomed. She is obese. She is not ill-appearing or diaphoretic.  HENT:     Head: Normocephalic and atraumatic.     Jaw: Tenderness, pain on movement and malocclusion present.     Right Ear: Tympanic membrane, ear canal and external ear normal.     Left Ear: Tympanic membrane, ear canal and external ear normal.     Nose: Nose normal. No congestion or rhinorrhea.     Mouth/Throat:     Lips: Pink.     Mouth: Mucous membranes are moist.     Pharynx: Oropharynx is clear. Uvula midline. No oropharyngeal exudate or posterior oropharyngeal erythema.  Eyes:     General: Lids are normal. Vision grossly intact. Gaze aligned appropriately. No scleral icterus.       Right eye: No discharge.        Left eye: No discharge.     Extraocular Movements: Extraocular movements intact.     Conjunctiva/sclera: Conjunctivae normal.     Pupils: Pupils are equal, round, and reactive to light.     Funduscopic exam:    Right eye: Red reflex present.         Left eye: Red reflex present. Neck:     Thyroid: No thyromegaly.     Vascular: No JVD.     Trachea: Trachea and phonation normal. No tracheal deviation.  Cardiovascular:     Rate and Rhythm: Normal rate and regular rhythm.     Pulses: Normal pulses.     Heart sounds: Normal heart sounds, S1 normal and S2 normal. No murmur heard.    No friction rub. No gallop.  Pulmonary:     Effort: Pulmonary effort is normal. Tachypnea present. No accessory muscle usage or respiratory distress.     Breath sounds: Normal air entry. No stridor. Examination of the right-upper field reveals wheezing. Examination of the left-upper field reveals wheezing. Examination of the right-middle field reveals wheezing. Examination of the left-middle field reveals wheezing. Examination of the right-lower field reveals decreased breath sounds. Examination of the left-lower field reveals decreased breath sounds. Decreased breath sounds and wheezing present. No rales.  Chest:     Chest wall: No tenderness.     Comments: Declined clinical breast exam Abdominal:  General: Bowel sounds are normal. There is no distension.     Palpations: Abdomen is soft. There is no shifting dullness, fluid wave, mass or pulsatile mass.     Tenderness: There is no abdominal tenderness. There is no guarding or rebound.  Musculoskeletal:        General: No tenderness or deformity. Normal range of motion.     Cervical back: Normal range of motion and neck supple.     Right lower leg: No edema.     Left lower leg: No edema.  Lymphadenopathy:     Cervical: No cervical adenopathy.  Skin:    General: Skin is warm and dry.     Capillary Refill: Capillary refill takes less than 2 seconds.     Coloration: Skin is not pale.     Findings: No erythema or rash.  Neurological:     Mental Status: She is alert and oriented to person, place, and time.     Cranial Nerves: No cranial nerve deficit.     Motor: No abnormal muscle tone.      Coordination: Coordination normal.     Gait: Gait normal.     Deep Tendon Reflexes: Reflexes are normal and symmetric.  Psychiatric:        Mood and Affect: Mood normal.        Behavior: Behavior normal. Behavior is cooperative.        Thought Content: Thought content normal.        Judgment: Judgment normal.        Assessment/Plan: 1. Encounter for general adult medical examination with abnormal findings Age-appropriate preventive screenings and vaccinations discussed, annual physical exam completed. Routine labs for health maintenance deferred. PHM updated.  - dexamethasone (DECADRON) 4 MG tablet; Take 20 mg by mouth daily. - Sodium Fluoride (PREVIDENT 5000 BOOSTER PLUS) 1.1 % PSTE; Place onto teeth.  2. Acute non-recurrent pansinusitis Empiric antibiotic treatment prescribed and medication prn for cough - levofloxacin (LEVAQUIN) 500 MG tablet; Take 1 tablet (500 mg total) by mouth daily.  Dispense: 10 tablet; Refill: 0 - chlorpheniramine-HYDROcodone (TUSSIONEX) 10-8 MG/5ML; Take 5 mLs by mouth every 12 (twelve) hours as needed for cough.  Dispense: 70 mL; Refill: 0  3. Multiple myeloma not having achieved remission ([place]) Active treatment with chemo, switched to a new chemo drug recently  - dexamethasone (DECADRON) 4 MG tablet; Take 20 mg by mouth daily. - oxyCODONE (OXY IR/ROXICODONE) 5 MG immediate release tablet; Take 5 mg by mouth every 4 (four) hours as needed. - carfilzomib in dextrose 5 % 50 mL; Getting [facility] at [facility] - chlorhexidine (PERIDEX) 0.12 % solution; Swish and spit 15 ml once or twice daily to prevent oral/dental infections  Dispense: 473 mL; Refill: 0     General Counseling: Anessia verbalizes understanding of the findings of todays visit and agrees with plan of treatment. I have discussed any further diagnostic evaluation that may be needed or ordered today. We also reviewed her medications today. she has been encouraged to call the office with any  questions or concerns that should arise related to todays visit.    No orders of the defined types were placed in this encounter.   Meds ordered this encounter  Medications   carfilzomib in dextrose 5 % 50 mL    Sig: Getting IV chemo at [facility]   levofloxacin (LEVAQUIN) 500 MG tablet    Sig: Take 1 tablet (500 mg total) by mouth daily.    Dispense:  10  tablet    Refill:  0   chlorpheniramine-HYDROcodone (TUSSIONEX) 10-8 MG/5ML    Sig: Take 5 mLs by mouth every 12 (twelve) hours as needed for cough.    Dispense:  70 mL    Refill:  0   chlorhexidine (PERIDEX) 0.12 % solution    Sig: Swish and spit 15 ml once or twice daily to prevent oral/dental infections    Dispense:  473 mL    Refill:  0    Return in about 3 months (around 03/15/2023) for F/U, Aseel [place] PCP prefers tuesdays.   Total time spent:30 Minutes Time spent includes review of chart, medications, test results, and follow up plan with the patient.   [place] Controlled Substance Database was reviewed by me.  This patient was seen by [name], FNP-C in collaboration with Dr. Clayborn Bigness as a part of collaborative care agreement.  Mikailah Morel R. Valetta Fuller, MSN, FNP-C Internal medicine

## 2022-12-16 ENCOUNTER — Other Ambulatory Visit: Payer: Self-pay | Admitting: Nurse Practitioner

## 2022-12-16 ENCOUNTER — Other Ambulatory Visit: Payer: Self-pay | Admitting: Internal Medicine

## 2022-12-16 DIAGNOSIS — K529 Noninfective gastroenteritis and colitis, unspecified: Secondary | ICD-10-CM

## 2022-12-16 NOTE — Telephone Encounter (Signed)
Can you please review its ok to send

## 2022-12-23 DIAGNOSIS — C9002 Multiple myeloma in relapse: Secondary | ICD-10-CM | POA: Diagnosis not present

## 2022-12-23 DIAGNOSIS — F32A Depression, unspecified: Secondary | ICD-10-CM | POA: Diagnosis not present

## 2022-12-23 DIAGNOSIS — D801 Nonfamilial hypogammaglobulinemia: Secondary | ICD-10-CM | POA: Diagnosis not present

## 2022-12-23 DIAGNOSIS — R0789 Other chest pain: Secondary | ICD-10-CM | POA: Diagnosis not present

## 2022-12-23 DIAGNOSIS — Z79899 Other long term (current) drug therapy: Secondary | ICD-10-CM | POA: Diagnosis not present

## 2022-12-23 DIAGNOSIS — Z5111 Encounter for antineoplastic chemotherapy: Secondary | ICD-10-CM | POA: Diagnosis not present

## 2022-12-23 DIAGNOSIS — Z882 Allergy status to sulfonamides status: Secondary | ICD-10-CM | POA: Diagnosis not present

## 2022-12-23 DIAGNOSIS — R0609 Other forms of dyspnea: Secondary | ICD-10-CM | POA: Diagnosis not present

## 2022-12-23 DIAGNOSIS — F419 Anxiety disorder, unspecified: Secondary | ICD-10-CM | POA: Diagnosis not present

## 2022-12-23 DIAGNOSIS — Z923 Personal history of irradiation: Secondary | ICD-10-CM | POA: Diagnosis not present

## 2022-12-23 DIAGNOSIS — D61818 Other pancytopenia: Secondary | ICD-10-CM | POA: Diagnosis not present

## 2022-12-23 DIAGNOSIS — Z79891 Long term (current) use of opiate analgesic: Secondary | ICD-10-CM | POA: Diagnosis not present

## 2022-12-23 DIAGNOSIS — D61810 Antineoplastic chemotherapy induced pancytopenia: Secondary | ICD-10-CM | POA: Diagnosis not present

## 2022-12-30 DIAGNOSIS — F32A Depression, unspecified: Secondary | ICD-10-CM | POA: Diagnosis not present

## 2022-12-30 DIAGNOSIS — Z882 Allergy status to sulfonamides status: Secondary | ICD-10-CM | POA: Diagnosis not present

## 2022-12-30 DIAGNOSIS — C9002 Multiple myeloma in relapse: Secondary | ICD-10-CM | POA: Diagnosis not present

## 2022-12-30 DIAGNOSIS — Z888 Allergy status to other drugs, medicaments and biological substances status: Secondary | ICD-10-CM | POA: Diagnosis not present

## 2022-12-30 DIAGNOSIS — Z884 Allergy status to anesthetic agent status: Secondary | ICD-10-CM | POA: Diagnosis not present

## 2022-12-30 DIAGNOSIS — Z923 Personal history of irradiation: Secondary | ICD-10-CM | POA: Diagnosis not present

## 2022-12-30 DIAGNOSIS — Z79899 Other long term (current) drug therapy: Secondary | ICD-10-CM | POA: Diagnosis not present

## 2022-12-30 DIAGNOSIS — R0789 Other chest pain: Secondary | ICD-10-CM | POA: Diagnosis not present

## 2022-12-30 DIAGNOSIS — Z885 Allergy status to narcotic agent status: Secondary | ICD-10-CM | POA: Diagnosis not present

## 2022-12-30 DIAGNOSIS — R0609 Other forms of dyspnea: Secondary | ICD-10-CM | POA: Diagnosis not present

## 2022-12-30 DIAGNOSIS — Z79891 Long term (current) use of opiate analgesic: Secondary | ICD-10-CM | POA: Diagnosis not present

## 2022-12-30 DIAGNOSIS — Z886 Allergy status to analgesic agent status: Secondary | ICD-10-CM | POA: Diagnosis not present

## 2022-12-30 DIAGNOSIS — G4700 Insomnia, unspecified: Secondary | ICD-10-CM | POA: Diagnosis not present

## 2022-12-30 DIAGNOSIS — F419 Anxiety disorder, unspecified: Secondary | ICD-10-CM | POA: Diagnosis not present

## 2022-12-30 DIAGNOSIS — M8448XA Pathological fracture, other site, initial encounter for fracture: Secondary | ICD-10-CM | POA: Diagnosis not present

## 2022-12-30 DIAGNOSIS — Z91041 Radiographic dye allergy status: Secondary | ICD-10-CM | POA: Diagnosis not present

## 2022-12-30 DIAGNOSIS — H3530 Unspecified macular degeneration: Secondary | ICD-10-CM | POA: Diagnosis not present

## 2022-12-30 DIAGNOSIS — E538 Deficiency of other specified B group vitamins: Secondary | ICD-10-CM | POA: Diagnosis not present

## 2022-12-30 DIAGNOSIS — S2231XA Fracture of one rib, right side, initial encounter for closed fracture: Secondary | ICD-10-CM | POA: Diagnosis not present

## 2022-12-30 DIAGNOSIS — R0781 Pleurodynia: Secondary | ICD-10-CM | POA: Diagnosis not present

## 2022-12-30 DIAGNOSIS — Z792 Long term (current) use of antibiotics: Secondary | ICD-10-CM | POA: Diagnosis not present

## 2022-12-30 DIAGNOSIS — Z880 Allergy status to penicillin: Secondary | ICD-10-CM | POA: Diagnosis not present

## 2022-12-30 DIAGNOSIS — Z881 Allergy status to other antibiotic agents status: Secondary | ICD-10-CM | POA: Diagnosis not present

## 2022-12-30 DIAGNOSIS — D61818 Other pancytopenia: Secondary | ICD-10-CM | POA: Diagnosis not present

## 2023-01-04 DIAGNOSIS — H40003 Preglaucoma, unspecified, bilateral: Secondary | ICD-10-CM | POA: Diagnosis not present

## 2023-01-04 DIAGNOSIS — H353221 Exudative age-related macular degeneration, left eye, with active choroidal neovascularization: Secondary | ICD-10-CM | POA: Diagnosis not present

## 2023-01-04 DIAGNOSIS — Z961 Presence of intraocular lens: Secondary | ICD-10-CM | POA: Diagnosis not present

## 2023-01-06 DIAGNOSIS — Z882 Allergy status to sulfonamides status: Secondary | ICD-10-CM | POA: Diagnosis not present

## 2023-01-06 DIAGNOSIS — K219 Gastro-esophageal reflux disease without esophagitis: Secondary | ICD-10-CM | POA: Diagnosis not present

## 2023-01-06 DIAGNOSIS — D84821 Immunodeficiency due to drugs: Secondary | ICD-10-CM | POA: Diagnosis not present

## 2023-01-06 DIAGNOSIS — S02611A Fracture of condylar process of right mandible, initial encounter for closed fracture: Secondary | ICD-10-CM | POA: Diagnosis not present

## 2023-01-06 DIAGNOSIS — Z880 Allergy status to penicillin: Secondary | ICD-10-CM | POA: Diagnosis not present

## 2023-01-06 DIAGNOSIS — I10 Essential (primary) hypertension: Secondary | ICD-10-CM | POA: Diagnosis not present

## 2023-01-06 DIAGNOSIS — R0609 Other forms of dyspnea: Secondary | ICD-10-CM | POA: Diagnosis not present

## 2023-01-06 DIAGNOSIS — D61818 Other pancytopenia: Secondary | ICD-10-CM | POA: Diagnosis not present

## 2023-01-06 DIAGNOSIS — R6884 Jaw pain: Secondary | ICD-10-CM | POA: Diagnosis not present

## 2023-01-06 DIAGNOSIS — G893 Neoplasm related pain (acute) (chronic): Secondary | ICD-10-CM | POA: Diagnosis not present

## 2023-01-06 DIAGNOSIS — M898X5 Other specified disorders of bone, thigh: Secondary | ICD-10-CM | POA: Diagnosis not present

## 2023-01-06 DIAGNOSIS — Z9221 Personal history of antineoplastic chemotherapy: Secondary | ICD-10-CM | POA: Diagnosis not present

## 2023-01-06 DIAGNOSIS — R0781 Pleurodynia: Secondary | ICD-10-CM | POA: Diagnosis not present

## 2023-01-06 DIAGNOSIS — M8458XA Pathological fracture in neoplastic disease, other specified site, initial encounter for fracture: Secondary | ICD-10-CM | POA: Diagnosis not present

## 2023-01-06 DIAGNOSIS — Z8579 Personal history of other malignant neoplasms of lymphoid, hematopoietic and related tissues: Secondary | ICD-10-CM | POA: Diagnosis not present

## 2023-01-06 DIAGNOSIS — E785 Hyperlipidemia, unspecified: Secondary | ICD-10-CM | POA: Diagnosis not present

## 2023-01-06 DIAGNOSIS — D801 Nonfamilial hypogammaglobulinemia: Secondary | ICD-10-CM | POA: Diagnosis not present

## 2023-01-06 DIAGNOSIS — R0789 Other chest pain: Secondary | ICD-10-CM | POA: Diagnosis not present

## 2023-01-06 DIAGNOSIS — Z5111 Encounter for antineoplastic chemotherapy: Secondary | ICD-10-CM | POA: Diagnosis not present

## 2023-01-06 DIAGNOSIS — G4700 Insomnia, unspecified: Secondary | ICD-10-CM | POA: Diagnosis not present

## 2023-01-06 DIAGNOSIS — M84551A Pathological fracture in neoplastic disease, right femur, initial encounter for fracture: Secondary | ICD-10-CM | POA: Diagnosis not present

## 2023-01-06 DIAGNOSIS — D61810 Antineoplastic chemotherapy induced pancytopenia: Secondary | ICD-10-CM | POA: Diagnosis not present

## 2023-01-06 DIAGNOSIS — M25551 Pain in right hip: Secondary | ICD-10-CM | POA: Diagnosis not present

## 2023-01-06 DIAGNOSIS — S02611D Fracture of condylar process of right mandible, subsequent encounter for fracture with routine healing: Secondary | ICD-10-CM | POA: Diagnosis not present

## 2023-01-06 DIAGNOSIS — T451X5A Adverse effect of antineoplastic and immunosuppressive drugs, initial encounter: Secondary | ICD-10-CM | POA: Diagnosis not present

## 2023-01-06 DIAGNOSIS — C9000 Multiple myeloma not having achieved remission: Secondary | ICD-10-CM | POA: Diagnosis not present

## 2023-01-06 DIAGNOSIS — C9002 Multiple myeloma in relapse: Secondary | ICD-10-CM | POA: Diagnosis not present

## 2023-01-06 DIAGNOSIS — Z79899 Other long term (current) drug therapy: Secondary | ICD-10-CM | POA: Diagnosis not present

## 2023-01-11 DIAGNOSIS — C9002 Multiple myeloma in relapse: Secondary | ICD-10-CM | POA: Diagnosis not present

## 2023-01-11 DIAGNOSIS — S02611D Fracture of condylar process of right mandible, subsequent encounter for fracture with routine healing: Secondary | ICD-10-CM | POA: Diagnosis not present

## 2023-01-20 DIAGNOSIS — R0789 Other chest pain: Secondary | ICD-10-CM | POA: Diagnosis not present

## 2023-01-20 DIAGNOSIS — R0609 Other forms of dyspnea: Secondary | ICD-10-CM | POA: Diagnosis not present

## 2023-01-20 DIAGNOSIS — C9002 Multiple myeloma in relapse: Secondary | ICD-10-CM | POA: Diagnosis not present

## 2023-01-27 DIAGNOSIS — J90 Pleural effusion, not elsewhere classified: Secondary | ICD-10-CM | POA: Diagnosis not present

## 2023-01-27 DIAGNOSIS — R6884 Jaw pain: Secondary | ICD-10-CM | POA: Diagnosis not present

## 2023-01-27 DIAGNOSIS — C9000 Multiple myeloma not having achieved remission: Secondary | ICD-10-CM | POA: Diagnosis not present

## 2023-01-27 DIAGNOSIS — D801 Nonfamilial hypogammaglobulinemia: Secondary | ICD-10-CM | POA: Diagnosis not present

## 2023-01-27 DIAGNOSIS — R0781 Pleurodynia: Secondary | ICD-10-CM | POA: Diagnosis not present

## 2023-01-27 DIAGNOSIS — Z20822 Contact with and (suspected) exposure to covid-19: Secondary | ICD-10-CM | POA: Diagnosis not present

## 2023-01-27 DIAGNOSIS — R0609 Other forms of dyspnea: Secondary | ICD-10-CM | POA: Diagnosis not present

## 2023-01-27 DIAGNOSIS — R0789 Other chest pain: Secondary | ICD-10-CM | POA: Diagnosis not present

## 2023-01-27 DIAGNOSIS — M19011 Primary osteoarthritis, right shoulder: Secondary | ICD-10-CM | POA: Diagnosis not present

## 2023-01-27 DIAGNOSIS — D61810 Antineoplastic chemotherapy induced pancytopenia: Secondary | ICD-10-CM | POA: Diagnosis not present

## 2023-01-27 DIAGNOSIS — E785 Hyperlipidemia, unspecified: Secondary | ICD-10-CM | POA: Diagnosis not present

## 2023-01-27 DIAGNOSIS — C9002 Multiple myeloma in relapse: Secondary | ICD-10-CM | POA: Diagnosis not present

## 2023-01-27 DIAGNOSIS — I10 Essential (primary) hypertension: Secondary | ICD-10-CM | POA: Diagnosis not present

## 2023-01-27 DIAGNOSIS — R5383 Other fatigue: Secondary | ICD-10-CM | POA: Diagnosis not present

## 2023-01-27 DIAGNOSIS — F32A Depression, unspecified: Secondary | ICD-10-CM | POA: Diagnosis not present

## 2023-01-27 DIAGNOSIS — E041 Nontoxic single thyroid nodule: Secondary | ICD-10-CM | POA: Diagnosis not present

## 2023-01-27 DIAGNOSIS — R100 Acute abdomen: Secondary | ICD-10-CM | POA: Diagnosis not present

## 2023-01-27 DIAGNOSIS — H3530 Unspecified macular degeneration: Secondary | ICD-10-CM | POA: Diagnosis not present

## 2023-01-27 DIAGNOSIS — R109 Unspecified abdominal pain: Secondary | ICD-10-CM | POA: Diagnosis not present

## 2023-01-27 DIAGNOSIS — R509 Fever, unspecified: Secondary | ICD-10-CM | POA: Diagnosis not present

## 2023-01-27 DIAGNOSIS — Z79899 Other long term (current) drug therapy: Secondary | ICD-10-CM | POA: Diagnosis not present

## 2023-01-27 DIAGNOSIS — Z1152 Encounter for screening for COVID-19: Secondary | ICD-10-CM | POA: Diagnosis not present

## 2023-01-27 DIAGNOSIS — E871 Hypo-osmolality and hyponatremia: Secondary | ICD-10-CM | POA: Diagnosis not present

## 2023-01-27 DIAGNOSIS — R531 Weakness: Secondary | ICD-10-CM | POA: Diagnosis not present

## 2023-01-27 DIAGNOSIS — M25552 Pain in left hip: Secondary | ICD-10-CM | POA: Diagnosis not present

## 2023-01-27 DIAGNOSIS — F419 Anxiety disorder, unspecified: Secondary | ICD-10-CM | POA: Diagnosis not present

## 2023-01-27 DIAGNOSIS — D61818 Other pancytopenia: Secondary | ICD-10-CM | POA: Diagnosis not present

## 2023-01-27 DIAGNOSIS — M25551 Pain in right hip: Secondary | ICD-10-CM | POA: Diagnosis not present

## 2023-01-28 DIAGNOSIS — C9002 Multiple myeloma in relapse: Secondary | ICD-10-CM | POA: Diagnosis not present

## 2023-01-28 DIAGNOSIS — R531 Weakness: Secondary | ICD-10-CM | POA: Diagnosis not present

## 2023-01-28 DIAGNOSIS — R0609 Other forms of dyspnea: Secondary | ICD-10-CM | POA: Diagnosis not present

## 2023-01-28 DIAGNOSIS — R0789 Other chest pain: Secondary | ICD-10-CM | POA: Diagnosis not present

## 2023-02-01 ENCOUNTER — Ambulatory Visit: Payer: PPO | Admitting: Nurse Practitioner

## 2023-02-02 DIAGNOSIS — C9002 Multiple myeloma in relapse: Secondary | ICD-10-CM | POA: Diagnosis not present

## 2023-02-03 DIAGNOSIS — Z5112 Encounter for antineoplastic immunotherapy: Secondary | ICD-10-CM | POA: Diagnosis not present

## 2023-02-03 DIAGNOSIS — Z886 Allergy status to analgesic agent status: Secondary | ICD-10-CM | POA: Diagnosis not present

## 2023-02-03 DIAGNOSIS — Z923 Personal history of irradiation: Secondary | ICD-10-CM | POA: Diagnosis not present

## 2023-02-03 DIAGNOSIS — Z9221 Personal history of antineoplastic chemotherapy: Secondary | ICD-10-CM | POA: Diagnosis not present

## 2023-02-03 DIAGNOSIS — R0789 Other chest pain: Secondary | ICD-10-CM | POA: Diagnosis not present

## 2023-02-03 DIAGNOSIS — D701 Agranulocytosis secondary to cancer chemotherapy: Secondary | ICD-10-CM | POA: Diagnosis not present

## 2023-02-03 DIAGNOSIS — Z881 Allergy status to other antibiotic agents status: Secondary | ICD-10-CM | POA: Diagnosis not present

## 2023-02-03 DIAGNOSIS — D61810 Antineoplastic chemotherapy induced pancytopenia: Secondary | ICD-10-CM | POA: Diagnosis not present

## 2023-02-03 DIAGNOSIS — Z882 Allergy status to sulfonamides status: Secondary | ICD-10-CM | POA: Diagnosis not present

## 2023-02-03 DIAGNOSIS — D84821 Immunodeficiency due to drugs: Secondary | ICD-10-CM | POA: Diagnosis not present

## 2023-02-03 DIAGNOSIS — T451X5D Adverse effect of antineoplastic and immunosuppressive drugs, subsequent encounter: Secondary | ICD-10-CM | POA: Diagnosis not present

## 2023-02-03 DIAGNOSIS — Z880 Allergy status to penicillin: Secondary | ICD-10-CM | POA: Diagnosis not present

## 2023-02-03 DIAGNOSIS — T451X5A Adverse effect of antineoplastic and immunosuppressive drugs, initial encounter: Secondary | ICD-10-CM | POA: Diagnosis not present

## 2023-02-03 DIAGNOSIS — R0609 Other forms of dyspnea: Secondary | ICD-10-CM | POA: Diagnosis not present

## 2023-02-03 DIAGNOSIS — F32A Depression, unspecified: Secondary | ICD-10-CM | POA: Diagnosis not present

## 2023-02-03 DIAGNOSIS — C9002 Multiple myeloma in relapse: Secondary | ICD-10-CM | POA: Diagnosis not present

## 2023-02-03 DIAGNOSIS — D801 Nonfamilial hypogammaglobulinemia: Secondary | ICD-10-CM | POA: Diagnosis not present

## 2023-02-03 DIAGNOSIS — E041 Nontoxic single thyroid nodule: Secondary | ICD-10-CM | POA: Diagnosis not present

## 2023-02-03 DIAGNOSIS — H3530 Unspecified macular degeneration: Secondary | ICD-10-CM | POA: Diagnosis not present

## 2023-02-03 DIAGNOSIS — Z885 Allergy status to narcotic agent status: Secondary | ICD-10-CM | POA: Diagnosis not present

## 2023-02-03 DIAGNOSIS — R6884 Jaw pain: Secondary | ICD-10-CM | POA: Diagnosis not present

## 2023-02-03 DIAGNOSIS — Z79899 Other long term (current) drug therapy: Secondary | ICD-10-CM | POA: Diagnosis not present

## 2023-02-03 DIAGNOSIS — R5383 Other fatigue: Secondary | ICD-10-CM | POA: Diagnosis not present

## 2023-02-03 DIAGNOSIS — F419 Anxiety disorder, unspecified: Secondary | ICD-10-CM | POA: Diagnosis not present

## 2023-02-09 DIAGNOSIS — C9002 Multiple myeloma in relapse: Secondary | ICD-10-CM | POA: Diagnosis not present

## 2023-02-10 DIAGNOSIS — R0609 Other forms of dyspnea: Secondary | ICD-10-CM | POA: Diagnosis not present

## 2023-02-10 DIAGNOSIS — C9002 Multiple myeloma in relapse: Secondary | ICD-10-CM | POA: Diagnosis not present

## 2023-02-10 DIAGNOSIS — Z5112 Encounter for antineoplastic immunotherapy: Secondary | ICD-10-CM | POA: Diagnosis not present

## 2023-02-10 DIAGNOSIS — R0789 Other chest pain: Secondary | ICD-10-CM | POA: Diagnosis not present

## 2023-02-12 DIAGNOSIS — M899 Disorder of bone, unspecified: Secondary | ICD-10-CM | POA: Diagnosis not present

## 2023-02-15 DIAGNOSIS — H353221 Exudative age-related macular degeneration, left eye, with active choroidal neovascularization: Secondary | ICD-10-CM | POA: Diagnosis not present

## 2023-02-16 ENCOUNTER — Encounter: Payer: Self-pay | Admitting: Nurse Practitioner

## 2023-02-16 ENCOUNTER — Ambulatory Visit (INDEPENDENT_AMBULATORY_CARE_PROVIDER_SITE_OTHER): Payer: PPO | Admitting: Nurse Practitioner

## 2023-02-16 VITALS — BP 123/73 | HR 79 | Temp 98.3°F | Resp 16 | Ht 63.0 in | Wt 124.0 lb

## 2023-02-16 DIAGNOSIS — N390 Urinary tract infection, site not specified: Secondary | ICD-10-CM | POA: Diagnosis not present

## 2023-02-16 DIAGNOSIS — R300 Dysuria: Secondary | ICD-10-CM

## 2023-02-16 LAB — POCT URINALYSIS DIPSTICK
Bilirubin, UA: NEGATIVE
Glucose, UA: NEGATIVE
Ketones, UA: POSITIVE
Nitrite, UA: NEGATIVE
Protein, UA: NEGATIVE
Spec Grav, UA: 1.01 (ref 1.010–1.025)
Urobilinogen, UA: 0.2 E.U./dL
pH, UA: 5 (ref 5.0–8.0)

## 2023-02-16 MED ORDER — CIPROFLOXACIN HCL 500 MG PO TABS: 500.0000 mg | ORAL_TABLET | Freq: Two times a day (BID) | ORAL | 0 refills | Status: DC

## 2023-02-16 NOTE — Progress Notes (Unsigned)
[facility] [address] [place], [place] [address]  Internal MEDICINE  Office Visit Note  Patient Name: Sherry Oneal  [id number]  [id number]  Date of Service: 02/16/2023  Chief Complaint  Patient presents with   Acute Visit    uti   Urinary Tract Infection     HPI Nolana presents for an acute sick visit for __ --onset 2 days ago Urinalysis is RBC and leukocytes large.  Symptoms -- bladder spasms, burning with urination, frewuency, urgency.   Updated medication list, chemo drug was changed.    Current Medication:  Outpatient Encounter Medications as of 02/16/2023  Medication Sig Note   acetaminophen (TYLENOL) 500 MG tablet Take 650 mg by mouth in the morning and at bedtime.    ALPRAZolam (XANAX) 0.25 MG tablet Take 1 tablet (0.25 mg total) by mouth 2 (two) times daily as needed for anxiety or sleep.    atorvastatin (LIPITOR) 10 MG tablet Take 1 tablet by mouth once daily    Calcium Carbonate-Vitamin D 600-400 MG-UNIT tablet Take 1 tablet by mouth daily.    chlorhexidine (PERIDEX) 0.12 % solution Swish and spit 15 ml once or twice daily to prevent oral/dental infections    chlorpheniramine-HYDROcodone (TUSSIONEX) 10-8 MG/5ML Take 5 mLs by mouth every 12 (twelve) hours as needed for cough.    ciprofloxacin (CIPRO) 500 MG tablet Take 1 tablet (500 mg total) by mouth 2 (two) times daily for 10 days.    Cranberry 500 MG CAPS Take 500 mg by mouth every morning.    dapsone 100 MG tablet Take 100 mg by mouth daily.    dexamethasone (DECADRON) 4 MG tablet Take 20 mg by mouth daily. 12/14/2022: For chemo   dicyclomine (BENTYL) 10 MG capsule TAKE 1 CAPSULE BY MOUTH THREE TIMES DAILY AS NEEDED  FOR SPASMS  TAKE WITH MEALS    diphenhydrAMINE (BENADRYL) 25 MG tablet Take 25 mg by mouth daily as needed.    docusate (COLACE) 50 MG/5ML liquid Take by mouth daily as needed for mild constipation.    fluticasone (FLONASE) 50 MCG/ACT nasal spray Use 2 spray(s) in each nostril once  daily    ipratropium-albuterol (DUONEB) 0.5-2.5 (3) MG/3ML SOLN Take 3 mLs by nebulization every 6 (six) hours as needed.    meclizine (ANTIVERT) 25 MG tablet Take 25 mg by mouth as needed for dizziness.    Multiple Vitamins-Minerals (PRESERVISION AREDS PO) Take 1 tablet by mouth every morning.    omeprazole (PRILOSEC) 20 MG capsule Take 20 mg by mouth daily.    ondansetron (ZOFRAN-ODT) 4 MG disintegrating tablet DISSOLVE 1 TABLET IN MOUTH EVERY 8 HOURS AS NEEDED FOR NAUSEA    oxyCODONE (OXY IR/ROXICODONE) 5 MG immediate release tablet Take 5 mg by mouth every 4 (four) hours as needed.    polyethylene glycol (MIRALAX / GLYCOLAX) packet Take 17 g by mouth daily as needed.    prochlorperazine (COMPAZINE) 5 MG tablet Take 5 mg by mouth every 8 (eight) hours as needed.    promethazine (PHENERGAN) 25 MG tablet Take 1 tablet (25 mg total) by mouth every 6 (six) hours as needed for nausea or vomiting.    senna (SENOKOT) 8.6 MG tablet Take 1 tablet by mouth daily as needed for constipation.    Sodium Fluoride (PREVIDENT 5000 BOOSTER PLUS) 1.1 % PSTE Place onto teeth.    TECLISTAMAB-CQYV [place] Inject into the skin.    valACYclovir (VALTREX) 500 MG tablet Take 500 mg by mouth daily.    vitamin B-12 (  CYANOCOBALAMIN) 500 MCG tablet Take 500 mcg by mouth daily.    [DISCONTINUED] carfilzomib in dextrose 5 % 50 mL Getting IV [facility] at [facility]    [DISCONTINUED] levofloxacin (LEVAQUIN) 500 MG tablet Take 1 tablet (500 mg total) by mouth daily.    No facility-administered encounter medications on file as of 02/16/2023.      Medical History: Past Medical History:  Diagnosis Date   Anemia    Arthritis    lower back   High blood pressure    High cholesterol    History of actinic keratosis    Macular degeneration    recieving 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    Multiple myeloma (HCC)    Renal insufficiency    Wears dentures    partial upper     Vital Signs: BP  123/73   Pulse 79   Temp 98.3 F (36.8 C)   Resp 16   Ht 5' 3"$  (1.6 m)   Wt 124 lb (56.2 kg)   SpO2 95%   BMI 21.97 kg/m    Review of Systems  Physical Exam    Assessment/Plan:   General Counseling: Shireen verbalizes understanding of the findings of todays visit and agrees with plan of treatment. I have discussed any further diagnostic evaluation that may be needed or ordered today. We also reviewed her medications today. she has been encouraged to call the office with any questions or concerns that should arise related to todays visit.    Counseling:    Orders Placed This Encounter  Procedures   CULTURE, URINE COMPREHENSIVE   POCT Urinalysis Dipstick    Meds ordered this encounter  Medications   ciprofloxacin (CIPRO) 500 MG tablet    Sig: Take 1 tablet (500 mg total) by mouth 2 (two) times daily for 10 days.    Dispense:  20 tablet    Refill:  0    Return in about 3 months (around 05/17/2023) for reschedule march appointment for 3 months from now. .  [employer] Controlled Substance Database was reviewed by me for overdose risk score (ORS)  Time spent:*** Minutes Time spent with patient included reviewing progress notes, labs, imaging studies, and discussing plan for follow up.   This patient was seen by [name], FNP-C in collaboration with Dr. Clayborn Bigness as a part of collaborative care agreement.  Janace Decker R. Valetta Fuller, MSN, FNP-C Internal Medicine

## 2023-02-17 ENCOUNTER — Encounter: Payer: Self-pay | Admitting: Nurse Practitioner

## 2023-02-17 DIAGNOSIS — R0789 Other chest pain: Secondary | ICD-10-CM | POA: Diagnosis not present

## 2023-02-17 DIAGNOSIS — C9002 Multiple myeloma in relapse: Secondary | ICD-10-CM | POA: Diagnosis not present

## 2023-02-17 DIAGNOSIS — R0609 Other forms of dyspnea: Secondary | ICD-10-CM | POA: Diagnosis not present

## 2023-02-20 LAB — CULTURE, URINE COMPREHENSIVE

## 2023-02-22 ENCOUNTER — Other Ambulatory Visit: Payer: Self-pay

## 2023-02-22 ENCOUNTER — Emergency Department: Payer: PPO

## 2023-02-22 ENCOUNTER — Inpatient Hospital Stay
Admission: EM | Admit: 2023-02-22 | Discharge: 2023-02-25 | DRG: 690 | Disposition: A | Discharge status: Home / Self Care | Payer: PPO | Attending: Student | Admitting: Student

## 2023-02-22 ENCOUNTER — Inpatient Hospital Stay: Payer: PPO

## 2023-02-22 ENCOUNTER — Telehealth: Payer: Self-pay | Admitting: Nurse Practitioner

## 2023-02-22 DIAGNOSIS — Z882 Allergy status to sulfonamides status: Secondary | ICD-10-CM | POA: Diagnosis not present

## 2023-02-22 DIAGNOSIS — Z880 Allergy status to penicillin: Secondary | ICD-10-CM

## 2023-02-22 DIAGNOSIS — Z9841 Cataract extraction status, right eye: Secondary | ICD-10-CM | POA: Diagnosis not present

## 2023-02-22 DIAGNOSIS — D649 Anemia, unspecified: Secondary | ICD-10-CM | POA: Diagnosis not present

## 2023-02-22 DIAGNOSIS — Z9842 Cataract extraction status, left eye: Secondary | ICD-10-CM

## 2023-02-22 DIAGNOSIS — Z961 Presence of intraocular lens: Secondary | ICD-10-CM | POA: Diagnosis not present

## 2023-02-22 DIAGNOSIS — H353220 Exudative age-related macular degeneration, left eye, stage unspecified: Secondary | ICD-10-CM | POA: Diagnosis present

## 2023-02-22 DIAGNOSIS — I10 Essential (primary) hypertension: Secondary | ICD-10-CM | POA: Diagnosis not present

## 2023-02-22 DIAGNOSIS — N3000 Acute cystitis without hematuria: Principal | ICD-10-CM | POA: Diagnosis present

## 2023-02-22 DIAGNOSIS — Z91041 Radiographic dye allergy status: Secondary | ICD-10-CM | POA: Diagnosis not present

## 2023-02-22 DIAGNOSIS — Z79899 Other long term (current) drug therapy: Secondary | ICD-10-CM | POA: Diagnosis not present

## 2023-02-22 DIAGNOSIS — Z8249 Family history of ischemic heart disease and other diseases of the circulatory system: Secondary | ICD-10-CM | POA: Diagnosis not present

## 2023-02-22 DIAGNOSIS — E7800 Pure hypercholesterolemia, unspecified: Secondary | ICD-10-CM | POA: Diagnosis present

## 2023-02-22 DIAGNOSIS — B965 Pseudomonas (aeruginosa) (mallei) (pseudomallei) as the cause of diseases classified elsewhere: Secondary | ICD-10-CM | POA: Diagnosis present

## 2023-02-22 DIAGNOSIS — C9000 Multiple myeloma not having achieved remission: Secondary | ICD-10-CM | POA: Diagnosis not present

## 2023-02-22 DIAGNOSIS — Z888 Allergy status to other drugs, medicaments and biological substances status: Secondary | ICD-10-CM | POA: Diagnosis not present

## 2023-02-22 DIAGNOSIS — N3001 Acute cystitis with hematuria: Secondary | ICD-10-CM | POA: Diagnosis present

## 2023-02-22 DIAGNOSIS — C9001 Multiple myeloma in remission: Secondary | ICD-10-CM | POA: Diagnosis not present

## 2023-02-22 DIAGNOSIS — E876 Hypokalemia: Secondary | ICD-10-CM | POA: Diagnosis not present

## 2023-02-22 DIAGNOSIS — N281 Cyst of kidney, acquired: Secondary | ICD-10-CM | POA: Diagnosis not present

## 2023-02-22 DIAGNOSIS — Z886 Allergy status to analgesic agent status: Secondary | ICD-10-CM

## 2023-02-22 DIAGNOSIS — N2889 Other specified disorders of kidney and ureter: Secondary | ICD-10-CM | POA: Diagnosis not present

## 2023-02-22 DIAGNOSIS — N390 Urinary tract infection, site not specified: Secondary | ICD-10-CM | POA: Diagnosis not present

## 2023-02-22 DIAGNOSIS — Z881 Allergy status to other antibiotic agents status: Secondary | ICD-10-CM | POA: Diagnosis not present

## 2023-02-22 DIAGNOSIS — Z1623 Resistance to quinolones and fluoroquinolones: Secondary | ICD-10-CM | POA: Diagnosis not present

## 2023-02-22 LAB — URINALYSIS, ROUTINE W REFLEX MICROSCOPIC
Bacteria, UA: NONE SEEN
Bilirubin Urine: NEGATIVE
Glucose, UA: NEGATIVE mg/dL
Hgb urine dipstick: NEGATIVE
Ketones, ur: NEGATIVE mg/dL
Nitrite: NEGATIVE
Protein, ur: NEGATIVE mg/dL
Specific Gravity, Urine: 1.009 (ref 1.005–1.030)
Squamous Epithelial / HPF: NONE SEEN /HPF (ref 0–5)
WBC, UA: >50 WBC/hpf (ref 0–5)
pH: 5 (ref 5.0–8.0)

## 2023-02-22 LAB — CBC WITH DIFFERENTIAL/PLATELET
Abs Immature Granulocytes: 0.05 10*3/uL (ref 0.00–0.07)
Basophils Absolute: 0.1 10*3/uL (ref 0.0–0.1)
Basophils Relative: 1 %
Eosinophils Absolute: 0.1 10*3/uL (ref 0.0–0.5)
Eosinophils Relative: 2 %
HCT: 36.9 % (ref 36.0–46.0)
Hemoglobin: 11.7 g/dL — ABNORMAL LOW (ref 12.0–15.0)
Immature Granulocytes: 1 %
Lymphocytes Relative: 27 %
Lymphs Abs: 2 10*3/uL (ref 0.7–4.0)
MCH: 30.2 pg (ref 26.0–34.0)
MCHC: 31.7 g/dL (ref 30.0–36.0)
MCV: 95.3 fL (ref 80.0–100.0)
Monocytes Absolute: 1 10*3/uL (ref 0.1–1.0)
Monocytes Relative: 14 %
Neutro Abs: 4 10*3/uL (ref 1.7–7.7)
Neutrophils Relative %: 55 %
Platelets: 328 10*3/uL (ref 150–400)
RBC: 3.87 MIL/uL (ref 3.87–5.11)
RDW: 16 % — ABNORMAL HIGH (ref 11.5–15.5)
WBC: 7.2 10*3/uL (ref 4.0–10.5)
nRBC: 0 % (ref 0.0–0.2)

## 2023-02-22 LAB — COMPREHENSIVE METABOLIC PANEL
ALT: 18 U/L (ref 0–44)
AST: 29 U/L (ref 15–41)
Albumin: 3.5 g/dL (ref 3.5–5.0)
Alkaline Phosphatase: 243 U/L — ABNORMAL HIGH (ref 38–126)
Anion gap: 9 (ref 5–15)
BUN: 11 mg/dL (ref 8–23)
CO2: 21 mmol/L — ABNORMAL LOW (ref 22–32)
Calcium: 8.5 mg/dL — ABNORMAL LOW (ref 8.9–10.3)
Chloride: 107 mmol/L (ref 98–111)
Creatinine, Ser: 0.77 mg/dL (ref 0.44–1.00)
GFR, Estimated: >60 mL/min (ref >60)
Glucose, Bld: 128 mg/dL — ABNORMAL HIGH (ref 70–99)
Potassium: 3.5 mmol/L (ref 3.5–5.1)
Sodium: 137 mmol/L (ref 135–145)
Total Bilirubin: 0.8 mg/dL (ref 0.3–1.2)
Total Protein: 6.6 g/dL (ref 6.5–8.1)

## 2023-02-22 LAB — LACTIC ACID, PLASMA: Lactic Acid, Venous: 1.8 mmol/L (ref 0.5–1.9)

## 2023-02-22 LAB — PROTIME-INR
INR: 1 (ref 0.8–1.2)
Prothrombin Time: 12.9 seconds (ref 11.4–15.2)

## 2023-02-22 MED ORDER — DICYCLOMINE HCL 10 MG PO CAPS: 10.0000 mg | ORAL_CAPSULE | Freq: Three times a day (TID) | ORAL | Status: DC | PRN

## 2023-02-22 MED ORDER — ALPRAZOLAM 0.25 MG PO TABS: 0.2500 mg | ORAL_TABLET | Freq: Two times a day (BID) | ORAL | Status: DC | PRN

## 2023-02-22 MED ORDER — SODIUM CHLORIDE 0.9 % IV BOLUS
1000.0000 mL | Freq: Once | INTRAVENOUS | Status: AC
  Administered 2023-02-22: 1000 mL via INTRAVENOUS

## 2023-02-22 MED ORDER — ATORVASTATIN CALCIUM 10 MG PO TABS
10.0000 mg | ORAL_TABLET | Freq: Every day | ORAL | Status: DC
  Administered 2023-02-23 – 2023-02-25 (×3): 10 mg via ORAL
  Filled 2023-02-22 (×3): qty 1

## 2023-02-22 MED ORDER — ENOXAPARIN SODIUM 40 MG/0.4ML IJ SOSY
40.0000 mg | PREFILLED_SYRINGE | INTRAMUSCULAR | Status: DC
  Administered 2023-02-22 – 2023-02-24 (×3): 40 mg via SUBCUTANEOUS
  Filled 2023-02-22 (×3): qty 0.4

## 2023-02-22 MED ORDER — SODIUM CHLORIDE 0.9 % IV SOLN
2.0000 g | Freq: Once | INTRAVENOUS | Status: AC
  Administered 2023-02-22: 2 g via INTRAVENOUS
  Filled 2023-02-22: qty 12.5

## 2023-02-22 MED ORDER — ONDANSETRON HCL 4 MG/2ML IJ SOLN: 4.0000 mg | Freq: Four times a day (QID) | INTRAMUSCULAR | Status: DC | PRN

## 2023-02-22 MED ORDER — SODIUM CHLORIDE 0.9 % IV SOLN
2.0000 g | Freq: Two times a day (BID) | INTRAVENOUS | Status: DC
  Administered 2023-02-23 – 2023-02-25 (×5): 2 g via INTRAVENOUS
  Filled 2023-02-22: qty 12.5
  Filled 2023-02-22: qty 2
  Filled 2023-02-22 (×4): qty 12.5

## 2023-02-22 MED ORDER — ACETAMINOPHEN 325 MG PO TABS
650.0000 mg | ORAL_TABLET | Freq: Four times a day (QID) | ORAL | Status: DC | PRN
  Administered 2023-02-22 – 2023-02-24 (×3): 650 mg via ORAL
  Filled 2023-02-22 (×3): qty 2

## 2023-02-22 MED ORDER — LABETALOL HCL 5 MG/ML IV SOLN: 10.0000 mg | INTRAVENOUS | Status: DC | PRN

## 2023-02-22 MED ORDER — DAPSONE 100 MG PO TABS
100.0000 mg | ORAL_TABLET | Freq: Every day | ORAL | Status: DC
  Administered 2023-02-23 – 2023-02-25 (×3): 100 mg via ORAL
  Filled 2023-02-22 (×3): qty 1

## 2023-02-22 MED ORDER — IPRATROPIUM-ALBUTEROL 0.5-2.5 (3) MG/3ML IN SOLN: 3.0000 mL | Freq: Four times a day (QID) | RESPIRATORY_TRACT | Status: DC | PRN

## 2023-02-22 MED ORDER — VALACYCLOVIR HCL 500 MG PO TABS
500.0000 mg | ORAL_TABLET | Freq: Every day | ORAL | Status: DC
  Administered 2023-02-23 – 2023-02-25 (×3): 500 mg via ORAL
  Filled 2023-02-22 (×3): qty 1

## 2023-02-22 MED ORDER — ACETAMINOPHEN 650 MG RE SUPP: 650.0000 mg | Freq: Four times a day (QID) | RECTAL | Status: DC | PRN

## 2023-02-22 MED ORDER — TRAZODONE HCL 50 MG PO TABS
50.0000 mg | ORAL_TABLET | Freq: Every day | ORAL | Status: DC
  Administered 2023-02-22: 50 mg via ORAL
  Filled 2023-02-22: qty 1

## 2023-02-22 MED ORDER — ONDANSETRON HCL 4 MG PO TABS: 4.0000 mg | ORAL_TABLET | Freq: Four times a day (QID) | ORAL | Status: DC | PRN

## 2023-02-22 NOTE — Progress Notes (Signed)
Pharmacy Antibiotic Note  Sherry Oneal is a 77 y.o. female admitted on 02/22/2023 with pneumonia.  PMH multiple myeloma and history Pseudomonas in urine. Pharmacy has been consulted for cefepime dosing.  History of penicillin allergy with reaction of rash.  Plan: Cefepime IV 2 grams every 12 hours  Follow renal function for dose adjustments.     Temp (24hrs), Avg:98.1 F (36.7 C), Min:98.1 F (36.7 C), Max:98.1 F (36.7 C)  Recent Labs  Lab 02/22/23 1328  WBC 7.2  CREATININE 0.77  LATICACIDVEN 1.8    Estimated Creatinine Clearance: 49.5 mL/min (by C-G formula based on SCr of 0.77 mg/dL).    Allergies  Allergen Reactions   Aspirin Other (See Comments)    Causes internal bleeding per patient   Atenolol     "wierd feeling"   Doxycycline     Upset stomach    Epinephrine     "Shaking"   Ivp Dye [Iodinated Contrast Media] Hypertension    Per tech conversation with PT, pt states that the last time she had IV dye she had increased BP and tachycardia.      Ketorolac    Nsaids     Bleeding   Penicillins     rash   Sulfa Antibiotics     Rash    Tolmetin     Bleeding   Tramadol     Numb feeling    Procaine Palpitations    States this was added due to palpitations and shakes after receiving local anesthetic containing epinephrine.     Antimicrobials this admission: cefepime 2/26 >>    Dose adjustments this admission: N/a  Microbiology results: 2/26 BCx: in process  Thank you for allowing pharmacy to be a part of this patient's care.  Wynelle Cleveland 02/22/2023 4:01 PM

## 2023-02-22 NOTE — Plan of Care (Signed)

## 2023-02-22 NOTE — Progress Notes (Signed)
Patient arrived to the  unit. X1 assist with walker from wheel chair to bed. No complaints at this time.

## 2023-02-22 NOTE — ED Notes (Signed)
Pt ambulatory to and from restroom with walker and back into bed. Pt provided with x2 warm blankets per request. Pt denies further needs at this time, WCTM.

## 2023-02-22 NOTE — ED Triage Notes (Signed)
Pt to ED sent by PCP for dehydration and UTI. Pt dx with UTI and has been on PO antibiotics. MD stating feels pt needs IV antibiotics due to worsening symptoms. Md also concerned for dehydration.

## 2023-02-22 NOTE — Telephone Encounter (Signed)
Called patient about her urine culture.   She was originally prescribed ciprofloxacin for UTI.  Urine culture positive for pseudomonas aeruginosa, resistant to levofloxacin and ciprofloxacin.  All the antibiotics that it is sensitive to are intravenous or intramuscular.  Patient has multiple myeloma and is high risk.  Patient was informed that she will most likely need to be admitted to the hospital for intravenous antibiotics.   Patient called her oncologist to notify them and see if they want her to be admitted to [facility] but they requested that she be admitted locally at [facility] today if possible.   Patient instructed at 1200 PM to go to [facility] ED and Dr. Clayborn Bigness will call to give information to the charge nurse in the [facility] ED.

## 2023-02-22 NOTE — H&P (Signed)
History and Physical    Patient: Sherry Oneal [id number] DOB: Sep 03, 1946 DOA: 02/22/2023 DOS: the patient was seen and examined on 02/22/2023 PCP: [name], NP  Patient coming from: Home  Chief Complaint: Abnormal urine culture  HPI: Sherry Oneal is a 77 y.o. female with medical history significant of multiple myeloma on Teclistamab and IVIG, macular degeneration, hyperlipidemia, hypertension, who presents to the ED due to abnormal urine culture.  [name] states that approximately 6 days ago, she developed significant dysuria and lower abdominal pain.  She followed up with her PCP on that day and was started on ciprofloxacin.  Since then, her symptoms have gotten somewhat better but are still present.  Today, her PCP called her to inform her that urine culture grew Pseudomonas that was resistant to her oral antibiotics.  Due to this, she was instructed to go to the ED.  [name] denies any nausea, vomiting, fever, chills, chest pain, shortness of breath, palpitations.  She endorses some back pain but felt that it may be due to her new mattress.  In addition, she endorses bilateral lower extremity muscle aches.  ED course: On arrival to the ED, patient was hypertensive at 172/83 with a heart rate of 102.  Heart rate improved to 82.  She was saturating at 92% on room air.  Initial workup remarkable for WBC of 7.2, hemoglobin of 11.7, bicarb of 21, glucose of 128, creatinine of 0.77, alkaline phosphatase of 243 and GFR above 60.  Lactic acid within normal limits at 1.8.  Urinalysis was obtained that demonstrated large leukocytes, over 50 WBCs/hpf but no bacteria.  Patient started on cefepime due to previous urine culture demonstrated Pseudomonas resistant to levofloxacin and ciprofloxacin.  Due to acute cystitis with multiresistant Pseudomonas, [facility] contacted for admission.  Review of Systems: As mentioned in the history of present illness. All other systems reviewed and are  negative.  Past Medical History:  Diagnosis Date   Anemia    Arthritis    lower back   High blood pressure    High cholesterol    History of actinic keratosis    Macular degeneration    recieving 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    Multiple myeloma (HCC)    Renal insufficiency    Wears dentures    partial upper   Past Surgical History:  Procedure Laterality Date   CATARACT EXTRACTION W/PHACO Right 08/17/2018   Procedure: CATARACT EXTRACTION PHACO AND INTRAOCULAR LENS PLACEMENT ([place])  RIGHT;  Surgeon: Leandrew Koyanagi, MD;  Location: [place];  Service: Ophthalmology;  Laterality: Right;   CATARACT EXTRACTION W/PHACO Left 11/09/2018   Procedure: CATARACT EXTRACTION PHACO AND INTRAOCULAR LENS PLACEMENT ([place]) LEFT;  Surgeon: Leandrew Koyanagi, MD;  Location: [place];  Service: Ophthalmology;  Laterality: Left;   EXPLORATORY LAPAROTOMY     Social History:  reports that she has never smoked. She has never used smokeless tobacco. She reports that she does not drink alcohol and does not use drugs.  Allergies  Allergen Reactions   Aspirin Other (See Comments)    Causes internal bleeding per patient   Atenolol     "wierd feeling"   Doxycycline     Upset stomach    Epinephrine     "Shaking"   Ivp Dye [Iodinated Contrast Media] Hypertension    Per tech conversation with PT, pt states that the last time she had IV dye she had increased BP and tachycardia.  Ketorolac    Nsaids     Bleeding   Penicillins     rash   Sulfa Antibiotics     Rash    Tolmetin     Bleeding   Tramadol     Numb feeling    Procaine Palpitations    States this was added due to palpitations and shakes after receiving local anesthetic containing epinephrine.     Family History  Problem Relation Age of Onset   Breast cancer Maternal Grandmother 51   CAD Mother    CVA Mother     Prior to Admission medications   Medication Sig Start  Date End Date Taking? Authorizing Provider  acetaminophen (TYLENOL) 500 MG tablet Take 650 mg by mouth in the morning and at bedtime.   Yes [provider]  ALPRAZolam (XANAX) 0.25 MG tablet Take 1 tablet (0.25 mg total) by mouth 2 (two) times daily as needed for anxiety or sleep. 08/13/22  Yes Abernathy, Yetta Flock, NP  atorvastatin (LIPITOR) 10 MG tablet Take 1 tablet by mouth once daily 09/23/22  Yes Abernathy, Alyssa, NP  Calcium Carbonate-Vitamin D 600-400 MG-UNIT tablet Take 1 tablet by mouth daily.   Yes [provider]  chlorhexidine (PERIDEX) 0.12 % solution Swish and spit 15 ml once or twice daily to prevent oral/dental infections 12/14/22  Yes Abernathy, Alyssa, NP  ciprofloxacin (CIPRO) 500 MG tablet Take 1 tablet (500 mg total) by mouth 2 (two) times daily for 10 days. 02/16/23 02/26/23 Yes Abernathy, Alyssa, NP  Cranberry 500 MG CAPS Take 500 mg by mouth every morning.   Yes [provider]  dapsone 100 MG tablet Take 100 mg by mouth daily.   Yes [provider]  dexamethasone (DECADRON) 4 MG tablet Take 20 mg by mouth daily. 10/27/22  Yes [provider]  dicyclomine (BENTYL) 10 MG capsule TAKE 1 CAPSULE BY MOUTH THREE TIMES DAILY AS NEEDED  FOR SPASMS  TAKE WITH MEALS 12/16/22  Yes Abernathy, Alyssa, NP  diphenhydrAMINE (BENADRYL) 25 MG tablet Take 25 mg by mouth daily as needed.   Yes [provider]  docusate (COLACE) 50 MG/5ML liquid Take by mouth daily as needed for mild constipation.   Yes [provider]  fluticasone (FLONASE) 50 MCG/ACT nasal spray Use 2 spray(s) in each nostril once daily 12/16/22  Yes Abernathy, Alyssa, NP  ipratropium-albuterol (DUONEB) 0.5-2.5 (3) MG/3ML SOLN Take 3 mLs by nebulization every 6 (six) hours as needed. 03/14/19  Yes Scarboro, Audie Clear, NP  meclizine (ANTIVERT) 25 MG tablet Take 25 mg by mouth as needed for dizziness.   Yes [provider]  Multiple Vitamins-Minerals (PRESERVISION  AREDS PO) Take 1 tablet by mouth every morning.   Yes [provider]  omeprazole (PRILOSEC) 20 MG capsule Take 20 mg by mouth daily. 01/20/17  Yes [provider]  ondansetron (ZOFRAN-ODT) 4 MG disintegrating tablet DISSOLVE 1 TABLET IN MOUTH EVERY 8 HOURS AS NEEDED FOR NAUSEA 12/20/17  Yes [provider]  oxyCODONE (OXY IR/ROXICODONE) 5 MG immediate release tablet Take 5 mg by mouth every 4 (four) hours as needed. 11/17/22  Yes [provider]  polyethylene glycol (MIRALAX / GLYCOLAX) packet Take 17 g by mouth daily as needed.   Yes [provider]  prochlorperazine (COMPAZINE) 5 MG tablet Take 5 mg by mouth every 8 (eight) hours as needed. 12/04/16  Yes [provider]  promethazine (PHENERGAN) 25 MG tablet Take 1 tablet (25 mg total) by mouth every 6 (six) hours  as needed for nausea or vomiting. Patient taking differently: Take 12.5 mg by mouth every 6 (six) hours as needed for nausea or vomiting. 04/20/20  Yes Harvest Dark, MD  senna (SENOKOT) 8.6 MG tablet Take 1 tablet by mouth daily as needed for constipation.   Yes [provider]  traZODone (DESYREL) 50 MG tablet Take 50 mg by mouth at bedtime. 01/13/23  Yes [provider]  valACYclovir (VALTREX) 500 MG tablet Take 500 mg by mouth daily. 01/20/17  Yes [provider]  vitamin B-12 (CYANOCOBALAMIN) 500 MCG tablet Take 500 mcg by mouth daily.   Yes [provider]  chlorpheniramine-HYDROcodone (TUSSIONEX) 10-8 MG/5ML Take 5 mLs by mouth every 12 (twelve) hours as needed for cough. Patient not taking: Reported on 02/22/2023 12/14/22   [name], NP  Sodium Fluoride (PREVIDENT 5000 BOOSTER PLUS) 1.1 % PSTE Place onto teeth. 09/28/19   [provider]  TECLISTAMAB-CQYV [place] Inject into the skin.    [provider]    Physical Exam: Vitals:   02/22/23 1253 02/22/23 1723  BP: (!) 172/83 (!) 174/72  Pulse: (!) 102 82  Resp: 17  17  Temp: 98.1 F (36.7 C) 98 F (36.7 C)  SpO2: 92% 94%  Weight:  56.7 kg  Height:  '5\' 3"'$  (1.6 m)   Physical Exam Vitals and nursing note reviewed.  Constitutional:      General: She is not in acute distress.    Appearance: She is normal weight. She is not toxic-appearing.  HENT:     Head: Normocephalic and atraumatic.     Mouth/Throat:     Mouth: Mucous membranes are moist.     Pharynx: Oropharynx is clear.  Eyes:     Conjunctiva/sclera: Conjunctivae normal.     Pupils: Pupils are equal, round, and reactive to light.  Cardiovascular:     Rate and Rhythm: Normal rate and regular rhythm.     Heart sounds: No murmur heard.    No gallop.  Pulmonary:     Effort: Pulmonary effort is normal. No respiratory distress.     Breath sounds: Normal breath sounds. No wheezing, rhonchi or rales.  Abdominal:     General: Bowel sounds are normal. There is no distension.     Palpations: Abdomen is soft.     Tenderness: There is no abdominal tenderness. There is no right CVA tenderness, left CVA tenderness or guarding.  Musculoskeletal:        General: No tenderness or signs of injury.     Right lower leg: No edema.     Left lower leg: No edema.  Skin:    General: Skin is warm and dry.  Neurological:     General: No focal deficit present.     Mental Status: She is alert and oriented to person, place, and time. Mental status is at baseline.  Psychiatric:        Mood and Affect: Mood normal.        Behavior: Behavior normal.    Data Reviewed: CBC with WBC of 7.2, hemoglobin of 11.7 and platelets of 328 CMP with potassium of 3.5, sodium 137, bicarb 21, glucose 128, BUN 11, creatinine 0.77, calcium 8.5, anion gap at 9, alkaline phosphatase of 243, AST 29, ALT 18 and GFR above 60 Lactic acid within normal limits at 1.8 INR within normal limits at 1.0 Urinalysis with large leukocytes, 6-10 RBCs/hpf and over 50 WBC BC/hpf.  No bacteria seen  Urine culture obtained on 02/16/2023 with  Pseudomonas aeruginosa resistant to ciprofloxacin.  And levofloxacin, but sensitive to the rest.  There are no new results to review at this time.  Assessment and Plan:  * Acute cystitis Patient presenting with 6-day history of dysuria and lower abdominal pain, somewhat improved by ciprofloxacin, however urine culture demonstrating Pseudomonas that is resistant to Cipro and levofloxacin.  No CVA tenderness, however given immunocompromise state, will evaluate for pyelonephritis.  - Start cefepime per pharmacy dosing - Repeat urine culture ordered - Blood cultures ordered in the ED - Renal ultrasound ordered  Essential hypertension Patient has a history of hypertension, currently elevated acutely compared to recent PCP visit, likely due to stress.  She is asymptomatic with no evidence of endorgan damage, consistent with severe asymptomatic hypertension.  - Monitor BP during admission.  If improving, hold off on starting maintenance antihypertensives - Labetalol 10 mg as needed for SBP over 180 or DBP over 110  Multiple myeloma (HCC) Patient has history of multiple myeloma currently on IVIG and teclistamab.  Follows with [facility] oncology.  Advance Care Planning:   Code Status: Full Code verified by patient  Consults: None  Family Communication: No family at bedside  Severity of Illness: The appropriate patient status for this patient is INPATIENT. Inpatient status is judged to be reasonable and necessary in order to provide the required intensity of service to ensure the patient's safety. The patient's presenting symptoms, physical exam findings, and initial radiographic and laboratory data in the context of their chronic comorbidities is felt to place them at high risk for further clinical deterioration. Furthermore, it is not anticipated that the patient will be medically stable for discharge from the hospital within 2 midnights of admission.   * I certify that at the point of admission it  is my clinical judgment that the patient will require inpatient hospital care spanning beyond 2 midnights from the point of admission due to high intensity of service, high risk for further deterioration and high frequency of surveillance required.*  Author: Jose Persia, MD 02/22/2023 6:09 PM  For on call review [email or web address].

## 2023-02-22 NOTE — Assessment & Plan Note (Signed)
Patient presenting with 6-day history of dysuria and lower abdominal pain, somewhat improved by ciprofloxacin, however urine culture demonstrating Pseudomonas that is resistant to Cipro and levofloxacin.  No CVA tenderness, however given immunocompromise state, will evaluate for pyelonephritis.  - Start cefepime per pharmacy dosing - Repeat urine culture ordered - Blood cultures ordered in the ED - Renal ultrasound ordered

## 2023-02-22 NOTE — Assessment & Plan Note (Signed)
Patient has history of multiple myeloma currently on IVIG and teclistamab.  Follows with [facility] oncology.

## 2023-02-22 NOTE — Assessment & Plan Note (Signed)
Patient has a history of hypertension, currently elevated acutely compared to recent PCP visit, likely due to stress.  She is asymptomatic with no evidence of endorgan damage, consistent with severe asymptomatic hypertension.  - Monitor BP during admission.  If improving, hold off on starting maintenance antihypertensives - Labetalol 10 mg as needed for SBP over 180 or DBP over 110

## 2023-02-22 NOTE — ED Provider Notes (Signed)
[facility] Provider Note    Event Date/Time   First MD Initiated Contact with Patient 02/22/23 1411     (approximate)   History   No chief complaint on file.   HPI  Sherry Oneal is a 77 y.o. female past medical history significant for multiple myeloma, who presents to the emergency department with symptoms of urinary tract infection.  Patient states that she recently was started on ciprofloxacin for urinary tract infection.  Call today from her primary care provider and told that she needed to come into the emergency department for IV antibiotics.  Ongoing burning with urination.  States that she has been feeling tired and fatigued.  Endorses generalized weakness.  Denies any nausea vomiting or diarrhea.  No abdominal pain.  No falls or trauma.     Physical Exam   Triage Vital Signs: ED Triage Vitals  Enc Vitals Group     BP 02/22/23 1253 (!) 172/83     Pulse Rate 02/22/23 1253 (!) 102     Resp 02/22/23 1253 17     Temp 02/22/23 1253 98.1 F (36.7 C)     Temp src --      SpO2 02/22/23 1253 92 %     Weight --      Height --      Head Circumference --      Peak Flow --      Pain Score 02/22/23 1249 0     Pain Loc --      Pain Edu? --      Excl. in [place]? --     Most recent vital signs: Vitals:   02/22/23 1253  BP: (!) 172/83  Pulse: (!) 102  Resp: 17  Temp: 98.1 F (36.7 C)  SpO2: 92%    Physical Exam Constitutional:      Appearance: She is well-developed.  HENT:     Head: Atraumatic.  Eyes:     Conjunctiva/sclera: Conjunctivae normal.  Cardiovascular:     Rate and Rhythm: Tachycardia present.  Pulmonary:     Effort: No respiratory distress.  Abdominal:     General: There is no distension.     Tenderness: There is no right CVA tenderness or left CVA tenderness.  Musculoskeletal:        General: Normal range of motion.     Cervical back: Normal range of motion.  Skin:    General: Skin is warm.     Capillary Refill: Capillary  refill takes less than 2 seconds.  Neurological:     Mental Status: She is alert. Mental status is at baseline.     IMPRESSION / MDM / ASSESSMENT AND PLAN / ED COURSE  I reviewed the triage vital signs and the nursing notes.  Differential diagnosis including resistant urinary tract infection, sepsis, dehydration  On chart review of outside records patient had a urine culture done on 02/16/2023 that was Pseudomonas sensitive to cefepime and Zosyn.  Not sensitive to levofloxacin or ciprofloxacin.  EKG  Sinus tachycardia while on cardiac telemetry.  RADIOLOGY I independently reviewed imaging, my interpretation of imaging: Chest x-ray with no signs of Pneumonia.  Read as no acute findings  LABS (all labs ordered are listed, but only abnormal results are displayed) Labs interpreted as -    Labs Reviewed  COMPREHENSIVE METABOLIC PANEL - Abnormal; Notable for the following components:      Result Value   CO2 21 (*)    Glucose, Bld 128 (*)  Calcium 8.5 (*)    Alkaline Phosphatase 243 (*)    All other components within normal limits  CBC WITH DIFFERENTIAL/PLATELET - Abnormal; Notable for the following components:   Hemoglobin 11.7 (*)    RDW 16.0 (*)    All other components within normal limits  CULTURE, BLOOD (ROUTINE X 2)  CULTURE, BLOOD (ROUTINE X 2)  LACTIC ACID, PLASMA  PROTIME-INR  URINALYSIS, ROUTINE W REFLEX MICROSCOPIC    TREATMENT  1 L of IV fluids, IV cefepime  MDM    Clinical picture most consistent with resistant urinary tract infection with complicated UTI.  Positive for Pseudomonas on urine culture and sensitive to cefepime.  Patient has penicillin allergy to rash.  Felt that 30 cc/kg of IV fluids may be detrimental to the patient given mild shortness of breath, will give 1 L of IV fluids and reevaluate.    Initial lactic acid normal -do not feel that repeat lactic acid is necessary.  Consulted hospitalist for admission for complicated urinary tract  infection.   PROCEDURES:  Critical Care performed: No  Procedures  Patient's presentation is most consistent with acute presentation with potential threat to life or bodily function.   MEDICATIONS ORDERED IN ED: Medications  sodium chloride 0.9 % bolus 1,000 mL (has no administration in time range)  ceFEPIme (MAXIPIME) 2 g in sodium chloride 0.9 % 100 mL IVPB (has no administration in time range)    FINAL CLINICAL IMPRESSION(S) / ED DIAGNOSES   Final diagnoses:  Acute cystitis without hematuria     Rx / DC Orders   ED Discharge Orders     None        Note:  This document was prepared using Dragon voice recognition software and may include unintentional dictation errors.   Nathaniel Man, MD 02/22/23 1446

## 2023-02-22 NOTE — ED Notes (Signed)
Spoke with [place] in pharmacy to verify running cefepime at y sight with sodium chloride. Ramond Craver stated it was fine to run.

## 2023-02-23 DIAGNOSIS — E876 Hypokalemia: Secondary | ICD-10-CM | POA: Diagnosis not present

## 2023-02-23 DIAGNOSIS — D649 Anemia, unspecified: Secondary | ICD-10-CM

## 2023-02-23 DIAGNOSIS — N3000 Acute cystitis without hematuria: Secondary | ICD-10-CM | POA: Diagnosis not present

## 2023-02-23 DIAGNOSIS — I10 Essential (primary) hypertension: Secondary | ICD-10-CM | POA: Diagnosis not present

## 2023-02-23 DIAGNOSIS — C9001 Multiple myeloma in remission: Secondary | ICD-10-CM | POA: Diagnosis not present

## 2023-02-23 LAB — CBC
HCT: 30 % — ABNORMAL LOW (ref 36.0–46.0)
Hemoglobin: 9.4 g/dL — ABNORMAL LOW (ref 12.0–15.0)
MCH: 30.4 pg (ref 26.0–34.0)
MCHC: 31.3 g/dL (ref 30.0–36.0)
MCV: 97.1 fL (ref 80.0–100.0)
Platelets: 238 10*3/uL (ref 150–400)
RBC: 3.09 MIL/uL — ABNORMAL LOW (ref 3.87–5.11)
RDW: 16.1 % — ABNORMAL HIGH (ref 11.5–15.5)
WBC: 3.9 10*3/uL — ABNORMAL LOW (ref 4.0–10.5)
nRBC: 0 % (ref 0.0–0.2)

## 2023-02-23 LAB — BASIC METABOLIC PANEL
Anion gap: 6 (ref 5–15)
BUN: 9 mg/dL (ref 8–23)
CO2: 24 mmol/L (ref 22–32)
Calcium: 7.6 mg/dL — ABNORMAL LOW (ref 8.9–10.3)
Chloride: 110 mmol/L (ref 98–111)
Creatinine, Ser: 0.71 mg/dL (ref 0.44–1.00)
GFR, Estimated: >60 mL/min (ref >60)
Glucose, Bld: 95 mg/dL (ref 70–99)
Potassium: 3.4 mmol/L — ABNORMAL LOW (ref 3.5–5.1)
Sodium: 140 mmol/L (ref 135–145)

## 2023-02-23 NOTE — Progress Notes (Signed)
PROGRESS NOTE  Sherry Oneal [id number] DOB: 28-Nov-1946   PCP: [name], NP  Patient is from: Home.  DOA: 02/22/2023 LOS: 1  Chief complaints Abnormal urine culture    Brief Narrative / Interim history: 77 year old F with PMH of MM on Teclistamab and IVIG, HTN, HLD and macular degeneration directed to ED by PCP due to Pseudomonas UTI resistant to quinolone.  Patient had dysuria and lower abdominal pain about 6 days ago and saw PCP.  At that time, urine culture obtained and she was started on Cipro.  However, urine culture with Pseudomonas resistant to quinolones.  So she was advised to go to ED for IV antibiotics.  Patient reports improvement in dysuria.  No constitutional symptoms, hematuria or signs of pyelonephritis.   In ED, slightly tachycardic with slightly elevated BP.  WBC 7.2.  Lactic acid normal.  UA with large LE today.  Blood and urine cultures obtained.  Started on IV cefepime based on culture sensitivity from PCP office..    Subjective: Seen and examined earlier this morning.  No major events overnight of this morning.  Continues to endorse intermittent dysuria.  No frequency, urgency, abdominal pain or new back pain.  Denies nausea or vomiting.  Objective: Vitals:   02/22/23 1723 02/22/23 1947 02/23/23 0414 02/23/23 0736  BP: (!) 174/72 (!) 146/64 (!) 132/57 (!) 141/61  Pulse: 82 83 72 78  Resp: '17 20 20 18  '$ Temp: 98 F (36.7 C) (!) 97.5 F (36.4 C) 97.8 F (36.6 C) 98.4 F (36.9 C)  TempSrc: Oral Oral Oral Oral  SpO2: 94% 93% 94% 94%  Weight: 56.7 kg     Height: '5\' 3"'$  (1.6 m)       Examination:  GENERAL: No apparent distress.  Nontoxic. HEENT: MMM.  Vision and hearing grossly intact.  NECK: Supple.  No apparent JVD.  RESP:  No IWOB.  Fair aeration bilaterally. CVS:  RRR. Heart sounds normal.  ABD/GI/GU: BS+. Abd soft, NTND.  No CVA tenderness. MSK/EXT:  Moves extremities. No apparent deformity. No edema.  SKIN: no apparent skin lesion or  wound NEURO: Awake, alert and oriented appropriately.  No apparent focal neuro deficit. PSYCH: Calm. Normal affect.   Procedures:  None  Microbiology summarized: Blood cultures NGTD Urine culture pending  Assessment and plan: Principal Problem:   Acute cystitis Active Problems:   Multiple myeloma ([place])   Essential hypertension  Acute cystitis without hematuria: Immunocompromised patient.  started on Cipro outpatient but urine culture at PCP office with Pseudomonas resistant to quinolone.  Still with some intermittent dysuria but no constitutional symptoms.  Low suspicion for pyelonephritis.  Renal ultrasound reassuring.  Blood cultures negative.  -Continue IV cefepime pending urine culture -Discuss with pharmacy or ID if fosfomycin would be an option prior to discharge   Essential hypertension: BP improved.  Does not seem to be on medication at home. -Labetalol as needed   Multiple myeloma ([place]): IVIG and teclistamab.  Follows with [facility] oncology. -Continue lactic dapsone and Valtrex -Outpatient follow-up with oncology.  Hypokalemia -Monitor replenish as appropriate  Normocytic anemia: Patient denies melena or hematochezia.  No evidence of bleeding. Recent Labs    02/22/23 1328 02/23/23 0356  HGB 11.7* 9.4*  -Take anemia panel in the morning -Recheck CBC in the morning   Body mass index is 22.14 kg/m.          DVT prophylaxis:  enoxaparin (LOVENOX) injection 40 mg Start: 02/22/23 2200  Code Status: Full code Family Communication: None  at bedside Level of care: Med-Surg Status is: Inpatient Remains inpatient appropriate because: Acute cystitis requiring IV antibiotics.   Final disposition: Home once medically stable Consultants:  None  35 minutes with more than 50% spent in reviewing records, counseling patient/family and coordinating care.   Sch Meds:  Scheduled Meds:  atorvastatin  10 mg Oral Daily   dapsone  100 mg Oral Daily   enoxaparin  (LOVENOX) injection  40 mg Subcutaneous Q24H   traZODone  50 mg Oral QHS   valACYclovir  500 mg Oral Daily   Continuous Infusions:  ceFEPime (MAXIPIME) IV 2 g (02/23/23 0308)   PRN Meds:.acetaminophen **OR** acetaminophen, ALPRAZolam, dicyclomine, ipratropium-albuterol, labetalol, ondansetron **OR** ondansetron (ZOFRAN) IV  Antimicrobials: Anti-infectives (From admission, onward)    Start     Dose/Rate Route Frequency Ordered Stop   02/23/23 1000  dapsone tablet 100 mg        100 mg Oral Daily 02/22/23 2131     02/23/23 1000  valACYclovir (VALTREX) tablet 500 mg        500 mg Oral Daily 02/22/23 2131     02/23/23 0300  ceFEPIme (MAXIPIME) 2 g in sodium chloride 0.9 % 100 mL IVPB        2 g 200 mL/hr over 30 Minutes Intravenous Every 12 hours 02/22/23 1604     02/22/23 1445  ceFEPIme (MAXIPIME) 2 g in sodium chloride 0.9 % 100 mL IVPB        2 g 200 mL/hr over 30 Minutes Intravenous  Once 02/22/23 1440 02/22/23 1549        I have personally reviewed the following labs and images: CBC: Recent Labs  Lab 02/22/23 1328 02/23/23 0356  WBC 7.2 3.9*  NEUTROABS 4.0  --   HGB 11.7* 9.4*  HCT 36.9 30.0*  MCV 95.3 97.1  PLT 328 238   BMP &GFR Recent Labs  Lab 02/22/23 1328 02/23/23 0356  NA 137 140  K 3.5 3.4*  CL 107 110  CO2 21* 24  GLUCOSE 128* 95  BUN 11 9  CREATININE 0.77 0.71  CALCIUM 8.5* 7.6*   Estimated Creatinine Clearance: 49.5 mL/min (by C-G formula based on SCr of 0.71 mg/dL). Liver & Pancreas: Recent Labs  Lab 02/22/23 1328  AST 29  ALT 18  ALKPHOS 243*  BILITOT 0.8  PROT 6.6  ALBUMIN 3.5   No results for input(s): "LIPASE", "AMYLASE" in the last 168 hours. No results for input(s): "AMMONIA" in the last 168 hours. Diabetic: No results for input(s): "HGBA1C" in the last 72 hours. No results for input(s): "GLUCAP" in the last 168 hours. Cardiac Enzymes: No results for input(s): "CKTOTAL", "CKMB", "CKMBINDEX", "TROPONINI" in the last 168  hours. No results for input(s): "PROBNP" in the last 8760 hours. Coagulation Profile: Recent Labs  Lab 02/22/23 1328  INR 1.0   Thyroid Function Tests: No results for input(s): "TSH", "T4TOTAL", "FREET4", "T3FREE", "THYROIDAB" in the last 72 hours. Lipid Profile: No results for input(s): "CHOL", "HDL", "LDLCALC", "TRIG", "CHOLHDL", "LDLDIRECT" in the last 72 hours. Anemia Panel: No results for input(s): "VITAMINB12", "FOLATE", "FERRITIN", "TIBC", "IRON", "RETICCTPCT" in the last 72 hours. Urine analysis:    Component Value Date/Time   COLORURINE YELLOW (A) 02/22/2023 1439   APPEARANCEUR CLEAR (A) 02/22/2023 1439   APPEARANCEUR Clear 10/16/2022 1115   LABSPEC 1.009 02/22/2023 1439   LABSPEC 1.025 01/06/2014 1152   PHURINE 5.0 02/22/2023 1439   GLUCOSEU NEGATIVE 02/22/2023 1439   GLUCOSEU Negative 01/06/2014 1152   HGBUR NEGATIVE  02/22/2023 [place] 02/22/2023 1439   BILIRUBINUR Negative 02/16/2023 1106   BILIRUBINUR Negative 10/16/2022 1115   BILIRUBINUR Negative 01/06/2014 1152   KETONESUR NEGATIVE 02/22/2023 1439   PROTEINUR NEGATIVE 02/22/2023 1439   UROBILINOGEN 0.2 02/16/2023 1106   NITRITE NEGATIVE 02/22/2023 1439   LEUKOCYTESUR LARGE (A) 02/22/2023 1439   LEUKOCYTESUR Negative 01/06/2014 1152   Sepsis Labs: Invalid input(s): "PROCALCITONIN", "LACTICIDVEN"  Microbiology: Recent Results (from the past 240 hour(s))  CULTURE, URINE COMPREHENSIVE     Status: Abnormal   Collection Time: 02/16/23  3:58 PM   Specimen: Urine   Urine  Result Value Ref Range Status   Urine Culture, Comprehensive Final report (A)  Final   Organism ID, Bacteria Comment (A)  Final    Comment: Pseudomonas aeruginosa 10,000-25,000 colony forming units per mL    Organism ID, Bacteria Not applicable  Final   ANTIMICROBIAL SUSCEPTIBILITY Comment  Final    Comment:       ** S = Susceptible; I = Intermediate; R = Resistant **                    P = Positive; N = Negative              MICS are expressed in micrograms per mL    Antibiotic                 RSLT#1    RSLT#2    RSLT#3    RSLT#4 Amikacin                       S Cefepime                       S Ceftazidime                    S Ciprofloxacin                  R Gentamicin                     S Imipenem                       S Levofloxacin                   R Meropenem                      S Piperacillin                   S Ticarcillin                    S Tobramycin                     S   Culture, blood (Routine x 2)     Status: None (Preliminary result)   Collection Time: 02/22/23  1:28 PM   Specimen: BLOOD RIGHT HAND  Result Value Ref Range Status   Specimen Description BLOOD RIGHT HAND  Final   Special Requests   Final    BOTTLES DRAWN AEROBIC AND ANAEROBIC Blood Culture adequate volume   Culture   Final    NO GROWTH < 24 HOURS Performed at [facility], [address] [place], [place] [address]    Report Status PENDING  Incomplete  Culture, blood (Routine x  2)     Status: None (Preliminary result)   Collection Time: 02/22/23  1:36 PM   Specimen: BLOOD LEFT HAND  Result Value Ref Range Status   Specimen Description BLOOD LEFT HAND  Final   Special Requests   Final    BOTTLES DRAWN AEROBIC AND ANAEROBIC Blood Culture adequate volume   Culture   Final    NO GROWTH < 24 HOURS Performed at [facility], [address] [place], [place] [address]    Report Status PENDING  Incomplete    Radiology Studies: US RENAL  Result Date: 02/22/2023 CLINICAL DATA:  One-week history of urinary tract infection for evaluation of pyelonephritis EXAM: RENAL / URINARY TRACT ULTRASOUND COMPLETE COMPARISON:  CT abdomen and pelvis dated 10/20/2022 FINDINGS: Right Kidney: Length = 9.3 cm AP renal pelvis diameter = <10 mm Normal parenchymal echogenicity with preserved corticomedullary differentiation. Right upper pole simple cyst measures 1.4 x 1.2 x 0.9 cm. No specific follow-up imaging  recommended. No urinary tract dilation or shadowing calculi. The ureter is not seen. Left Kidney: Length = 8.1 cm AP renal pelvis diameter = <10 mm Multifocal renal cortical scarring. Normal parenchymal echogenicity with preserved corticomedullary differentiation. No urinary tract dilation. Cortical calcification versus stone measures 6 mm in the interpolar region. The ureter is not seen. Bladder: Appears normal for degree of bladder distention. Other: None. IMPRESSION: 1. Normal renal parenchymal echogenicity without new focal abnormalities to suggest pyelonephritis. No urinary tract dilation. 2. Multifocal renal cortical scarring of the left kidney. 6 mm left interpolar cortical calcification versus stone. Electronically Signed   By: Darrin Nipper M.D.   On: 02/22/2023 17:19   DG Chest 2 View  Result Date: 02/22/2023 CLINICAL DATA:  Suspected sepsis, UTI, worsening symptoms, dehydration, hypertension, multiple myeloma EXAM: CHEST - 2 VIEW COMPARISON:  03/23/2021 FINDINGS: Normal heart size, mediastinal contours, and pulmonary vascularity. Atherosclerotic calcification aorta. Lungs hyperinflated but clear. No acute infiltrate, pleural effusion, or pneumothorax. Diffuse osseous demineralization. IMPRESSION: No acute abnormalities. Aortic Atherosclerosis (ICD10-I70.0). Electronically Signed   By: Lavonia Dana M.D.   On: 02/22/2023 14:14      Betsy Rosello T. [place]  If 7PM-7AM, please contact night-coverage [email or web address] 02/23/2023, 11:20 AM

## 2023-02-23 NOTE — Progress Notes (Signed)
Bedside report received. Patient denies any pain at this time.

## 2023-02-23 NOTE — Progress Notes (Signed)
Mobility Specialist - Progress Note   02/23/23 1031  Mobility  Activity Dangled on edge of bed  Level of Assistance Independent after set-up  Assistive Device None  Activity Response Tolerated well  $Mobility charge 1 Mobility   Pt supine upon entry, utilizing RA. Pt initially requesting to sit in recliner. Pt transferred to the EOB indep. Once seated EOB, Pt denied OOB amb to the recliner and returned supine. Pt left supine with needs within reach, no complaints.   Candie Mile Mobility Specialist 02/23/23 10:38 AM

## 2023-02-24 DIAGNOSIS — N3000 Acute cystitis without hematuria: Secondary | ICD-10-CM | POA: Diagnosis not present

## 2023-02-24 DIAGNOSIS — C9001 Multiple myeloma in remission: Secondary | ICD-10-CM | POA: Diagnosis not present

## 2023-02-24 DIAGNOSIS — E876 Hypokalemia: Secondary | ICD-10-CM | POA: Diagnosis not present

## 2023-02-24 DIAGNOSIS — I10 Essential (primary) hypertension: Secondary | ICD-10-CM | POA: Diagnosis not present

## 2023-02-24 LAB — IRON AND TIBC
Iron: 73 ug/dL (ref 28–170)
Saturation Ratios: 21 % (ref 10.4–31.8)
TIBC: 350 ug/dL (ref 250–450)
UIBC: 277 ug/dL

## 2023-02-24 LAB — RETICULOCYTES
Immature Retic Fract: 33.3 % — ABNORMAL HIGH (ref 2.3–15.9)
RBC.: 3.12 MIL/uL — ABNORMAL LOW (ref 3.87–5.11)
Retic Count, Absolute: 133.2 10*3/uL (ref 19.0–186.0)
Retic Ct Pct: 4.3 % — ABNORMAL HIGH (ref 0.4–3.1)

## 2023-02-24 LAB — RENAL FUNCTION PANEL
Albumin: 2.9 g/dL — ABNORMAL LOW (ref 3.5–5.0)
Anion gap: 4 — ABNORMAL LOW (ref 5–15)
BUN: 14 mg/dL (ref 8–23)
CO2: 28 mmol/L (ref 22–32)
Calcium: 8.5 mg/dL — ABNORMAL LOW (ref 8.9–10.3)
Chloride: 105 mmol/L (ref 98–111)
Creatinine, Ser: 0.82 mg/dL (ref 0.44–1.00)
GFR, Estimated: >60 mL/min (ref >60)
Glucose, Bld: 103 mg/dL — ABNORMAL HIGH (ref 70–99)
Phosphorus: 4.1 mg/dL (ref 2.5–4.6)
Potassium: 3.6 mmol/L (ref 3.5–5.1)
Sodium: 137 mmol/L (ref 135–145)

## 2023-02-24 LAB — CBC
HCT: 30.8 % — ABNORMAL LOW (ref 36.0–46.0)
Hemoglobin: 9.7 g/dL — ABNORMAL LOW (ref 12.0–15.0)
MCH: 30.3 pg (ref 26.0–34.0)
MCHC: 31.5 g/dL (ref 30.0–36.0)
MCV: 96.3 fL (ref 80.0–100.0)
Platelets: 235 10*3/uL (ref 150–400)
RBC: 3.2 MIL/uL — ABNORMAL LOW (ref 3.87–5.11)
RDW: 16.3 % — ABNORMAL HIGH (ref 11.5–15.5)
WBC: 4.2 10*3/uL (ref 4.0–10.5)
nRBC: 0 % (ref 0.0–0.2)

## 2023-02-24 LAB — VITAMIN B12: Vitamin B-12: 653 pg/mL (ref 180–914)

## 2023-02-24 LAB — MAGNESIUM: Magnesium: 2.1 mg/dL (ref 1.7–2.4)

## 2023-02-24 LAB — FERRITIN: Ferritin: 28 ng/mL (ref 11–307)

## 2023-02-24 LAB — FOLATE: Folate: 7.3 ng/mL (ref >5.9)

## 2023-02-24 NOTE — TOC CM/SW Note (Signed)
  Transition of Care [facility]) Screening Note   Patient Details  Name: Sherry Oneal Date of Birth: 03/02/1946   Transition of Care [facility]) CM/SW Contact:    Candie Chroman, LCSW Phone Number: 02/24/2023, 8:26 AM    Transition of Care Department [facility]) has reviewed patient and no TOC needs have been identified at this time. We will continue to monitor patient advancement through interdisciplinary progression rounds. If new patient transition needs arise, please place a TOC consult.

## 2023-02-24 NOTE — Progress Notes (Signed)
PROGRESS NOTE  LACREASHA JUMA [id number] DOB: [birth year]   PCP: [name], NP  Patient is from: Home.  DOA: 02/22/2023 LOS: 2  Chief complaints Abnormal urine culture    Brief Narrative / Interim history: 77 year old F with PMH of MM on Teclistamab and IVIG, HTN, HLD and macular degeneration directed to ED by PCP due to Pseudomonas UTI resistant to quinolone.  Patient had dysuria and lower abdominal pain about 6 days ago and saw PCP.  At that time, urine culture obtained and she was started on Cipro.  However, urine culture with Pseudomonas resistant to quinolones.  So she was advised to go to ED for IV antibiotics.  Patient reports improvement in dysuria.  No constitutional symptoms, hematuria or signs of pyelonephritis.   In ED, slightly tachycardic with slightly elevated BP.  WBC 7.2.  Lactic acid normal.  UA with large LE today.  Blood and urine cultures obtained.  Started on IV cefepime based on culture sensitivity from PCP office.  Urine culture here with GNR.   Subjective: Seen and examined earlier this morning.  No major events overnight of this morning.  Dysuria has resolved.  She has left-sided back pain.  No other complaints.  Objective: Vitals:   02/23/23 1948 02/24/23 0332 02/24/23 0756 02/24/23 1515  BP: 139/64 (!) 139/58 (!) 158/66 123/67  Pulse: 78 75 72 86  Resp: '18 18 17 18  '$ Temp: 98 F (36.7 C) 98.3 F (36.8 C) 97.8 F (36.6 C) 98 F (36.7 C)  TempSrc: Oral Oral Oral Oral  SpO2: 93% 92% 93% 93%  Weight:      Height:        Examination:  GENERAL: No apparent distress.  Nontoxic. HEENT: MMM.  Vision and hearing grossly intact.  NECK: Supple.  No apparent JVD.  RESP:  No IWOB.  Fair aeration bilaterally. CVS:  RRR. Heart sounds normal.  ABD/GI/GU: BS+. Abd soft, NTND.  No CVA tenderness. MSK/EXT:   No apparent deformity.  Mild tenderness over left lumbosacral region. SKIN: no apparent skin lesion or wound NEURO: Awake and alert. Oriented  appropriately.  No apparent focal neuro deficit. PSYCH: Calm. Normal affect.   Procedures:  None  Microbiology summarized: Blood cultures NGTD Urine culture pending  Assessment and plan: Principal Problem:   Acute cystitis Active Problems:   Multiple myeloma ([place])   Essential hypertension  Acute cystitis without hematuria: Immunocompromised patient.  started on Cipro outpatient but urine culture at PCP office with Pseudomonas resistant to quinolone.  Still with some intermittent dysuria but no constitutional symptoms.  Low suspicion for pyelonephritis.  Renal US reassuring.  Blood cultures negative.  Urine culture with GNR. -Continue IV cefepime pending urine culture -Discussed with ID pharmacy, could consider a dose of tobramycin to complete treatment course after culture result.   Essential hypertension: Improved and normotensive. -Labetalol as needed   Multiple myeloma ([place]): IVIG and teclistamab.  Follows with [facility] oncology. -Continue prophylactic dapsone and Valtrex -Outpatient follow-up with oncology.  Hypokalemia -Monitor replenish as appropriate  Normocytic anemia: Stable after initial drop.  Denies melena or hematochezia.  Ferritin 28 suggesting mild iron deficiency. Recent Labs    02/22/23 1328 02/23/23 0356 02/24/23 0424  HGB 11.7* 9.4* 9.7*    Body mass index is 22.14 kg/m.          DVT prophylaxis:  enoxaparin (LOVENOX) injection 40 mg Start: 02/22/23 2200  Code Status: Full code Family Communication: None at bedside Level of care: Med-Surg Status is: Inpatient Remains  inpatient appropriate because: Acute cystitis requiring IV antibiotics.   Final disposition: Home once medically stable Consultants:  None  35 minutes with more than 50% spent in reviewing records, counseling patient/family and coordinating care.   Sch Meds:  Scheduled Meds:  atorvastatin  10 mg Oral Daily   dapsone  100 mg Oral Daily   enoxaparin (LOVENOX) injection  40  mg Subcutaneous Q24H   traZODone  50 mg Oral QHS   valACYclovir  500 mg Oral Daily   Continuous Infusions:  ceFEPime (MAXIPIME) IV 2 g (02/24/23 1508)   PRN Meds:.acetaminophen **OR** acetaminophen, ALPRAZolam, dicyclomine, ipratropium-albuterol, labetalol, ondansetron **OR** ondansetron (ZOFRAN) IV  Antimicrobials: Anti-infectives (From admission, onward)    Start     Dose/Rate Route Frequency Ordered Stop   02/23/23 1000  dapsone tablet 100 mg        100 mg Oral Daily 02/22/23 2131     02/23/23 1000  valACYclovir (VALTREX) tablet 500 mg        500 mg Oral Daily 02/22/23 2131     02/23/23 0300  ceFEPIme (MAXIPIME) 2 g in sodium chloride 0.9 % 100 mL IVPB        2 g 200 mL/hr over 30 Minutes Intravenous Every 12 hours 02/22/23 1604     02/22/23 1445  ceFEPIme (MAXIPIME) 2 g in sodium chloride 0.9 % 100 mL IVPB        2 g 200 mL/hr over 30 Minutes Intravenous  Once 02/22/23 1440 02/22/23 1549        I have personally reviewed the following labs and images: CBC: Recent Labs  Lab 02/22/23 1328 02/23/23 0356 02/24/23 0424  WBC 7.2 3.9* 4.2  NEUTROABS 4.0  --   --   HGB 11.7* 9.4* 9.7*  HCT 36.9 30.0* 30.8*  MCV 95.3 97.1 96.3  PLT 328 238 235   BMP &GFR Recent Labs  Lab 02/22/23 1328 02/23/23 0356 02/24/23 0424  NA 137 140 137  K 3.5 3.4* 3.6  CL 107 110 105  CO2 21* 24 28  GLUCOSE 128* 95 103*  BUN '11 9 14  '$ CREATININE 0.77 0.71 0.82  CALCIUM 8.5* 7.6* 8.5*  MG  --   --  2.1  PHOS  --   --  4.1   Estimated Creatinine Clearance: 48.3 mL/min (by C-G formula based on SCr of 0.82 mg/dL). Liver & Pancreas: Recent Labs  Lab 02/22/23 1328 02/24/23 0424  AST 29  --   ALT 18  --   ALKPHOS 243*  --   BILITOT 0.8  --   PROT 6.6  --   ALBUMIN 3.5 2.9*   No results for input(s): "LIPASE", "AMYLASE" in the last 168 hours. No results for input(s): "AMMONIA" in the last 168 hours. Diabetic: No results for input(s): "HGBA1C" in the last 72 hours. No results for  input(s): "GLUCAP" in the last 168 hours. Cardiac Enzymes: No results for input(s): "CKTOTAL", "CKMB", "CKMBINDEX", "TROPONINI" in the last 168 hours. No results for input(s): "PROBNP" in the last 8760 hours. Coagulation Profile: Recent Labs  Lab 02/22/23 1328  INR 1.0   Thyroid Function Tests: No results for input(s): "TSH", "T4TOTAL", "FREET4", "T3FREE", "THYROIDAB" in the last 72 hours. Lipid Profile: No results for input(s): "CHOL", "HDL", "LDLCALC", "TRIG", "CHOLHDL", "LDLDIRECT" in the last 72 hours. Anemia Panel: Recent Labs    02/24/23 0424  VITAMINB12 653  FOLATE 7.3  FERRITIN 28  TIBC 350  IRON 73  RETICCTPCT 4.3*   Urine analysis:  Component Value Date/Time   COLORURINE YELLOW (A) 02/22/2023 1439   APPEARANCEUR CLEAR (A) 02/22/2023 1439   APPEARANCEUR Clear 10/16/2022 1115   LABSPEC 1.009 02/22/2023 1439   LABSPEC 1.025 01/06/2014 1152   PHURINE 5.0 02/22/2023 1439   GLUCOSEU NEGATIVE 02/22/2023 1439   GLUCOSEU Negative 01/06/2014 1152   HGBUR NEGATIVE 02/22/2023 1439   BILIRUBINUR NEGATIVE 02/22/2023 1439   BILIRUBINUR Negative 02/16/2023 1106   BILIRUBINUR Negative 10/16/2022 1115   BILIRUBINUR Negative 01/06/2014 [place] 02/22/2023 1439   PROTEINUR NEGATIVE 02/22/2023 1439   UROBILINOGEN 0.2 02/16/2023 1106   NITRITE NEGATIVE 02/22/2023 1439   LEUKOCYTESUR LARGE (A) 02/22/2023 1439   LEUKOCYTESUR Negative 01/06/2014 1152   Sepsis Labs: Invalid input(s): "PROCALCITONIN", "LACTICIDVEN"  Microbiology: Recent Results (from the past 240 hour(s))  CULTURE, URINE COMPREHENSIVE     Status: Abnormal   Collection Time: 02/16/23  3:58 PM   Specimen: Urine   Urine  Result Value Ref Range Status   Urine Culture, Comprehensive Final report (A)  Final   Organism ID, Bacteria Comment (A)  Final    Comment: Pseudomonas aeruginosa 10,000-25,000 colony forming units per mL    Organism ID, Bacteria Not applicable  Final   ANTIMICROBIAL  SUSCEPTIBILITY Comment  Final    Comment:       ** S = Susceptible; I = Intermediate; R = Resistant **                    P = Positive; N = Negative             MICS are expressed in micrograms per mL    Antibiotic                 RSLT#1    RSLT#2    RSLT#3    RSLT#4 Amikacin                       S Cefepime                       S Ceftazidime                    S Ciprofloxacin                  R Gentamicin                     S Imipenem                       S Levofloxacin                   R Meropenem                      S Piperacillin                   S Ticarcillin                    S Tobramycin                     S   Culture, blood (Routine x 2)     Status: None (Preliminary result)   Collection Time: 02/22/23  1:28 PM   Specimen: BLOOD RIGHT HAND  Result Value Ref Range Status   Specimen Description BLOOD RIGHT HAND  Final  Special Requests   Final    BOTTLES DRAWN AEROBIC AND ANAEROBIC Blood Culture adequate volume   Culture   Final    NO GROWTH 2 DAYS Performed at [facility], [address] [place], [place] [address]    Report Status PENDING  Incomplete  Culture, blood (Routine x 2)     Status: None (Preliminary result)   Collection Time: 02/22/23  1:36 PM   Specimen: BLOOD LEFT HAND  Result Value Ref Range Status   Specimen Description BLOOD LEFT HAND  Final   Special Requests   Final    BOTTLES DRAWN AEROBIC AND ANAEROBIC Blood Culture adequate volume   Culture   Final    NO GROWTH 2 DAYS Performed at [facility], [address], [place], [place] [address]    Report Status PENDING  Incomplete  Urine Culture (for pregnant, neutropenic or urologic patients or patients with an indwelling urinary catheter)     Status: Abnormal (Preliminary result)   Collection Time: 02/22/23  2:39 PM   Specimen: Urine, Clean Catch  Result Value Ref Range Status   Specimen Description   Final    URINE, CLEAN CATCH Performed at [facility], [address], [place], [place] [address]    Special Requests   Final    NONE Performed at [facility], [address], [place], [place] [address]    Culture (A)  Final    20,000 COLONIES/mL PSEUDOMONAS AERUGINOSA SUSCEPTIBILITIES TO FOLLOW Performed at [facility], [place] [address], [place], [place] [address]    Report Status PENDING  Incomplete    Radiology Studies: No results found.    Kalan Yeley T. Alex[place]  If 7PM-7AM, please contact night-coverage [email or web address] 02/24/2023, 4:17 PM

## 2023-02-25 DIAGNOSIS — N3001 Acute cystitis with hematuria: Secondary | ICD-10-CM

## 2023-02-25 DIAGNOSIS — D649 Anemia, unspecified: Secondary | ICD-10-CM | POA: Insufficient documentation

## 2023-02-25 LAB — URINE CULTURE: Culture: 20000 — AB

## 2023-02-25 MED ORDER — TOBRAMYCIN SULFATE 80 MG/2ML IJ SOLN
5.0000 mg/kg | Freq: Once | INTRAVENOUS | Status: AC
  Administered 2023-02-25: 280 mg via INTRAVENOUS
  Filled 2023-02-25: qty 7

## 2023-02-25 NOTE — Discharge Summary (Signed)
Physician Discharge Summary  Sherry Oneal [id number] DOB: 1946/11/13 DOA: 02/22/2023  PCP: [name], NP  Admit date: 02/22/2023 Discharge date: 02/25/2023 Admitted From: Home Disposition: Home Recommendations for Outpatient Follow-up:  Follow up with PCP in 1 week Outpatient follow-up with oncology Check CBC and BMP at follow-up Please follow up on the following pending results: None  Home Health: Not indicated Equipment/Devices: Not indicated  Discharge Condition: Stable CODE STATUS: Full code  Follow-up Information     [name], NP. Go on 03/04/2023.   Specialty: Nurse Practitioner Why: Appointment on 03/04/23 @ 10:40 am Contact information: [place] [address] [phone]                 Hospital course 77 year old F with PMH of MM on Teclistamab and IVIG, HTN, HLD and macular degeneration directed to ED by PCP due to Pseudomonas UTI resistant to quinolone.  Patient had dysuria and lower abdominal pain about 6 days ago and saw PCP.  At that time, urine culture obtained and she was started on Cipro.  However, urine culture with Pseudomonas resistant to quinolones.  So she was advised to go to ED for IV antibiotics.  Patient reports improvement in dysuria.  No constitutional symptoms, hematuria or signs of pyelonephritis.   In ED, slightly tachycardic with slightly elevated BP.  WBC 7.2.  Lactic acid normal.  UA with large LE today.  Blood and urine cultures obtained.  Started on IV cefepime based on culture sensitivity from PCP office.  Urine culture with Pseudomonas aeruginosa resistant to ciprofloxacin.  Patient received IV cefepime for 3 days.  UTI symptoms/dysuria resolved.  After discussion with Pharm ID, she received IV tobramycin x 1 to complete antibiotic course, and discharged home.  See individual problem list below for more.   Problems addressed during this hospitalization Principal Problem:   Acute cystitis with  hematuria Active Problems:   Multiple myeloma ([name])   Essential hypertension   Normocytic anemia   Hypokalemia: resolved           Vital signs Vitals:   02/24/23 0756 02/24/23 1515 02/24/23 1927 02/25/23 0331  BP: (!) 158/66 123/67 (!) 131/58 (!) 134/53  Pulse: 72 86 79 70  Temp: 97.8 F (36.6 C) 98 F (36.7 C) 97.9 F (36.6 C) 97.8 F (36.6 C)  Resp: '17 18 18 18  '$ Height:      Weight:      SpO2: 93% 93% 93% 96%  TempSrc: Oral Oral    BMI (Calculated):         Discharge exam  GENERAL: No apparent distress.  Nontoxic. HEENT: MMM.  Vision and hearing grossly intact.  NECK: Supple.  No apparent JVD.  RESP:  No IWOB.  Fair aeration bilaterally. CVS:  RRR. Heart sounds normal.  ABD/GI/GU: BS+. Abd soft, NTND.  MSK/EXT:  Moves extremities. No apparent deformity. No edema.  SKIN: no apparent skin lesion or wound NEURO: Awake and alert. Oriented appropriately.  No apparent focal neuro deficit. PSYCH: Calm. Normal affect.   Discharge Instructions Discharge Instructions     Call MD for:  temperature >100.4   Complete by: As directed    Diet general   Complete by: As directed    Discharge instructions   Complete by: As directed    It has been a pleasure taking care of you!  You were hospitalized due to urinary tract infection for which you have been treated with IV antibiotics.  Follow-up with your primary care  doctor 1 to 2 weeks or sooner if needed.  Follow-up with your oncologist per their recommendation.   Take care,   Increase activity slowly   Complete by: As directed       Allergies as of 02/25/2023       Reactions   Aspirin Other (See Comments)   Causes internal bleeding per patient   Atenolol    "wierd feeling"   Doxycycline    Upset stomach   Epinephrine    "Shaking"   Ivp Dye [iodinated Contrast Media] Hypertension   Per tech conversation with PT, pt states that the last time she had IV dye she had increased BP and tachycardia.      Ketorolac     Nsaids    Bleeding   Penicillins    rash   Sulfa Antibiotics    Rash   Tolmetin    Bleeding   Tramadol    Numb feeling   Procaine Palpitations   States this was added due to palpitations and shakes after receiving local anesthetic containing epinephrine.        Medication List     STOP taking these medications    chlorpheniramine-HYDROcodone 10-8 MG/5ML Commonly known as: TUSSIONEX   ciprofloxacin 500 MG tablet Commonly known as: Cipro       TAKE these medications    acetaminophen 500 MG tablet Commonly known as: TYLENOL Take 650 mg by mouth in the morning and at bedtime.   ALPRAZolam 0.25 MG tablet Commonly known as: XANAX Take 1 tablet (0.25 mg total) by mouth 2 (two) times daily as needed for anxiety or sleep.   atorvastatin 10 MG tablet Commonly known as: LIPITOR Take 1 tablet by mouth once daily   Calcium Carbonate-Vitamin D 600-400 MG-UNIT tablet Take 1 tablet by mouth daily.   chlorhexidine 0.12 % solution Commonly known as: PERIDEX Swish and spit 15 ml once or twice daily to prevent oral/dental infections   Cranberry 500 MG Caps Take 500 mg by mouth every morning.   dapsone 100 MG tablet Take 100 mg by mouth daily.   dexamethasone 4 MG tablet Commonly known as: DECADRON Take 20 mg by mouth daily.   dicyclomine 10 MG capsule Commonly known as: BENTYL TAKE 1 CAPSULE BY MOUTH THREE TIMES DAILY AS NEEDED  FOR SPASMS  TAKE WITH MEALS   diphenhydrAMINE 25 MG tablet Commonly known as: BENADRYL Take 25 mg by mouth daily as needed.   docusate 50 MG/5ML liquid Commonly known as: COLACE Take by mouth daily as needed for mild constipation.   fluticasone 50 MCG/ACT nasal spray Commonly known as: FLONASE Use 2 spray(s) in each nostril once daily   ipratropium-albuterol 0.5-2.5 (3) MG/3ML Soln Commonly known as: DUONEB Take 3 mLs by nebulization every 6 (six) hours as needed.   meclizine 25 MG tablet Commonly known as: ANTIVERT Take 25  mg by mouth as needed for dizziness.   omeprazole 20 MG capsule Commonly known as: PRILOSEC Take 20 mg by mouth daily.   ondansetron 4 MG disintegrating tablet Commonly known as: ZOFRAN-ODT DISSOLVE 1 TABLET IN MOUTH EVERY 8 HOURS AS NEEDED FOR NAUSEA   oxyCODONE 5 MG immediate release tablet Commonly known as: Oxy IR/ROXICODONE Take 5 mg by mouth every 4 (four) hours as needed.   polyethylene glycol 17 g packet Commonly known as: MIRALAX / GLYCOLAX Take 17 g by mouth daily as needed.   PRESERVISION AREDS PO Take 1 tablet by mouth every morning.   PreviDent 5000 Booster Plus  1.1 % Pste Generic drug: Sodium Fluoride Place onto teeth.   prochlorperazine 5 MG tablet Commonly known as: COMPAZINE Take 5 mg by mouth every 8 (eight) hours as needed.   promethazine 25 MG tablet Commonly known as: PHENERGAN Take 1 tablet (25 mg total) by mouth every 6 (six) hours as needed for nausea or vomiting. What changed: how much to take   senna 8.6 MG tablet Commonly known as: SENOKOT Take 1 tablet by mouth daily as needed for constipation.   TECLISTAMAB-CQYV [place] Inject into the skin.   traZODone 50 MG tablet Commonly known as: DESYREL Take 50 mg by mouth at bedtime.   valACYclovir 500 MG tablet Commonly known as: VALTREX Take 500 mg by mouth daily.   vitamin B-12 500 MCG tablet Commonly known as: CYANOCOBALAMIN Take 500 mcg by mouth daily.        Consultations: Pharm ID  Procedures/Studies:   US RENAL  Result Date: 02/22/2023 CLINICAL DATA:  One-week history of urinary tract infection for evaluation of pyelonephritis EXAM: RENAL / URINARY TRACT ULTRASOUND COMPLETE COMPARISON:  CT abdomen and pelvis dated 10/20/2022 FINDINGS: Right Kidney: Length = 9.3 cm AP renal pelvis diameter = <10 mm Normal parenchymal echogenicity with preserved corticomedullary differentiation. Right upper pole simple cyst measures 1.4 x 1.2 x 0.9 cm. No specific follow-up imaging recommended. No  urinary tract dilation or shadowing calculi. The ureter is not seen. Left Kidney: Length = 8.1 cm AP renal pelvis diameter = <10 mm Multifocal renal cortical scarring. Normal parenchymal echogenicity with preserved corticomedullary differentiation. No urinary tract dilation. Cortical calcification versus stone measures 6 mm in the interpolar region. The ureter is not seen. Bladder: Appears normal for degree of bladder distention. Other: None. IMPRESSION: 1. Normal renal parenchymal echogenicity without new focal abnormalities to suggest pyelonephritis. No urinary tract dilation. 2. Multifocal renal cortical scarring of the left kidney. 6 mm left interpolar cortical calcification versus stone. Electronically Signed   By: Darrin Nipper M.D.   On: 02/22/2023 17:19   DG Chest 2 View  Result Date: 02/22/2023 CLINICAL DATA:  Suspected sepsis, UTI, worsening symptoms, dehydration, hypertension, multiple myeloma EXAM: CHEST - 2 VIEW COMPARISON:  03/23/2021 FINDINGS: Normal heart size, mediastinal contours, and pulmonary vascularity. Atherosclerotic calcification aorta. Lungs hyperinflated but clear. No acute infiltrate, pleural effusion, or pneumothorax. Diffuse osseous demineralization. IMPRESSION: No acute abnormalities. Aortic Atherosclerosis (ICD10-I70.0). Electronically Signed   By: Lavonia Dana M.D.   On: 02/22/2023 14:14       The results of significant diagnostics from this hospitalization (including imaging, microbiology, ancillary and laboratory) are listed below for reference.     Microbiology: Recent Results (from the past 240 hour(s))  CULTURE, URINE COMPREHENSIVE     Status: Abnormal   Collection Time: 02/16/23  3:58 PM   Specimen: Urine   Urine  Result Value Ref Range Status   Urine Culture, Comprehensive Final report (A)  Final   Organism ID, Bacteria Comment (A)  Final    Comment: Pseudomonas aeruginosa 10,000-25,000 colony forming units per mL    Organism ID, Bacteria Not applicable   Final   ANTIMICROBIAL SUSCEPTIBILITY Comment  Final    Comment:       ** S = Susceptible; I = Intermediate; R = Resistant **                    P = Positive; N = Negative             MICS are expressed in  micrograms per mL    Antibiotic                 RSLT#1    RSLT#2    RSLT#3    RSLT#4 Amikacin                       S Cefepime                       S Ceftazidime                    S Ciprofloxacin                  R Gentamicin                     S Imipenem                       S Levofloxacin                   R Meropenem                      S Piperacillin                   S Ticarcillin                    S Tobramycin                     S   Culture, blood (Routine x 2)     Status: None (Preliminary result)   Collection Time: 02/22/23  1:28 PM   Specimen: BLOOD RIGHT HAND  Result Value Ref Range Status   Specimen Description BLOOD RIGHT HAND  Final   Special Requests   Final    BOTTLES DRAWN AEROBIC AND ANAEROBIC Blood Culture adequate volume   Culture   Final    NO GROWTH 3 DAYS Performed at [facility], [address], [place], [place] [address]    Report Status PENDING  Incomplete  Culture, blood (Routine x 2)     Status: None (Preliminary result)   Collection Time: 02/22/23  1:36 PM   Specimen: BLOOD LEFT HAND  Result Value Ref Range Status   Specimen Description BLOOD LEFT HAND  Final   Special Requests   Final    BOTTLES DRAWN AEROBIC AND ANAEROBIC Blood Culture adequate volume   Culture   Final    NO GROWTH 3 DAYS Performed at [facility], [address], [place], [place] [address]    Report Status PENDING  Incomplete  Urine Culture (for pregnant, neutropenic or urologic patients or patients with an indwelling urinary catheter)     Status: Abnormal   Collection Time: 02/22/23  2:39 PM   Specimen: Urine, Clean Catch  Result Value Ref Range Status   Specimen Description   Final    URINE, CLEAN CATCH Performed at [facility],  [address] [place], [place] [address]    Special Requests   Final    NONE Performed at [facility], [address] [place], [place] [address]    Culture 20,000 COLONIES/mL PSEUDOMONAS AERUGINOSA (A)  Final   Report Status 02/25/2023 FINAL  Final   Organism ID, Bacteria PSEUDOMONAS AERUGINOSA (A)  Final      Susceptibility   Pseudomonas aeruginosa - MIC*  CEFTAZIDIME 4 SENSITIVE Sensitive     CIPROFLOXACIN >=4 RESISTANT Resistant     GENTAMICIN 4 SENSITIVE Sensitive     IMIPENEM 2 SENSITIVE Sensitive     PIP/TAZO 8 SENSITIVE Sensitive     * 20,000 COLONIES/mL PSEUDOMONAS AERUGINOSA     Labs:  CBC: Recent Labs  Lab 02/22/23 1328 02/23/23 0356 02/24/23 0424  WBC 7.2 3.9* 4.2  NEUTROABS 4.0  --   --   HGB 11.7* 9.4* 9.7*  HCT 36.9 30.0* 30.8*  MCV 95.3 97.1 96.3  PLT 328 238 235   BMP &GFR Recent Labs  Lab 02/22/23 1328 02/23/23 0356 02/24/23 0424  NA 137 140 137  K 3.5 3.4* 3.6  CL 107 110 105  CO2 21* 24 28  GLUCOSE 128* 95 103*  BUN '11 9 14  '$ CREATININE 0.77 0.71 0.82  CALCIUM 8.5* 7.6* 8.5*  MG  --   --  2.1  PHOS  --   --  4.1   Estimated Creatinine Clearance: 48.3 mL/min (by C-G formula based on SCr of 0.82 mg/dL). Liver & Pancreas: Recent Labs  Lab 02/22/23 1328 02/24/23 0424  AST 29  --   ALT 18  --   ALKPHOS 243*  --   BILITOT 0.8  --   PROT 6.6  --   ALBUMIN 3.5 2.9*   No results for input(s): "LIPASE", "AMYLASE" in the last 168 hours. No results for input(s): "AMMONIA" in the last 168 hours. Diabetic: No results for input(s): "HGBA1C" in the last 72 hours. No results for input(s): "GLUCAP" in the last 168 hours. Cardiac Enzymes: No results for input(s): "CKTOTAL", "CKMB", "CKMBINDEX", "TROPONINI" in the last 168 hours. No results for input(s): "PROBNP" in the last 8760 hours. Coagulation Profile: Recent Labs  Lab 02/22/23 1328  INR 1.0   Thyroid Function Tests: No results for input(s): "TSH", "T4TOTAL", "FREET4",  "T3FREE", "THYROIDAB" in the last 72 hours. Lipid Profile: No results for input(s): "CHOL", "HDL", "LDLCALC", "TRIG", "CHOLHDL", "LDLDIRECT" in the last 72 hours. Anemia Panel: Recent Labs    02/24/23 0424  VITAMINB12 653  FOLATE 7.3  FERRITIN 28  TIBC 350  IRON 73  RETICCTPCT 4.3*   Urine analysis:    Component Value Date/Time   COLORURINE YELLOW (A) 02/22/2023 1439   APPEARANCEUR CLEAR (A) 02/22/2023 1439   APPEARANCEUR Clear 10/16/2022 1115   LABSPEC 1.009 02/22/2023 1439   LABSPEC 1.025 01/06/2014 1152   PHURINE 5.0 02/22/2023 1439   GLUCOSEU NEGATIVE 02/22/2023 1439   GLUCOSEU Negative 01/06/2014 1152   HGBUR NEGATIVE 02/22/2023 1439   BILIRUBINUR NEGATIVE 02/22/2023 1439   BILIRUBINUR Negative 02/16/2023 1106   BILIRUBINUR Negative 10/16/2022 1115   BILIRUBINUR Negative 01/06/2014 1152   KETONESUR NEGATIVE 02/22/2023 1439   PROTEINUR NEGATIVE 02/22/2023 1439   UROBILINOGEN 0.2 02/16/2023 1106   NITRITE NEGATIVE 02/22/2023 1439   LEUKOCYTESUR LARGE (A) 02/22/2023 1439   LEUKOCYTESUR Negative 01/06/2014 1152   Sepsis Labs: Invalid input(s): "PROCALCITONIN", "LACTICIDVEN"   SIGNED:  Mercy Riding, MD  [facility] 02/25/2023, 3:13 PM

## 2023-02-25 NOTE — Care Management Important Message (Signed)
Important Message  Patient Details  Name: Sherry Oneal MRN: [id number] Date of Birth: 1946/07/23   Medicare Important Message Given:  Yes     Dannette Barbara 02/25/2023, 11:50 AM

## 2023-02-25 NOTE — Progress Notes (Signed)
Pauline Aus to be D/C'd Home per MD order.  Discussed prescriptions and follow up appointments with the patient. Prescriptions given to patient, medication list explained in detail. Pt verbalized understanding.  Allergies as of 02/25/2023       Reactions   Aspirin Other (See Comments)   Causes internal bleeding per patient   Atenolol    "wierd feeling"   Doxycycline    Upset stomach   Epinephrine    "Shaking"   Ivp Dye [iodinated Contrast Media] Hypertension   Per tech conversation with PT, pt states that the last time she had IV dye she had increased BP and tachycardia.      Ketorolac    Nsaids    Bleeding   Penicillins    rash   Sulfa Antibiotics    Rash   Tolmetin    Bleeding   Tramadol    Numb feeling   Procaine Palpitations   States this was added due to palpitations and shakes after receiving local anesthetic containing epinephrine.        Medication List     STOP taking these medications    chlorpheniramine-HYDROcodone 10-8 MG/5ML Commonly known as: TUSSIONEX   ciprofloxacin 500 MG tablet Commonly known as: Cipro       TAKE these medications    acetaminophen 500 MG tablet Commonly known as: TYLENOL Take 650 mg by mouth in the morning and at bedtime.   ALPRAZolam 0.25 MG tablet Commonly known as: XANAX Take 1 tablet (0.25 mg total) by mouth 2 (two) times daily as needed for anxiety or sleep.   atorvastatin 10 MG tablet Commonly known as: LIPITOR Take 1 tablet by mouth once daily   Calcium Carbonate-Vitamin D 600-400 MG-UNIT tablet Take 1 tablet by mouth daily.   chlorhexidine 0.12 % solution Commonly known as: PERIDEX Swish and spit 15 ml once or twice daily to prevent oral/dental infections   Cranberry 500 MG Caps Take 500 mg by mouth every morning.   dapsone 100 MG tablet Take 100 mg by mouth daily.   dexamethasone 4 MG tablet Commonly known as: DECADRON Take 20 mg by mouth daily.   dicyclomine 10 MG capsule Commonly known as:  BENTYL TAKE 1 CAPSULE BY MOUTH THREE TIMES DAILY AS NEEDED  FOR SPASMS  TAKE WITH MEALS   diphenhydrAMINE 25 MG tablet Commonly known as: BENADRYL Take 25 mg by mouth daily as needed.   docusate 50 MG/5ML liquid Commonly known as: COLACE Take by mouth daily as needed for mild constipation.   fluticasone 50 MCG/ACT nasal spray Commonly known as: FLONASE Use 2 spray(s) in each nostril once daily   ipratropium-albuterol 0.5-2.5 (3) MG/3ML Soln Commonly known as: DUONEB Take 3 mLs by nebulization every 6 (six) hours as needed.   meclizine 25 MG tablet Commonly known as: ANTIVERT Take 25 mg by mouth as needed for dizziness.   omeprazole 20 MG capsule Commonly known as: PRILOSEC Take 20 mg by mouth daily.   ondansetron 4 MG disintegrating tablet Commonly known as: ZOFRAN-ODT DISSOLVE 1 TABLET IN MOUTH EVERY 8 HOURS AS NEEDED FOR NAUSEA   oxyCODONE 5 MG immediate release tablet Commonly known as: Oxy IR/ROXICODONE Take 5 mg by mouth every 4 (four) hours as needed.   polyethylene glycol 17 g packet Commonly known as: MIRALAX / GLYCOLAX Take 17 g by mouth daily as needed.   PRESERVISION AREDS PO Take 1 tablet by mouth every morning.   PreviDent 5000 Booster Plus 1.1 % Pste Generic drug: Sodium  Fluoride Place onto teeth.   prochlorperazine 5 MG tablet Commonly known as: COMPAZINE Take 5 mg by mouth every 8 (eight) hours as needed.   promethazine 25 MG tablet Commonly known as: PHENERGAN Take 1 tablet (25 mg total) by mouth every 6 (six) hours as needed for nausea or vomiting. What changed: how much to take   senna 8.6 MG tablet Commonly known as: SENOKOT Take 1 tablet by mouth daily as needed for constipation.   TECLISTAMAB-CQYV [place] Inject into the skin.   traZODone 50 MG tablet Commonly known as: DESYREL Take 50 mg by mouth at bedtime.   valACYclovir 500 MG tablet Commonly known as: VALTREX Take 500 mg by mouth daily.   vitamin B-12 500 MCG tablet Commonly  known as: CYANOCOBALAMIN Take 500 mcg by mouth daily.        Vitals:   02/24/23 1927 02/25/23 0331  BP: (!) 131/58 (!) 134/53  Pulse: 79 70  Resp: 18 18  Temp: 97.9 F (36.6 C) 97.8 F (36.6 C)  SpO2: 93% 96%    Skin clean, dry and intact without evidence of skin break down, no evidence of skin tears noted. IV catheter discontinued intact. Site without signs and symptoms of complications. Dressing and pressure applied. Pt denies pain at this time. No complaints noted.  An After Visit Summary was printed and given to the patient. Patient escorted via [place], and D/C home via private auto.  [place] C. Deatra Ina

## 2023-02-27 LAB — CULTURE, BLOOD (ROUTINE X 2)
Culture: NO GROWTH
Culture: NO GROWTH
Special Requests: ADEQUATE
Special Requests: ADEQUATE

## 2023-03-02 ENCOUNTER — Ambulatory Visit (INDEPENDENT_AMBULATORY_CARE_PROVIDER_SITE_OTHER): Payer: PPO | Admitting: Internal Medicine

## 2023-03-02 ENCOUNTER — Encounter: Payer: Self-pay | Admitting: Internal Medicine

## 2023-03-02 VITALS — BP 137/68 | HR 78 | Temp 98.3°F | Resp 16 | Ht 63.0 in | Wt 124.0 lb

## 2023-03-02 DIAGNOSIS — Z09 Encounter for follow-up examination after completed treatment for conditions other than malignant neoplasm: Secondary | ICD-10-CM | POA: Diagnosis not present

## 2023-03-02 DIAGNOSIS — N3000 Acute cystitis without hematuria: Secondary | ICD-10-CM

## 2023-03-02 DIAGNOSIS — R300 Dysuria: Secondary | ICD-10-CM | POA: Diagnosis not present

## 2023-03-02 DIAGNOSIS — C9000 Multiple myeloma not having achieved remission: Secondary | ICD-10-CM

## 2023-03-02 DIAGNOSIS — N390 Urinary tract infection, site not specified: Secondary | ICD-10-CM | POA: Diagnosis not present

## 2023-03-02 DIAGNOSIS — R319 Hematuria, unspecified: Secondary | ICD-10-CM

## 2023-03-02 LAB — POCT URINALYSIS DIPSTICK
Bilirubin, UA: NEGATIVE
Glucose, UA: NEGATIVE
Ketones, UA: NEGATIVE
Nitrite, UA: NEGATIVE
Protein, UA: NEGATIVE
Spec Grav, UA: 1.01 (ref 1.010–1.025)
Urobilinogen, UA: 0.2 E.U./dL
pH, UA: 5 (ref 5.0–8.0)

## 2023-03-02 NOTE — Progress Notes (Signed)
[facility] [place], [place] [address]  Internal MEDICINE  Office Visit Note  Patient Name: Sherry Oneal  [id number]  [id number]                                Transitional care management                                     Date of admission 02/22/23                                   Date of Discharge 02/25/2023  Date of visit  03/02/2023  Date of Service: 03/17/2023   Chief Complaint  Patient presents with   Hospitalization Follow-up   Cystitis    Patient is still experiencing burning upon urination, she states recent antibiotics did not help. Was given IV fluids in the hospital, but still experiencing burning. Her urine dip is positive for blood and leukocytes.     HPI Pt is here for recent hospital follow up. Hospital course 77 year old F with PMH of MM on Teclistamab and IVIG, HTN, HLD and macular degeneration directed to ED by PCP due to Pseudomonas UTI resistant to quinolone.  Patient had dysuria and lower abdominal pain about 6 days ago and saw PCP.  At that time, urine culture obtained and she was started on Cipro.  However, urine culture with Pseudomonas resistant to quinolones.  So she was advised to go to ED for IV antibiotics.  Patient reports improvement in dysuria.  No constitutional symptoms, hematuria or signs of pyelonephritis.   In ED, slightly tachycardic with slightly elevated BP.  WBC 7.2.  Lactic acid normal.  UA with large LE today.  Blood and urine cultures obtained.  Started on IV cefepime based on culture sensitivity from PCP office.  Urine culture with Pseudomonas aeruginosa resistant to ciprofloxacin.   Patient received IV cefepime for 3 days.  UTI symptoms/dysuria resolved.  After discussion with Pharm ID, she received IV tobramycin x 1 to complete antibiotic course, and discharged home.  Pt will need to see Urology, still feels tired    Current Medication: Outpatient Encounter Medications as of 03/02/2023  Medication Sig Note    acetaminophen (TYLENOL) 500 MG tablet Take 650 mg by mouth in the morning and at bedtime.    ALPRAZolam (XANAX) 0.25 MG tablet Take 1 tablet (0.25 mg total) by mouth 2 (two) times daily as needed for anxiety or sleep.    Calcium Carbonate-Vitamin D 600-400 MG-UNIT tablet Take 1 tablet by mouth daily.    chlorhexidine (PERIDEX) 0.12 % solution Swish and spit 15 ml once or twice daily to prevent oral/dental infections    Cranberry 500 MG CAPS Take 500 mg by mouth every morning.    dapsone 100 MG tablet Take 100 mg by mouth daily.    dexamethasone (DECADRON) 4 MG tablet Take 20 mg by mouth daily. 12/14/2022: For chemo   dicyclomine (BENTYL) 10 MG capsule TAKE 1 CAPSULE BY MOUTH THREE TIMES DAILY AS NEEDED  FOR SPASMS  TAKE WITH MEALS    diphenhydrAMINE (BENADRYL) 25 MG tablet Take 25 mg by mouth daily as needed.    docusate (COLACE) 50 MG/5ML liquid Take by mouth daily as needed for mild constipation.  fluticasone (FLONASE) 50 MCG/ACT nasal spray Use 2 spray(s) in each nostril once daily    ipratropium-albuterol (DUONEB) 0.5-2.5 (3) MG/3ML SOLN Take 3 mLs by nebulization every 6 (six) hours as needed.    meclizine (ANTIVERT) 25 MG tablet Take 25 mg by mouth as needed for dizziness.    Multiple Vitamins-Minerals (PRESERVISION AREDS PO) Take 1 tablet by mouth every morning.    omeprazole (PRILOSEC) 20 MG capsule Take 20 mg by mouth daily.    ondansetron (ZOFRAN-ODT) 4 MG disintegrating tablet DISSOLVE 1 TABLET IN MOUTH EVERY 8 HOURS AS NEEDED FOR NAUSEA    oxyCODONE (OXY IR/ROXICODONE) 5 MG immediate release tablet Take 5 mg by mouth every 4 (four) hours as needed.    polyethylene glycol (MIRALAX / GLYCOLAX) packet Take 17 g by mouth daily as needed.    prochlorperazine (COMPAZINE) 5 MG tablet Take 5 mg by mouth every 8 (eight) hours as needed.    promethazine (PHENERGAN) 25 MG tablet Take 1 tablet (25 mg total) by mouth every 6 (six) hours as needed for nausea or vomiting. (Patient taking  differently: Take 12.5 mg by mouth every 6 (six) hours as needed for nausea or vomiting.)    senna (SENOKOT) 8.6 MG tablet Take 1 tablet by mouth daily as needed for constipation.    Sodium Fluoride (PREVIDENT 5000 BOOSTER PLUS) 1.1 % PSTE Place onto teeth.    TECLISTAMAB-CQYV [place] Inject into the skin.    traZODone (DESYREL) 50 MG tablet Take 50 mg by mouth at bedtime.    valACYclovir (VALTREX) 500 MG tablet Take 500 mg by mouth daily.    vitamin B-12 (CYANOCOBALAMIN) 500 MCG tablet Take 500 mcg by mouth daily.    [DISCONTINUED] atorvastatin (LIPITOR) 10 MG tablet Take 1 tablet by mouth once daily    No facility-administered encounter medications on file as of 03/02/2023.    Surgical History: Past Surgical History:  Procedure Laterality Date   CATARACT EXTRACTION W/PHACO Right 08/17/2018   Procedure: CATARACT EXTRACTION PHACO AND INTRAOCULAR LENS PLACEMENT ([place])  RIGHT;  Surgeon: Leandrew Koyanagi, MD;  Location: [facility];  Service: Ophthalmology;  Laterality: Right;   CATARACT EXTRACTION W/PHACO Left 11/09/2018   Procedure: CATARACT EXTRACTION PHACO AND INTRAOCULAR LENS PLACEMENT ([place]) LEFT;  Surgeon: Leandrew Koyanagi, MD;  Location: [facility];  Service: Ophthalmology;  Laterality: Left;   EXPLORATORY LAPAROTOMY      Medical History: Past Medical History:  Diagnosis Date   Anemia    Arthritis    lower back   High blood pressure    High cholesterol    History of actinic keratosis    Macular degeneration    recieving 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    Multiple myeloma (HCC)    Renal insufficiency    Wears dentures    partial upper    Family History: Family History  Problem Relation Age of Onset   Breast cancer Maternal Grandmother 41   CAD Mother    CVA Mother     Social History   Socioeconomic History   Marital status: Married    Spouse name: Not on file   Number of children: 1   Years of education: Not on  file   Highest education level: Not on file  Occupational History   Not on file  Tobacco Use   Smoking status: Never   Smokeless tobacco: Never  Vaping Use   Vaping Use: Never used  Substance and Sexual Activity   Alcohol  use: No   Drug use: No   Sexual activity: Not Currently    Birth control/protection: None  Other Topics Concern   Not on file  Social History Narrative   Lives at home with her husband, independent at baseline   Social Determinants of Health   Financial Resource Strain: Low Risk  (04/16/2021)   Overall Financial Resource Strain (CARDIA)    Difficulty of Paying Living Expenses: Not very hard  Food Insecurity: No Food Insecurity (02/22/2023)   Hunger Vital Sign    Worried About Running Out of Food in the Last Year: Never true    Ran Out of Food in the Last Year: Never true  Transportation Needs: No Transportation Needs (02/22/2023)   PRAPARE - Hydrologist (Medical): No    Lack of Transportation (Non-Medical): No  Physical Activity: Inactive (03/22/2018)   Exercise Vital Sign    Days of Exercise per Week: 0 days    Minutes of Exercise per Session: 0 min  Stress: No Stress Concern Present (03/22/2018)   [place]    Feeling of Stress : Only a little  Social Connections: Moderately Integrated (03/22/2018)   Social Connection and Isolation Panel [NHANES]    Frequency of Communication with Friends and Family: More than three times a week    Frequency of Social Gatherings with Friends and Family: Three times a week    Attends Religious Services: 1 to 4 times per year    Active Member of Clubs or Organizations: No    Attends Archivist Meetings: Never    Marital Status: Married  Human resources officer Violence: Not At Risk (02/22/2023)   Humiliation, Afraid, Rape, and Kick questionnaire    Fear of Current or Ex-Partner: No    Emotionally Abused: No    Physically  Abused: No    Sexually Abused: No      Review of Systems  Constitutional:  Negative for chills, fatigue and unexpected weight change.  HENT:  Negative for congestion, postnasal drip, rhinorrhea, sneezing and sore throat.   Eyes:  Negative for redness.  Respiratory:  Negative for cough, chest tightness and shortness of breath.   Cardiovascular:  Negative for chest pain and palpitations.  Gastrointestinal:  Negative for abdominal pain, constipation, diarrhea, nausea and vomiting.  Genitourinary:  Negative for dysuria and frequency.  Musculoskeletal:  Negative for arthralgias, back pain, joint swelling and neck pain.  Skin:  Negative for rash.  Neurological: Negative.  Negative for tremors and numbness.  Hematological:  Negative for adenopathy. Does not bruise/bleed easily.  Psychiatric/Behavioral:  Negative for behavioral problems (Depression), sleep disturbance and suicidal ideas. The patient is not nervous/anxious.     Vital Signs: BP 137/68   Pulse 78   Temp 98.3 F (36.8 C)   Resp 16   Ht 5\' 3"  (1.6 m)   Wt 124 lb (56.2 kg)   SpO2 93%   BMI 21.97 kg/m    Physical Exam Constitutional:      Appearance: Normal appearance.  HENT:     Head: Normocephalic and atraumatic.     Nose: Nose normal.     Mouth/Throat:     Mouth: Mucous membranes are moist.     Pharynx: No posterior oropharyngeal erythema.  Eyes:     Extraocular Movements: Extraocular movements intact.     Pupils: Pupils are equal, round, and reactive to light.  Cardiovascular:     Pulses: Normal pulses.  Heart sounds: Normal heart sounds.  Pulmonary:     Effort: Pulmonary effort is normal.     Breath sounds: Normal breath sounds.  Neurological:     General: No focal deficit present.     Mental Status: She is alert.  Psychiatric:        Mood and Affect: Mood normal.        Behavior: Behavior normal.       Assessment/Plan: 1. Acute cystitis without hematuria Will need to see urology, Continue  abx as per hospital discharge  - POCT Urinalysis Dipstick - CULTURE, URINE COMPREHENSIVE  2. Multiple myeloma not having achieved remission ([place]) Followed by Hem/Oncology   3. Hospital discharge follow-up All medications and notes reviewed    General Counseling: Sandy Salaam understanding of the findings of todays visit and agrees with plan of treatment. I have discussed any further diagnostic evaluation that may be needed or ordered today. We also reviewed her medications today. she has been encouraged to call the office with any questions or concerns that should arise related to todays visit.    Counseling:  [place] Controlled Substance Database was reviewed by me.  Orders Placed This Encounter  Procedures   CULTURE, URINE COMPREHENSIVE   POCT Urinalysis Dipstick      I have reviewed all medical records from hospital follow up including radiology reports and consults from other physicians. Appropriate follow up diagnostics will be scheduled as needed. Patient/ Family understands the plan of treatment. Time spent 45 minutes.   Dr Lavera Guise, MD Internal Medicine

## 2023-03-04 ENCOUNTER — Inpatient Hospital Stay: Payer: PPO | Admitting: Nurse Practitioner

## 2023-03-05 DIAGNOSIS — C9002 Multiple myeloma in relapse: Secondary | ICD-10-CM | POA: Diagnosis not present

## 2023-03-05 LAB — CULTURE, URINE COMPREHENSIVE

## 2023-03-09 DIAGNOSIS — N390 Urinary tract infection, site not specified: Secondary | ICD-10-CM | POA: Diagnosis not present

## 2023-03-09 DIAGNOSIS — D61810 Antineoplastic chemotherapy induced pancytopenia: Secondary | ICD-10-CM | POA: Diagnosis not present

## 2023-03-09 DIAGNOSIS — D801 Nonfamilial hypogammaglobulinemia: Secondary | ICD-10-CM | POA: Diagnosis not present

## 2023-03-09 DIAGNOSIS — C9002 Multiple myeloma in relapse: Secondary | ICD-10-CM | POA: Diagnosis not present

## 2023-03-09 DIAGNOSIS — R5381 Other malaise: Secondary | ICD-10-CM | POA: Diagnosis not present

## 2023-03-09 DIAGNOSIS — Z79899 Other long term (current) drug therapy: Secondary | ICD-10-CM | POA: Diagnosis not present

## 2023-03-09 DIAGNOSIS — Z880 Allergy status to penicillin: Secondary | ICD-10-CM | POA: Diagnosis not present

## 2023-03-09 DIAGNOSIS — H3530 Unspecified macular degeneration: Secondary | ICD-10-CM | POA: Diagnosis not present

## 2023-03-09 DIAGNOSIS — R0789 Other chest pain: Secondary | ICD-10-CM | POA: Diagnosis not present

## 2023-03-09 DIAGNOSIS — R0609 Other forms of dyspnea: Secondary | ICD-10-CM | POA: Diagnosis not present

## 2023-03-09 DIAGNOSIS — Z923 Personal history of irradiation: Secondary | ICD-10-CM | POA: Diagnosis not present

## 2023-03-09 DIAGNOSIS — T451X5A Adverse effect of antineoplastic and immunosuppressive drugs, initial encounter: Secondary | ICD-10-CM | POA: Diagnosis not present

## 2023-03-09 DIAGNOSIS — E041 Nontoxic single thyroid nodule: Secondary | ICD-10-CM | POA: Diagnosis not present

## 2023-03-09 DIAGNOSIS — D701 Agranulocytosis secondary to cancer chemotherapy: Secondary | ICD-10-CM | POA: Diagnosis not present

## 2023-03-09 DIAGNOSIS — D61818 Other pancytopenia: Secondary | ICD-10-CM | POA: Diagnosis not present

## 2023-03-09 DIAGNOSIS — Z882 Allergy status to sulfonamides status: Secondary | ICD-10-CM | POA: Diagnosis not present

## 2023-03-09 DIAGNOSIS — F419 Anxiety disorder, unspecified: Secondary | ICD-10-CM | POA: Diagnosis not present

## 2023-03-09 DIAGNOSIS — Z886 Allergy status to analgesic agent status: Secondary | ICD-10-CM | POA: Diagnosis not present

## 2023-03-09 DIAGNOSIS — Z5112 Encounter for antineoplastic immunotherapy: Secondary | ICD-10-CM | POA: Diagnosis not present

## 2023-03-09 DIAGNOSIS — F32A Depression, unspecified: Secondary | ICD-10-CM | POA: Diagnosis not present

## 2023-03-09 DIAGNOSIS — Z885 Allergy status to narcotic agent status: Secondary | ICD-10-CM | POA: Diagnosis not present

## 2023-03-09 DIAGNOSIS — Z881 Allergy status to other antibiotic agents status: Secondary | ICD-10-CM | POA: Diagnosis not present

## 2023-03-09 DIAGNOSIS — M8588 Other specified disorders of bone density and structure, other site: Secondary | ICD-10-CM | POA: Diagnosis not present

## 2023-03-12 ENCOUNTER — Telehealth: Payer: Self-pay

## 2023-03-12 NOTE — Telephone Encounter (Signed)
Pt called about her UA culture result advised as per alyssa advised urine culture abnormal but as per pt she done again at [facility] and they call her is normal and cleared as per alyssa advised her if clear no med need if develop an symptoms call us back

## 2023-03-15 ENCOUNTER — Other Ambulatory Visit: Payer: Self-pay | Admitting: Nurse Practitioner

## 2023-03-15 DIAGNOSIS — E782 Mixed hyperlipidemia: Secondary | ICD-10-CM

## 2023-03-16 ENCOUNTER — Ambulatory Visit: Payer: PPO | Admitting: Nurse Practitioner

## 2023-03-17 DIAGNOSIS — R0789 Other chest pain: Secondary | ICD-10-CM | POA: Diagnosis not present

## 2023-03-17 DIAGNOSIS — R0609 Other forms of dyspnea: Secondary | ICD-10-CM | POA: Diagnosis not present

## 2023-03-17 DIAGNOSIS — C9002 Multiple myeloma in relapse: Secondary | ICD-10-CM | POA: Diagnosis not present

## 2023-03-19 DIAGNOSIS — H40003 Preglaucoma, unspecified, bilateral: Secondary | ICD-10-CM | POA: Diagnosis not present

## 2023-03-21 ENCOUNTER — Other Ambulatory Visit: Payer: Self-pay | Admitting: Nurse Practitioner

## 2023-03-24 DIAGNOSIS — Z5112 Encounter for antineoplastic immunotherapy: Secondary | ICD-10-CM | POA: Diagnosis not present

## 2023-03-24 DIAGNOSIS — R0789 Other chest pain: Secondary | ICD-10-CM | POA: Diagnosis not present

## 2023-03-24 DIAGNOSIS — C9002 Multiple myeloma in relapse: Secondary | ICD-10-CM | POA: Diagnosis not present

## 2023-03-24 DIAGNOSIS — R0609 Other forms of dyspnea: Secondary | ICD-10-CM | POA: Diagnosis not present

## 2023-03-29 DIAGNOSIS — H353221 Exudative age-related macular degeneration, left eye, with active choroidal neovascularization: Secondary | ICD-10-CM | POA: Diagnosis not present

## 2023-03-31 DIAGNOSIS — R0609 Other forms of dyspnea: Secondary | ICD-10-CM | POA: Diagnosis not present

## 2023-03-31 DIAGNOSIS — Z881 Allergy status to other antibiotic agents status: Secondary | ICD-10-CM | POA: Diagnosis not present

## 2023-03-31 DIAGNOSIS — Z886 Allergy status to analgesic agent status: Secondary | ICD-10-CM | POA: Diagnosis not present

## 2023-03-31 DIAGNOSIS — D61810 Antineoplastic chemotherapy induced pancytopenia: Secondary | ICD-10-CM | POA: Diagnosis not present

## 2023-03-31 DIAGNOSIS — C9002 Multiple myeloma in relapse: Secondary | ICD-10-CM | POA: Diagnosis not present

## 2023-03-31 DIAGNOSIS — Z79899 Other long term (current) drug therapy: Secondary | ICD-10-CM | POA: Diagnosis not present

## 2023-03-31 DIAGNOSIS — M79605 Pain in left leg: Secondary | ICD-10-CM | POA: Diagnosis not present

## 2023-03-31 DIAGNOSIS — R6884 Jaw pain: Secondary | ICD-10-CM | POA: Diagnosis not present

## 2023-03-31 DIAGNOSIS — N3090 Cystitis, unspecified without hematuria: Secondary | ICD-10-CM | POA: Diagnosis not present

## 2023-03-31 DIAGNOSIS — D701 Agranulocytosis secondary to cancer chemotherapy: Secondary | ICD-10-CM | POA: Diagnosis not present

## 2023-03-31 DIAGNOSIS — H3530 Unspecified macular degeneration: Secondary | ICD-10-CM | POA: Diagnosis not present

## 2023-03-31 DIAGNOSIS — Z880 Allergy status to penicillin: Secondary | ICD-10-CM | POA: Diagnosis not present

## 2023-03-31 DIAGNOSIS — N281 Cyst of kidney, acquired: Secondary | ICD-10-CM | POA: Diagnosis not present

## 2023-03-31 DIAGNOSIS — M8588 Other specified disorders of bone density and structure, other site: Secondary | ICD-10-CM | POA: Diagnosis not present

## 2023-03-31 DIAGNOSIS — T451X5A Adverse effect of antineoplastic and immunosuppressive drugs, initial encounter: Secondary | ICD-10-CM | POA: Diagnosis not present

## 2023-03-31 DIAGNOSIS — D801 Nonfamilial hypogammaglobulinemia: Secondary | ICD-10-CM | POA: Diagnosis not present

## 2023-03-31 DIAGNOSIS — R5381 Other malaise: Secondary | ICD-10-CM | POA: Diagnosis not present

## 2023-03-31 DIAGNOSIS — D61818 Other pancytopenia: Secondary | ICD-10-CM | POA: Diagnosis not present

## 2023-03-31 DIAGNOSIS — Z23 Encounter for immunization: Secondary | ICD-10-CM | POA: Diagnosis not present

## 2023-03-31 DIAGNOSIS — R0789 Other chest pain: Secondary | ICD-10-CM | POA: Diagnosis not present

## 2023-03-31 DIAGNOSIS — F419 Anxiety disorder, unspecified: Secondary | ICD-10-CM | POA: Diagnosis not present

## 2023-03-31 DIAGNOSIS — M79604 Pain in right leg: Secondary | ICD-10-CM | POA: Diagnosis not present

## 2023-03-31 DIAGNOSIS — R0781 Pleurodynia: Secondary | ICD-10-CM | POA: Diagnosis not present

## 2023-03-31 DIAGNOSIS — Z5112 Encounter for antineoplastic immunotherapy: Secondary | ICD-10-CM | POA: Diagnosis not present

## 2023-03-31 DIAGNOSIS — E041 Nontoxic single thyroid nodule: Secondary | ICD-10-CM | POA: Diagnosis not present

## 2023-03-31 DIAGNOSIS — N261 Atrophy of kidney (terminal): Secondary | ICD-10-CM | POA: Diagnosis not present

## 2023-03-31 DIAGNOSIS — Z923 Personal history of irradiation: Secondary | ICD-10-CM | POA: Diagnosis not present

## 2023-03-31 DIAGNOSIS — D84821 Immunodeficiency due to drugs: Secondary | ICD-10-CM | POA: Diagnosis not present

## 2023-04-06 ENCOUNTER — Other Ambulatory Visit: Payer: Self-pay

## 2023-04-06 DIAGNOSIS — Z1231 Encounter for screening mammogram for malignant neoplasm of breast: Secondary | ICD-10-CM

## 2023-04-07 DIAGNOSIS — Z882 Allergy status to sulfonamides status: Secondary | ICD-10-CM | POA: Diagnosis not present

## 2023-04-07 DIAGNOSIS — I10 Essential (primary) hypertension: Secondary | ICD-10-CM | POA: Diagnosis not present

## 2023-04-07 DIAGNOSIS — C9002 Multiple myeloma in relapse: Secondary | ICD-10-CM | POA: Diagnosis not present

## 2023-04-07 DIAGNOSIS — R0789 Other chest pain: Secondary | ICD-10-CM | POA: Diagnosis not present

## 2023-04-07 DIAGNOSIS — E785 Hyperlipidemia, unspecified: Secondary | ICD-10-CM | POA: Diagnosis not present

## 2023-04-07 DIAGNOSIS — Z881 Allergy status to other antibiotic agents status: Secondary | ICD-10-CM | POA: Diagnosis not present

## 2023-04-07 DIAGNOSIS — Z885 Allergy status to narcotic agent status: Secondary | ICD-10-CM | POA: Diagnosis not present

## 2023-04-07 DIAGNOSIS — R0609 Other forms of dyspnea: Secondary | ICD-10-CM | POA: Diagnosis not present

## 2023-04-07 DIAGNOSIS — Z452 Encounter for adjustment and management of vascular access device: Secondary | ICD-10-CM | POA: Diagnosis not present

## 2023-04-14 DIAGNOSIS — Z5112 Encounter for antineoplastic immunotherapy: Secondary | ICD-10-CM | POA: Diagnosis not present

## 2023-04-14 DIAGNOSIS — R0789 Other chest pain: Secondary | ICD-10-CM | POA: Diagnosis not present

## 2023-04-14 DIAGNOSIS — R0609 Other forms of dyspnea: Secondary | ICD-10-CM | POA: Diagnosis not present

## 2023-04-14 DIAGNOSIS — C9002 Multiple myeloma in relapse: Secondary | ICD-10-CM | POA: Diagnosis not present

## 2023-04-21 DIAGNOSIS — C9002 Multiple myeloma in relapse: Secondary | ICD-10-CM | POA: Diagnosis not present

## 2023-04-21 DIAGNOSIS — Z5112 Encounter for antineoplastic immunotherapy: Secondary | ICD-10-CM | POA: Diagnosis not present

## 2023-04-21 DIAGNOSIS — R0609 Other forms of dyspnea: Secondary | ICD-10-CM | POA: Diagnosis not present

## 2023-04-21 DIAGNOSIS — R0789 Other chest pain: Secondary | ICD-10-CM | POA: Diagnosis not present

## 2023-04-28 DIAGNOSIS — I10 Essential (primary) hypertension: Secondary | ICD-10-CM | POA: Diagnosis not present

## 2023-04-28 DIAGNOSIS — C9002 Multiple myeloma in relapse: Secondary | ICD-10-CM | POA: Diagnosis not present

## 2023-04-28 DIAGNOSIS — D801 Nonfamilial hypogammaglobulinemia: Secondary | ICD-10-CM | POA: Diagnosis not present

## 2023-04-28 DIAGNOSIS — R0781 Pleurodynia: Secondary | ICD-10-CM | POA: Diagnosis not present

## 2023-04-28 DIAGNOSIS — D61818 Other pancytopenia: Secondary | ICD-10-CM | POA: Diagnosis not present

## 2023-04-28 DIAGNOSIS — R630 Anorexia: Secondary | ICD-10-CM | POA: Diagnosis not present

## 2023-04-28 DIAGNOSIS — R0789 Other chest pain: Secondary | ICD-10-CM | POA: Diagnosis not present

## 2023-04-28 DIAGNOSIS — M79605 Pain in left leg: Secondary | ICD-10-CM | POA: Diagnosis not present

## 2023-04-28 DIAGNOSIS — R6884 Jaw pain: Secondary | ICD-10-CM | POA: Diagnosis not present

## 2023-04-28 DIAGNOSIS — R5381 Other malaise: Secondary | ICD-10-CM | POA: Diagnosis not present

## 2023-04-28 DIAGNOSIS — F419 Anxiety disorder, unspecified: Secondary | ICD-10-CM | POA: Diagnosis not present

## 2023-04-28 DIAGNOSIS — Z87440 Personal history of urinary (tract) infections: Secondary | ICD-10-CM | POA: Diagnosis not present

## 2023-04-28 DIAGNOSIS — M79604 Pain in right leg: Secondary | ICD-10-CM | POA: Diagnosis not present

## 2023-04-28 DIAGNOSIS — T451X5A Adverse effect of antineoplastic and immunosuppressive drugs, initial encounter: Secondary | ICD-10-CM | POA: Diagnosis not present

## 2023-04-28 DIAGNOSIS — R0609 Other forms of dyspnea: Secondary | ICD-10-CM | POA: Diagnosis not present

## 2023-04-28 DIAGNOSIS — D61810 Antineoplastic chemotherapy induced pancytopenia: Secondary | ICD-10-CM | POA: Diagnosis not present

## 2023-04-28 DIAGNOSIS — E041 Nontoxic single thyroid nodule: Secondary | ICD-10-CM | POA: Diagnosis not present

## 2023-04-28 DIAGNOSIS — H3530 Unspecified macular degeneration: Secondary | ICD-10-CM | POA: Diagnosis not present

## 2023-04-28 DIAGNOSIS — Z923 Personal history of irradiation: Secondary | ICD-10-CM | POA: Diagnosis not present

## 2023-05-10 DIAGNOSIS — H353221 Exudative age-related macular degeneration, left eye, with active choroidal neovascularization: Secondary | ICD-10-CM | POA: Diagnosis not present

## 2023-05-13 DIAGNOSIS — I10 Essential (primary) hypertension: Secondary | ICD-10-CM | POA: Diagnosis not present

## 2023-05-13 DIAGNOSIS — Z882 Allergy status to sulfonamides status: Secondary | ICD-10-CM | POA: Diagnosis not present

## 2023-05-13 DIAGNOSIS — Z881 Allergy status to other antibiotic agents status: Secondary | ICD-10-CM | POA: Diagnosis not present

## 2023-05-13 DIAGNOSIS — T7840XD Allergy, unspecified, subsequent encounter: Secondary | ICD-10-CM | POA: Diagnosis not present

## 2023-05-13 DIAGNOSIS — Z885 Allergy status to narcotic agent status: Secondary | ICD-10-CM | POA: Diagnosis not present

## 2023-05-13 DIAGNOSIS — C9002 Multiple myeloma in relapse: Secondary | ICD-10-CM | POA: Diagnosis not present

## 2023-05-13 DIAGNOSIS — R0609 Other forms of dyspnea: Secondary | ICD-10-CM | POA: Diagnosis not present

## 2023-05-13 DIAGNOSIS — R0789 Other chest pain: Secondary | ICD-10-CM | POA: Diagnosis not present

## 2023-05-13 DIAGNOSIS — Z5112 Encounter for antineoplastic immunotherapy: Secondary | ICD-10-CM | POA: Diagnosis not present

## 2023-05-21 ENCOUNTER — Encounter: Payer: Self-pay | Admitting: Nurse Practitioner

## 2023-05-21 ENCOUNTER — Ambulatory Visit (INDEPENDENT_AMBULATORY_CARE_PROVIDER_SITE_OTHER): Payer: PPO | Admitting: Nurse Practitioner

## 2023-05-21 ENCOUNTER — Other Ambulatory Visit: Payer: Self-pay

## 2023-05-21 VITALS — BP 130/72 | HR 86 | Temp 96.9°F | Resp 16 | Ht 63.0 in | Wt 125.2 lb

## 2023-05-21 DIAGNOSIS — F411 Generalized anxiety disorder: Secondary | ICD-10-CM | POA: Diagnosis not present

## 2023-05-21 DIAGNOSIS — C9000 Multiple myeloma not having achieved remission: Secondary | ICD-10-CM | POA: Diagnosis not present

## 2023-05-21 DIAGNOSIS — E782 Mixed hyperlipidemia: Secondary | ICD-10-CM

## 2023-05-21 DIAGNOSIS — M545 Low back pain, unspecified: Secondary | ICD-10-CM | POA: Diagnosis not present

## 2023-05-21 MED ORDER — ATORVASTATIN CALCIUM 10 MG PO TABS: 10.0000 mg | ORAL_TABLET | Freq: Every day | ORAL | 1 refills | Status: DC

## 2023-05-21 MED ORDER — ALPRAZOLAM 0.25 MG PO TABS
0.2500 mg | ORAL_TABLET | Freq: Two times a day (BID) | ORAL | 2 refills | Status: DC | PRN
Start: 2023-05-21 — End: 2023-05-26

## 2023-05-21 MED ORDER — ALPRAZOLAM 0.25 MG PO TABS
0.2500 mg | ORAL_TABLET | Freq: Two times a day (BID) | ORAL | 2 refills | Status: DC | PRN
Start: 2023-05-21 — End: 2023-05-21

## 2023-05-21 NOTE — Progress Notes (Signed)
[facility] [address] [place], [place] [address]  Internal MEDICINE  Office Visit Note  Patient Name: Sherry Oneal  [id number]  [id number]  Date of Service: 05/21/2023  Chief Complaint  Patient presents with   Hyperlipidemia   Hypertension   Follow-up    HPI Sherry Oneal presents for a follow-up visit for multiple myeloma, hyperlipidemia, anxiety.  Back pain -- resolved since being treated with radiation Multiple myeloma -- had 5 rounds of radiation,  Hyperlipidemia -- taking atorvastatin  Anxiety -- takes alprazolam as needed.      Current Medication: Outpatient Encounter Medications as of 05/21/2023  Medication Sig Note   acetaminophen (TYLENOL) 500 MG tablet Take 650 mg by mouth in the morning and at bedtime.    Calcium Carbonate-Vitamin D 600-400 MG-UNIT tablet Take 1 tablet by mouth daily.    Cranberry 500 MG CAPS Take 500 mg by mouth every morning.    dapsone 100 MG tablet Take 100 mg by mouth daily.    dexamethasone (DECADRON) 4 MG tablet Take 20 mg by mouth daily. 12/14/2022: For chemo   dicyclomine (BENTYL) 10 MG capsule TAKE 1 CAPSULE BY MOUTH THREE TIMES DAILY AS NEEDED  FOR SPASMS  TAKE WITH MEALS    diphenhydrAMINE (BENADRYL) 25 MG tablet Take 25 mg by mouth daily as needed.    fluticasone (FLONASE) 50 MCG/ACT nasal spray Use 2 spray(s) in each nostril once daily    Multiple Vitamins-Minerals (PRESERVISION AREDS PO) Take 1 tablet by mouth every morning.    omeprazole (PRILOSEC) 20 MG capsule Take 20 mg by mouth daily.    ondansetron (ZOFRAN-ODT) 4 MG disintegrating tablet DISSOLVE 1 TABLET IN MOUTH EVERY 8 HOURS AS NEEDED FOR NAUSEA    oxyCODONE (OXY IR/ROXICODONE) 5 MG immediate release tablet Take 5 mg by mouth every 4 (four) hours as needed.    polyethylene glycol (MIRALAX / GLYCOLAX) packet Take 17 g by mouth daily as needed.    prochlorperazine (COMPAZINE) 5 MG tablet Take 5 mg by mouth every 8 (eight) hours as needed.    promethazine (PHENERGAN)  25 MG tablet Take 1 tablet (25 mg total) by mouth every 6 (six) hours as needed for nausea or vomiting. (Patient taking differently: Take 12.5 mg by mouth every 6 (six) hours as needed for nausea or vomiting.)    senna (SENOKOT) 8.6 MG tablet Take 1 tablet by mouth daily as needed for constipation.    TECLISTAMAB-CQYV [place] Inject into the skin.    traZODone (DESYREL) 50 MG tablet Take 50 mg by mouth at bedtime.    valACYclovir (VALTREX) 500 MG tablet Take 500 mg by mouth daily.    vitamin B-12 (CYANOCOBALAMIN) 500 MCG tablet Take 500 mcg by mouth daily.    [DISCONTINUED] ALPRAZolam (XANAX) 0.25 MG tablet Take 1 tablet (0.25 mg total) by mouth 2 (two) times daily as needed for anxiety or sleep.    [DISCONTINUED] atorvastatin (LIPITOR) 10 MG tablet Take 1 tablet by mouth once daily    [DISCONTINUED] docusate (COLACE) 50 MG/5ML liquid Take by mouth daily as needed for mild constipation.    [DISCONTINUED] ipratropium-albuterol (DUONEB) 0.5-2.5 (3) MG/3ML SOLN Take 3 mLs by nebulization every 6 (six) hours as needed.    [DISCONTINUED] meclizine (ANTIVERT) 25 MG tablet Take 25 mg by mouth as needed for dizziness.    [DISCONTINUED] Sodium Fluoride (PREVIDENT 5000 BOOSTER PLUS) 1.1 % PSTE Place onto teeth.    ALPRAZolam (XANAX) 0.25 MG tablet Take 1 tablet (0.25 mg total) by mouth  2 (two) times daily as needed for anxiety or sleep.    atorvastatin (LIPITOR) 10 MG tablet Take 1 tablet (10 mg total) by mouth daily.    faricimab-svoa (VABYSMO) 6 MG/0.05ML SOLN intravitreal injection by Intravitreal route.    [DISCONTINUED] ALPRAZolam (XANAX) 0.25 MG tablet Take 1 tablet (0.25 mg total) by mouth 2 (two) times daily as needed for anxiety or sleep.    [DISCONTINUED] atorvastatin (LIPITOR) 10 MG tablet Take 1 tablet (10 mg total) by mouth daily.    [DISCONTINUED] chlorhexidine (PERIDEX) 0.12 % solution Swish and spit 15 ml once or twice daily to prevent oral/dental infections (Patient not taking: Reported on  05/21/2023)    No facility-administered encounter medications on file as of 05/21/2023.    Surgical History: Past Surgical History:  Procedure Laterality Date   CATARACT EXTRACTION W/PHACO Right 08/17/2018   Procedure: CATARACT EXTRACTION PHACO AND INTRAOCULAR LENS PLACEMENT ([facility])  RIGHT;  Surgeon: Sherry Mola, MD;  [facility];  Service: Ophthalmology;  Laterality: Right;   CATARACT EXTRACTION W/PHACO Left 11/09/2018   Procedure: CATARACT EXTRACTION PHACO AND INTRAOCULAR LENS PLACEMENT ([facility]) LEFT;  Surgeon: Sherry Mola, MD;  [facility];  Service: Ophthalmology;  Laterality: Left;   EXPLORATORY LAPAROTOMY      Medical History: Past Medical History:  Diagnosis Date   Anemia    Arthritis    lower back   High blood pressure    High cholesterol    History of actinic keratosis    Macular degeneration    recieving 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    Multiple myeloma (HCC)    Renal insufficiency    Wears dentures    partial upper    Family History: Family History  Problem Relation Age of Onset   Breast cancer Maternal Grandmother 27   CAD Mother    CVA Mother     Social History   Socioeconomic History   Marital status: Married    Spouse name: Not on file   Number of children: 1   Years of education: Not on file   Highest education level: Not on file  Occupational History   Not on file  Tobacco Use   Smoking status: Never   Smokeless tobacco: Never  Vaping Use   Vaping Use: Never used  Substance and Sexual Activity   Alcohol use: No   Drug use: No   Sexual activity: Not Currently    Birth control/protection: None  Other Topics Concern   Not on file  Social History Narrative   Lives at home with her husband, independent at baseline   Social Determinants of Health   Financial Resource Strain: Low Risk  (04/16/2021)   Overall Financial Resource Strain (CARDIA)    Difficulty of  Paying Living Expenses: Not very hard  Food Insecurity: No Food Insecurity (02/22/2023)   Hunger Vital Sign    Worried About Running Out of Food in the Last Year: Never true    Ran Out of Food in the Last Year: Never true  Transportation Needs: No Transportation Needs (02/22/2023)   PRAPARE - Transportation    Lack of Transportation (Medical): No    Lack of Transportation (Non-Medical): No  Physical Activity: Inactive (03/22/2018)   Exercise Vital Sign    Days of Exercise per Week: 0 days    Minutes of Exercise per Session: 0 min  Stress: No Stress Concern Present (03/22/2018)   [employer] of Occupational Health - Occupational Stress Questionnaire  Feeling of Stress : Only a little  Social Connections: Moderately Integrated (03/22/2018)   Social Connection and Isolation Panel [NHANES]    Frequency of Communication with Friends and Family: More than three times a week    Frequency of Social Gatherings with Friends and Family: Three times a week    Attends Religious Services: 1 to 4 times per year    Active Member of Clubs or Organizations: No    Attends Banker Meetings: Never    Marital Status: Married  Catering manager Violence: Not At Risk (02/22/2023)   Humiliation, Afraid, Rape, and Kick questionnaire    Fear of Current or Ex-Partner: No    Emotionally Abused: No    Physically Abused: No    Sexually Abused: No      Review of Systems  Constitutional:  Negative for chills, fatigue and unexpected weight change.  HENT:  Negative for congestion, rhinorrhea, sneezing and sore throat.   Eyes:  Negative for redness.  Respiratory: Negative.  Negative for cough, chest tightness, shortness of breath and wheezing.   Cardiovascular: Negative.  Negative for chest pain and palpitations.  Gastrointestinal:  Negative for abdominal pain, constipation, diarrhea, nausea and vomiting.  Genitourinary:  Negative for dysuria and frequency.  Musculoskeletal:  Positive for  arthralgias and back pain. Negative for joint swelling and neck pain.  Skin:  Negative for rash.  Neurological: Negative.  Negative for tremors and numbness.  Hematological:  Negative for adenopathy. Does not bruise/bleed easily.  Psychiatric/Behavioral:  Negative for behavioral problems (Depression), self-injury, sleep disturbance and suicidal ideas. The patient is nervous/anxious.     Vital Signs: BP 130/72   Pulse 86   Temp (!) 96.9 F (36.1 C)   Resp 16   Ht 5\' 3"  (1.6 m)   Wt 125 lb 3.2 oz (56.8 kg)   SpO2 95%   BMI 22.18 kg/m    Physical Exam Vitals reviewed.  Constitutional:      General: She is not in acute distress.    Appearance: Normal appearance. She is normal weight. She is not ill-appearing.  HENT:     Head: Normocephalic and atraumatic.  Eyes:     Pupils: Pupils are equal, round, and reactive to light.  Cardiovascular:     Rate and Rhythm: Normal rate and regular rhythm.  Pulmonary:     Effort: Pulmonary effort is normal. No respiratory distress.  Neurological:     Mental Status: She is alert and oriented to person, place, and time.  Psychiatric:        Mood and Affect: Mood normal.        Behavior: Behavior normal.        Assessment/Plan: 1. Mixed hyperlipidemia Continue atorvastatin as prescribed  - atorvastatin (LIPITOR) 10 MG tablet; Take 1 tablet (10 mg total) by mouth daily.  Dispense: 90 tablet; Refill: 1  2. Acute bilateral low back pain without sciatica Resolved   3. Multiple myeloma not having achieved remission (HCC) Biweekly chemo treatments, managed by oncology  4. Generalized anxiety disorder Continue prn alprazolam as prescribed.  - ALPRAZolam (XANAX) 0.25 MG tablet; Take 1 tablet (0.25 mg total) by mouth 2 (two) times daily as needed for anxiety or sleep.  Dispense: 60 tablet; Refill: 2   General Counseling: Domingue verbalizes understanding of the findings of todays visit and agrees with plan of treatment. I have discussed any  further diagnostic evaluation that may be needed or ordered today. We also reviewed her medications today. she has been  encouraged to call the office with any questions or concerns that should arise related to todays visit.    No orders of the defined types were placed in this encounter.   Meds ordered this encounter  Medications   DISCONTD: ALPRAZolam (XANAX) 0.25 MG tablet    Sig: Take 1 tablet (0.25 mg total) by mouth 2 (two) times daily as needed for anxiety or sleep.    Dispense:  60 tablet    Refill:  2    Refills ordered   DISCONTD: atorvastatin (LIPITOR) 10 MG tablet    Sig: Take 1 tablet (10 mg total) by mouth daily.    Dispense:  90 tablet    Refill:  1   ALPRAZolam (XANAX) 0.25 MG tablet    Sig: Take 1 tablet (0.25 mg total) by mouth 2 (two) times daily as needed for anxiety or sleep.    Dispense:  60 tablet    Refill:  2    Refills ordered   atorvastatin (LIPITOR) 10 MG tablet    Sig: Take 1 tablet (10 mg total) by mouth daily.    Dispense:  90 tablet    Refill:  1    Return in about 3 months (around 08/12/2023) for F/U, anxiety med refill, Ryosuke Ericksen PCP.   Total time spent:30 Minutes Time spent includes review of chart, medications, test results, and follow up plan with the patient.   [place] Controlled Substance Database was reviewed by me.  This patient was seen by Sallyanne Kuster, FNP-C in collaboration with Dr. Beverely Risen as a part of collaborative care agreement.   Bryauna Byrum R. Tedd Sias, MSN, FNP-C Internal medicine

## 2023-05-25 ENCOUNTER — Ambulatory Visit
Admission: RE | Admit: 2023-05-25 | Discharge: 2023-05-25 | Disposition: A | Discharge status: Home / Self Care | Payer: PPO | Source: Ambulatory Visit | Attending: Nurse Practitioner | Admitting: Nurse Practitioner

## 2023-05-25 DIAGNOSIS — Z1231 Encounter for screening mammogram for malignant neoplasm of breast: Secondary | ICD-10-CM | POA: Insufficient documentation

## 2023-05-26 DIAGNOSIS — C9002 Multiple myeloma in relapse: Secondary | ICD-10-CM | POA: Diagnosis not present

## 2023-05-26 DIAGNOSIS — R0609 Other forms of dyspnea: Secondary | ICD-10-CM | POA: Diagnosis not present

## 2023-05-26 DIAGNOSIS — D61818 Other pancytopenia: Secondary | ICD-10-CM | POA: Diagnosis not present

## 2023-05-26 DIAGNOSIS — Z5112 Encounter for antineoplastic immunotherapy: Secondary | ICD-10-CM | POA: Diagnosis not present

## 2023-05-26 DIAGNOSIS — M8588 Other specified disorders of bone density and structure, other site: Secondary | ICD-10-CM | POA: Diagnosis not present

## 2023-05-26 DIAGNOSIS — R0789 Other chest pain: Secondary | ICD-10-CM | POA: Diagnosis not present

## 2023-05-26 DIAGNOSIS — E041 Nontoxic single thyroid nodule: Secondary | ICD-10-CM | POA: Diagnosis not present

## 2023-05-26 DIAGNOSIS — N261 Atrophy of kidney (terminal): Secondary | ICD-10-CM | POA: Diagnosis not present

## 2023-05-26 DIAGNOSIS — N281 Cyst of kidney, acquired: Secondary | ICD-10-CM | POA: Diagnosis not present

## 2023-05-26 DIAGNOSIS — H3530 Unspecified macular degeneration: Secondary | ICD-10-CM | POA: Diagnosis not present

## 2023-05-26 MED ORDER — ALPRAZOLAM 0.25 MG PO TABS
0.2500 mg | ORAL_TABLET | Freq: Two times a day (BID) | ORAL | 2 refills | Status: DC | PRN
Start: 2023-05-26 — End: 2024-06-20

## 2023-05-26 MED ORDER — ATORVASTATIN CALCIUM 10 MG PO TABS
10.0000 mg | ORAL_TABLET | Freq: Every day | ORAL | 1 refills | Status: DC
Start: 2023-05-26 — End: 2023-12-23

## 2023-05-26 NOTE — Addendum Note (Signed)
Addended by: Sallyanne Kuster on: 05/26/2023 01:23 PM   Modules accepted: Orders

## 2023-06-09 DIAGNOSIS — R0789 Other chest pain: Secondary | ICD-10-CM | POA: Diagnosis not present

## 2023-06-09 DIAGNOSIS — R0609 Other forms of dyspnea: Secondary | ICD-10-CM | POA: Diagnosis not present

## 2023-06-09 DIAGNOSIS — Z5112 Encounter for antineoplastic immunotherapy: Secondary | ICD-10-CM | POA: Diagnosis not present

## 2023-06-09 DIAGNOSIS — C9002 Multiple myeloma in relapse: Secondary | ICD-10-CM | POA: Diagnosis not present

## 2023-06-14 ENCOUNTER — Other Ambulatory Visit: Payer: Self-pay | Admitting: Nurse Practitioner

## 2023-06-21 DIAGNOSIS — H353221 Exudative age-related macular degeneration, left eye, with active choroidal neovascularization: Secondary | ICD-10-CM | POA: Diagnosis not present

## 2023-06-23 DIAGNOSIS — F419 Anxiety disorder, unspecified: Secondary | ICD-10-CM | POA: Diagnosis not present

## 2023-06-23 DIAGNOSIS — Z5112 Encounter for antineoplastic immunotherapy: Secondary | ICD-10-CM | POA: Diagnosis not present

## 2023-06-23 DIAGNOSIS — R0609 Other forms of dyspnea: Secondary | ICD-10-CM | POA: Diagnosis not present

## 2023-06-23 DIAGNOSIS — F32A Depression, unspecified: Secondary | ICD-10-CM | POA: Diagnosis not present

## 2023-06-23 DIAGNOSIS — E041 Nontoxic single thyroid nodule: Secondary | ICD-10-CM | POA: Diagnosis not present

## 2023-06-23 DIAGNOSIS — C9002 Multiple myeloma in relapse: Secondary | ICD-10-CM | POA: Diagnosis not present

## 2023-06-23 DIAGNOSIS — Z886 Allergy status to analgesic agent status: Secondary | ICD-10-CM | POA: Diagnosis not present

## 2023-06-23 DIAGNOSIS — R0789 Other chest pain: Secondary | ICD-10-CM | POA: Diagnosis not present

## 2023-06-23 DIAGNOSIS — D801 Nonfamilial hypogammaglobulinemia: Secondary | ICD-10-CM | POA: Diagnosis not present

## 2023-06-23 DIAGNOSIS — H3530 Unspecified macular degeneration: Secondary | ICD-10-CM | POA: Diagnosis not present

## 2023-06-23 DIAGNOSIS — Z882 Allergy status to sulfonamides status: Secondary | ICD-10-CM | POA: Diagnosis not present

## 2023-06-23 DIAGNOSIS — Z885 Allergy status to narcotic agent status: Secondary | ICD-10-CM | POA: Diagnosis not present

## 2023-06-23 DIAGNOSIS — D61818 Other pancytopenia: Secondary | ICD-10-CM | POA: Diagnosis not present

## 2023-06-23 DIAGNOSIS — Z79899 Other long term (current) drug therapy: Secondary | ICD-10-CM | POA: Diagnosis not present

## 2023-06-23 DIAGNOSIS — Z881 Allergy status to other antibiotic agents status: Secondary | ICD-10-CM | POA: Diagnosis not present

## 2023-06-23 DIAGNOSIS — Z880 Allergy status to penicillin: Secondary | ICD-10-CM | POA: Diagnosis not present

## 2023-07-07 DIAGNOSIS — R0789 Other chest pain: Secondary | ICD-10-CM | POA: Diagnosis not present

## 2023-07-07 DIAGNOSIS — R0609 Other forms of dyspnea: Secondary | ICD-10-CM | POA: Diagnosis not present

## 2023-07-07 DIAGNOSIS — Z5112 Encounter for antineoplastic immunotherapy: Secondary | ICD-10-CM | POA: Diagnosis not present

## 2023-07-07 DIAGNOSIS — C9002 Multiple myeloma in relapse: Secondary | ICD-10-CM | POA: Diagnosis not present

## 2023-07-21 DIAGNOSIS — C9002 Multiple myeloma in relapse: Secondary | ICD-10-CM | POA: Diagnosis not present

## 2023-07-21 DIAGNOSIS — R0609 Other forms of dyspnea: Secondary | ICD-10-CM | POA: Diagnosis not present

## 2023-07-21 DIAGNOSIS — R0789 Other chest pain: Secondary | ICD-10-CM | POA: Diagnosis not present

## 2023-08-04 DIAGNOSIS — R0789 Other chest pain: Secondary | ICD-10-CM | POA: Diagnosis not present

## 2023-08-04 DIAGNOSIS — Z5112 Encounter for antineoplastic immunotherapy: Secondary | ICD-10-CM | POA: Diagnosis not present

## 2023-08-04 DIAGNOSIS — C9002 Multiple myeloma in relapse: Secondary | ICD-10-CM | POA: Diagnosis not present

## 2023-08-04 DIAGNOSIS — R0609 Other forms of dyspnea: Secondary | ICD-10-CM | POA: Diagnosis not present

## 2023-08-09 DIAGNOSIS — H353221 Exudative age-related macular degeneration, left eye, with active choroidal neovascularization: Secondary | ICD-10-CM | POA: Diagnosis not present

## 2023-08-18 DIAGNOSIS — C9002 Multiple myeloma in relapse: Secondary | ICD-10-CM | POA: Diagnosis not present

## 2023-08-18 DIAGNOSIS — H3530 Unspecified macular degeneration: Secondary | ICD-10-CM | POA: Diagnosis not present

## 2023-08-18 DIAGNOSIS — T451X5A Adverse effect of antineoplastic and immunosuppressive drugs, initial encounter: Secondary | ICD-10-CM | POA: Diagnosis not present

## 2023-08-18 DIAGNOSIS — Z5112 Encounter for antineoplastic immunotherapy: Secondary | ICD-10-CM | POA: Diagnosis not present

## 2023-08-18 DIAGNOSIS — R0789 Other chest pain: Secondary | ICD-10-CM | POA: Diagnosis not present

## 2023-08-18 DIAGNOSIS — D801 Nonfamilial hypogammaglobulinemia: Secondary | ICD-10-CM | POA: Diagnosis not present

## 2023-08-18 DIAGNOSIS — D61810 Antineoplastic chemotherapy induced pancytopenia: Secondary | ICD-10-CM | POA: Diagnosis not present

## 2023-08-18 DIAGNOSIS — R0609 Other forms of dyspnea: Secondary | ICD-10-CM | POA: Diagnosis not present

## 2023-08-18 DIAGNOSIS — F419 Anxiety disorder, unspecified: Secondary | ICD-10-CM | POA: Diagnosis not present

## 2023-08-18 DIAGNOSIS — F32A Depression, unspecified: Secondary | ICD-10-CM | POA: Diagnosis not present

## 2023-08-18 DIAGNOSIS — R0602 Shortness of breath: Secondary | ICD-10-CM | POA: Diagnosis not present

## 2023-08-18 DIAGNOSIS — G893 Neoplasm related pain (acute) (chronic): Secondary | ICD-10-CM | POA: Diagnosis not present

## 2023-08-18 DIAGNOSIS — E041 Nontoxic single thyroid nodule: Secondary | ICD-10-CM | POA: Diagnosis not present

## 2023-08-18 DIAGNOSIS — I10 Essential (primary) hypertension: Secondary | ICD-10-CM | POA: Diagnosis not present

## 2023-09-01 DIAGNOSIS — Z5112 Encounter for antineoplastic immunotherapy: Secondary | ICD-10-CM | POA: Diagnosis not present

## 2023-09-01 DIAGNOSIS — R0609 Other forms of dyspnea: Secondary | ICD-10-CM | POA: Diagnosis not present

## 2023-09-01 DIAGNOSIS — C9002 Multiple myeloma in relapse: Secondary | ICD-10-CM | POA: Diagnosis not present

## 2023-09-01 DIAGNOSIS — R0789 Other chest pain: Secondary | ICD-10-CM | POA: Diagnosis not present

## 2023-09-01 DIAGNOSIS — Z95828 Presence of other vascular implants and grafts: Secondary | ICD-10-CM | POA: Diagnosis not present

## 2023-09-06 ENCOUNTER — Other Ambulatory Visit: Payer: Self-pay | Admitting: Nurse Practitioner

## 2023-09-15 DIAGNOSIS — R0609 Other forms of dyspnea: Secondary | ICD-10-CM | POA: Diagnosis not present

## 2023-09-15 DIAGNOSIS — R0789 Other chest pain: Secondary | ICD-10-CM | POA: Diagnosis not present

## 2023-09-15 DIAGNOSIS — Z5112 Encounter for antineoplastic immunotherapy: Secondary | ICD-10-CM | POA: Diagnosis not present

## 2023-09-15 DIAGNOSIS — C9002 Multiple myeloma in relapse: Secondary | ICD-10-CM | POA: Diagnosis not present

## 2023-09-20 DIAGNOSIS — H353221 Exudative age-related macular degeneration, left eye, with active choroidal neovascularization: Secondary | ICD-10-CM | POA: Diagnosis not present

## 2023-09-24 DIAGNOSIS — H40003 Preglaucoma, unspecified, bilateral: Secondary | ICD-10-CM | POA: Diagnosis not present

## 2023-09-29 DIAGNOSIS — R0789 Other chest pain: Secondary | ICD-10-CM | POA: Diagnosis not present

## 2023-09-29 DIAGNOSIS — C9002 Multiple myeloma in relapse: Secondary | ICD-10-CM | POA: Diagnosis not present

## 2023-09-29 DIAGNOSIS — Z5112 Encounter for antineoplastic immunotherapy: Secondary | ICD-10-CM | POA: Diagnosis not present

## 2023-09-29 DIAGNOSIS — R0609 Other forms of dyspnea: Secondary | ICD-10-CM | POA: Diagnosis not present

## 2023-10-11 ENCOUNTER — Other Ambulatory Visit: Payer: Self-pay | Admitting: Nurse Practitioner

## 2023-10-11 DIAGNOSIS — K529 Noninfective gastroenteritis and colitis, unspecified: Secondary | ICD-10-CM

## 2023-10-13 DIAGNOSIS — Z79899 Other long term (current) drug therapy: Secondary | ICD-10-CM | POA: Diagnosis not present

## 2023-10-13 DIAGNOSIS — Z5112 Encounter for antineoplastic immunotherapy: Secondary | ICD-10-CM | POA: Diagnosis not present

## 2023-10-13 DIAGNOSIS — E041 Nontoxic single thyroid nodule: Secondary | ICD-10-CM | POA: Diagnosis not present

## 2023-10-13 DIAGNOSIS — H3530 Unspecified macular degeneration: Secondary | ICD-10-CM | POA: Diagnosis not present

## 2023-10-13 DIAGNOSIS — T451X5A Adverse effect of antineoplastic and immunosuppressive drugs, initial encounter: Secondary | ICD-10-CM | POA: Diagnosis not present

## 2023-10-13 DIAGNOSIS — D61818 Other pancytopenia: Secondary | ICD-10-CM | POA: Diagnosis not present

## 2023-10-13 DIAGNOSIS — D84821 Immunodeficiency due to drugs: Secondary | ICD-10-CM | POA: Diagnosis not present

## 2023-10-13 DIAGNOSIS — R0609 Other forms of dyspnea: Secondary | ICD-10-CM | POA: Diagnosis not present

## 2023-10-13 DIAGNOSIS — F419 Anxiety disorder, unspecified: Secondary | ICD-10-CM | POA: Diagnosis not present

## 2023-10-13 DIAGNOSIS — D801 Nonfamilial hypogammaglobulinemia: Secondary | ICD-10-CM | POA: Diagnosis not present

## 2023-10-13 DIAGNOSIS — C9002 Multiple myeloma in relapse: Secondary | ICD-10-CM | POA: Diagnosis not present

## 2023-10-13 DIAGNOSIS — F32A Depression, unspecified: Secondary | ICD-10-CM | POA: Diagnosis not present

## 2023-10-13 DIAGNOSIS — R0789 Other chest pain: Secondary | ICD-10-CM | POA: Diagnosis not present

## 2023-10-13 DIAGNOSIS — Z9221 Personal history of antineoplastic chemotherapy: Secondary | ICD-10-CM | POA: Diagnosis not present

## 2023-10-20 DIAGNOSIS — C9002 Multiple myeloma in relapse: Secondary | ICD-10-CM | POA: Diagnosis not present

## 2023-10-27 DIAGNOSIS — Z5112 Encounter for antineoplastic immunotherapy: Secondary | ICD-10-CM | POA: Diagnosis not present

## 2023-10-27 DIAGNOSIS — C9002 Multiple myeloma in relapse: Secondary | ICD-10-CM | POA: Diagnosis not present

## 2023-10-27 DIAGNOSIS — R0609 Other forms of dyspnea: Secondary | ICD-10-CM | POA: Diagnosis not present

## 2023-10-27 DIAGNOSIS — R0789 Other chest pain: Secondary | ICD-10-CM | POA: Diagnosis not present

## 2023-11-01 DIAGNOSIS — H353221 Exudative age-related macular degeneration, left eye, with active choroidal neovascularization: Secondary | ICD-10-CM | POA: Diagnosis not present

## 2023-11-10 DIAGNOSIS — R0789 Other chest pain: Secondary | ICD-10-CM | POA: Diagnosis not present

## 2023-11-10 DIAGNOSIS — Z5112 Encounter for antineoplastic immunotherapy: Secondary | ICD-10-CM | POA: Diagnosis not present

## 2023-11-10 DIAGNOSIS — C9002 Multiple myeloma in relapse: Secondary | ICD-10-CM | POA: Diagnosis not present

## 2023-11-10 DIAGNOSIS — R0609 Other forms of dyspnea: Secondary | ICD-10-CM | POA: Diagnosis not present

## 2023-11-24 DIAGNOSIS — R0609 Other forms of dyspnea: Secondary | ICD-10-CM | POA: Diagnosis not present

## 2023-11-24 DIAGNOSIS — C9002 Multiple myeloma in relapse: Secondary | ICD-10-CM | POA: Diagnosis not present

## 2023-11-24 DIAGNOSIS — Z5112 Encounter for antineoplastic immunotherapy: Secondary | ICD-10-CM | POA: Diagnosis not present

## 2023-11-24 DIAGNOSIS — R0789 Other chest pain: Secondary | ICD-10-CM | POA: Diagnosis not present

## 2023-12-07 DIAGNOSIS — D2261 Melanocytic nevi of right upper limb, including shoulder: Secondary | ICD-10-CM | POA: Diagnosis not present

## 2023-12-07 DIAGNOSIS — D2272 Melanocytic nevi of left lower limb, including hip: Secondary | ICD-10-CM | POA: Diagnosis not present

## 2023-12-07 DIAGNOSIS — L578 Other skin changes due to chronic exposure to nonionizing radiation: Secondary | ICD-10-CM | POA: Diagnosis not present

## 2023-12-07 DIAGNOSIS — D2262 Melanocytic nevi of left upper limb, including shoulder: Secondary | ICD-10-CM | POA: Diagnosis not present

## 2023-12-07 DIAGNOSIS — L821 Other seborrheic keratosis: Secondary | ICD-10-CM | POA: Diagnosis not present

## 2023-12-07 DIAGNOSIS — D2271 Melanocytic nevi of right lower limb, including hip: Secondary | ICD-10-CM | POA: Diagnosis not present

## 2023-12-07 DIAGNOSIS — D225 Melanocytic nevi of trunk: Secondary | ICD-10-CM | POA: Diagnosis not present

## 2023-12-07 DIAGNOSIS — X32XXXA Exposure to sunlight, initial encounter: Secondary | ICD-10-CM | POA: Diagnosis not present

## 2023-12-08 DIAGNOSIS — C9002 Multiple myeloma in relapse: Secondary | ICD-10-CM | POA: Diagnosis not present

## 2023-12-08 DIAGNOSIS — Z5112 Encounter for antineoplastic immunotherapy: Secondary | ICD-10-CM | POA: Diagnosis not present

## 2023-12-08 DIAGNOSIS — Z881 Allergy status to other antibiotic agents status: Secondary | ICD-10-CM | POA: Diagnosis not present

## 2023-12-08 DIAGNOSIS — Z880 Allergy status to penicillin: Secondary | ICD-10-CM | POA: Diagnosis not present

## 2023-12-08 DIAGNOSIS — E041 Nontoxic single thyroid nodule: Secondary | ICD-10-CM | POA: Diagnosis not present

## 2023-12-08 DIAGNOSIS — C9001 Multiple myeloma in remission: Secondary | ICD-10-CM | POA: Diagnosis not present

## 2023-12-08 DIAGNOSIS — R0789 Other chest pain: Secondary | ICD-10-CM | POA: Diagnosis not present

## 2023-12-08 DIAGNOSIS — Z882 Allergy status to sulfonamides status: Secondary | ICD-10-CM | POA: Diagnosis not present

## 2023-12-08 DIAGNOSIS — R0609 Other forms of dyspnea: Secondary | ICD-10-CM | POA: Diagnosis not present

## 2023-12-08 DIAGNOSIS — H3530 Unspecified macular degeneration: Secondary | ICD-10-CM | POA: Diagnosis not present

## 2023-12-08 DIAGNOSIS — D61818 Other pancytopenia: Secondary | ICD-10-CM | POA: Diagnosis not present

## 2023-12-08 DIAGNOSIS — Z885 Allergy status to narcotic agent status: Secondary | ICD-10-CM | POA: Diagnosis not present

## 2023-12-08 DIAGNOSIS — Z886 Allergy status to analgesic agent status: Secondary | ICD-10-CM | POA: Diagnosis not present

## 2023-12-10 ENCOUNTER — Telehealth: Payer: Self-pay | Admitting: Nurse Practitioner

## 2023-12-10 NOTE — Telephone Encounter (Signed)
12/28/21-12/28/23 MR uploaded to [employer]; [email or web address] @ 9:36 a.m.-Toni

## 2023-12-13 DIAGNOSIS — H353221 Exudative age-related macular degeneration, left eye, with active choroidal neovascularization: Secondary | ICD-10-CM | POA: Diagnosis not present

## 2023-12-20 ENCOUNTER — Encounter: Payer: Self-pay | Admitting: Nurse Practitioner

## 2023-12-20 ENCOUNTER — Ambulatory Visit (INDEPENDENT_AMBULATORY_CARE_PROVIDER_SITE_OTHER): Payer: PPO | Admitting: Nurse Practitioner

## 2023-12-20 VITALS — BP 135/63 | HR 72 | Temp 98.6°F | Resp 16 | Ht 63.0 in | Wt 123.2 lb

## 2023-12-20 DIAGNOSIS — C9000 Multiple myeloma not having achieved remission: Secondary | ICD-10-CM

## 2023-12-20 DIAGNOSIS — E782 Mixed hyperlipidemia: Secondary | ICD-10-CM

## 2023-12-20 DIAGNOSIS — F411 Generalized anxiety disorder: Secondary | ICD-10-CM

## 2023-12-20 DIAGNOSIS — E559 Vitamin D deficiency, unspecified: Secondary | ICD-10-CM

## 2023-12-20 DIAGNOSIS — Z0000 Encounter for general adult medical examination without abnormal findings: Secondary | ICD-10-CM | POA: Diagnosis not present

## 2023-12-20 DIAGNOSIS — I10 Essential (primary) hypertension: Secondary | ICD-10-CM

## 2023-12-20 NOTE — Progress Notes (Cosign Needed)
[facility] [address] [place], [place] [address]  Internal MEDICINE  Office Visit Note  Patient Name: Sherry Oneal  [id number]  [id number]  Date of Service: 12/20/2023  Chief Complaint  Patient presents with   Hyperlipidemia   Hypertension   Medicare Wellness    HPI [name] presents for a medicare annual wellness visit. Well-appearing 77 y.o. female with multiple myeloma, hypertension, GERD, anxiety, low B12 level, and insomnia Routine CRC screening: discontinued  Routine mammogram: due in may 2025 DEXA scan: done in 2015 Labs: needs cholesterol panel checked.  New or worsening pain: chronic pain is controlled currently related to multiple myeloma.  Had RSV vaccine last year and had prevnar 20 vaccine earlier this year in April.   Sees [facility] oncology -- multiple myeloma Goes to [facility] for macular degeneration -- gets injections for this  Dermatology -- goes to [facility] dermatology -- diagnosed with seborrheic keratoses.      12/20/2023    9:02 AM 12/14/2022    8:59 AM 12/12/2021    9:47 AM  MMSE - Mini Mental State Exam  Orientation to time 5 5 5   Orientation to Place 5 5 5   Registration 3 3 3   Attention/ Calculation 5 5 5   Recall 3 3 3   Language- name 2 objects 2 2 2   Language- repeat 1 1 1   Language- follow 3 step command 3 3 3   Language- read & follow direction 1 1 1   Write a sentence 1 1 1   Copy design 1 1 1   Total score 30 30 30     Functional Status Survey: Is the patient deaf or have difficulty hearing?: Yes Does the patient have difficulty seeing, even when wearing glasses/contacts?: No Does the patient have difficulty concentrating, remembering, or making decisions?: No Does the patient have difficulty walking or climbing stairs?: No Does the patient have difficulty dressing or bathing?: No Does the patient have difficulty doing errands alone such as visiting a doctor's office or shopping?: Yes     12/12/2021    9:45 AM  04/16/2022   10:30 AM 08/13/2022    8:27 AM 12/14/2022    8:57 AM 12/20/2023    9:00 AM  Fall Risk  Falls in the past year? 0 0 0 0 0  Was there an injury with Fall?    0 0  Fall Risk Category Calculator    0 0  Fall Risk Category (Retired)    Low   (RETIRED) Patient Fall Risk Level Low fall risk Low fall risk  Low fall risk   Patient at Risk for Falls Due to No Fall Risks No Fall Risks  No Fall Risks No Fall Risks  Fall risk Follow up Falls evaluation completed Falls evaluation completed  Falls evaluation completed Falls evaluation completed       12/20/2023    9:00 AM  Depression screen PHQ 2/9  Decreased Interest 0  Down, Depressed, Hopeless 0  PHQ - 2 Score 0       Current Medication: Outpatient Encounter Medications as of 12/20/2023  Medication Sig Note   acetaminophen (TYLENOL) 500 MG tablet Take 650 mg by mouth in the morning and at bedtime.    ALPRAZolam (XANAX) 0.25 MG tablet Take 1 tablet (0.25 mg total) by mouth 2 (two) times daily as needed for anxiety or sleep.    atorvastatin (LIPITOR) 10 MG tablet Take 1 tablet (10 mg total) by mouth daily.    Cranberry 500 MG CAPS Take 500  mg by mouth every morning.    dapsone 100 MG tablet Take 100 mg by mouth daily.    dexamethasone (DECADRON) 4 MG tablet Take 20 mg by mouth daily. 12/14/2022: For chemo   dicyclomine (BENTYL) 10 MG capsule TAKE 1 CAPSULE BY MOUTH THREE TIMES DAILY AS NEEDED WITH MEALS    diphenhydrAMINE (BENADRYL) 25 MG tablet Take 25 mg by mouth daily as needed.    faricimab-svoa (VABYSMO) 6 MG/0.05ML SOLN intravitreal injection by Intravitreal route.    fluticasone (FLONASE) 50 MCG/ACT nasal spray Use 2 spray(s) in each nostril once daily    Multiple Vitamins-Minerals (PRESERVISION AREDS PO) Take 1 tablet by mouth every morning.    omeprazole (PRILOSEC) 20 MG capsule Take 20 mg by mouth daily.    ondansetron (ZOFRAN-ODT) 4 MG disintegrating tablet DISSOLVE 1 TABLET IN MOUTH EVERY 8 HOURS AS NEEDED FOR  NAUSEA    oxyCODONE (OXY IR/ROXICODONE) 5 MG immediate release tablet Take 5 mg by mouth every 4 (four) hours as needed.    polyethylene glycol (MIRALAX / GLYCOLAX) packet Take 17 g by mouth daily as needed.    prochlorperazine (COMPAZINE) 5 MG tablet Take 5 mg by mouth every 8 (eight) hours as needed.    promethazine (PHENERGAN) 25 MG tablet Take 1 tablet (25 mg total) by mouth every 6 (six) hours as needed for nausea or vomiting. (Patient taking differently: Take 12.5 mg by mouth every 6 (six) hours as needed for nausea or vomiting.)    senna (SENOKOT) 8.6 MG tablet Take 1 tablet by mouth daily as needed for constipation.    TECLISTAMAB-CQYV [place] Inject into the skin.    traZODone (DESYREL) 50 MG tablet Take 50 mg by mouth at bedtime.    valACYclovir (VALTREX) 500 MG tablet Take 500 mg by mouth daily.    vitamin B-12 (CYANOCOBALAMIN) 500 MCG tablet Take 500 mcg by mouth daily.    No facility-administered encounter medications on file as of 12/20/2023.    Surgical History: Past Surgical History:  Procedure Laterality Date   CATARACT EXTRACTION W/PHACO Right 08/17/2018   Procedure: CATARACT EXTRACTION PHACO AND INTRAOCULAR LENS PLACEMENT (IOC)  RIGHT;  Surgeon: Lockie Mola, MD;  Location[facility];  Service: Ophthalmology;  Laterality: Right;   CATARACT EXTRACTION W/PHACO Left 11/09/2018   Procedure: CATARACT EXTRACTION PHACO AND INTRAOCULAR LENS PLACEMENT (IOC) LEFT;  Surgeon: Lockie Mola, MD;  Location: [facility];  Service: Ophthalmology;  Laterality: Left;   EXPLORATORY LAPAROTOMY      Medical History: Past Medical History:  Diagnosis Date   Anemia    Arthritis    lower back   High blood pressure    High cholesterol    History of actinic keratosis    Macular degeneration    recieving 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    Multiple myeloma (HCC)    Renal insufficiency    Wears dentures    partial upper    Family  History: Family History  Problem Relation Age of Onset   Breast cancer Maternal Grandmother 46   CAD Mother    CVA Mother     Social History   Socioeconomic History   Marital status: Married    Spouse name: Not on file   Number of children: 1   Years of education: Not on file   Highest education level: Not on file  Occupational History   Not on file  Tobacco Use   Smoking status: Never   Smokeless tobacco:  Never  Vaping Use   Vaping status: Never Used  Substance and Sexual Activity   Alcohol use: No   Drug use: No   Sexual activity: Not Currently    Birth control/protection: None  Other Topics Concern   Not on file  Social History Narrative   Lives at home with her husband, independent at baseline   Social Drivers of Health   Financial Resource Strain: Low Risk  (11/09/2022)   Received from [facility]   Overall Financial Resource Strain (CARDIA)    Difficulty of Paying Living Expenses: Not hard at all  Food Insecurity: No Food Insecurity (02/22/2023)   Hunger Vital Sign    Worried About Running Out of Food in the Last Year: Never true    Ran Out of Food in the Last Year: Never true  Transportation Needs: No Transportation Needs (02/22/2023)   PRAPARE - Transportation    Lack of Transportation (Medical): No    Lack of Transportation (Non-Medical): No  Physical Activity: Inactive (03/22/2018)   Exercise Vital Sign    Days of Exercise per Week: 0 days    Minutes of Exercise per Session: 0 min  Stress: No Stress Concern Present (03/22/2018)   [employer] of Occupational Health - Occupational Stress Questionnaire    Feeling of Stress : Only a little  Social Connections: Moderately Integrated (03/22/2018)   Social Connection and Isolation Panel [NHANES]    Frequency of Communication with Friends and Family: More than three times a week    Frequency of Social Gatherings with Friends and Family: Three times a week    Attends Religious Services:  1 to 4 times per year    Active Member of Clubs or Organizations: No    Attends Banker Meetings: Never    Marital Status: Married  Catering manager Violence: Not At Risk (02/22/2023)   Humiliation, Afraid, Rape, and Kick questionnaire    Fear of Current or Ex-Partner: No    Emotionally Abused: No    Physically Abused: No    Sexually Abused: No      Review of Systems  Constitutional:  Positive for appetite change and fatigue. Negative for activity change, chills, fever and unexpected weight change.  HENT:  Negative for congestion, dental problem, ear pain, rhinorrhea, sore throat and trouble swallowing.   Eyes: Negative.   Respiratory:  Negative for cough, chest tightness, shortness of breath and wheezing.   Cardiovascular: Negative.  Negative for chest pain and palpitations.  Gastrointestinal:  Positive for nausea. Negative for abdominal pain, blood in stool, constipation, diarrhea and vomiting.  Endocrine: Negative.   Genitourinary: Negative.  Negative for difficulty urinating, dysuria, frequency, hematuria and urgency.  Musculoskeletal: Negative.  Negative for arthralgias, back pain, joint swelling, myalgias and neck pain.  Skin: Negative.  Negative for rash and wound.  Allergic/Immunologic: Negative.  Negative for immunocompromised state.  Neurological: Negative.  Negative for dizziness, seizures, numbness and headaches.  Hematological: Negative.   Psychiatric/Behavioral: Negative.  Negative for behavioral problems, self-injury and suicidal ideas. The patient is not nervous/anxious.     Vital Signs: BP 135/63   Pulse 72   Temp 98.6 F (37 C)   Resp 16   Ht 5\' 3"  (1.6 m)   Wt 123 lb 3.2 oz (55.9 kg)   SpO2 94%   BMI 21.82 kg/m    Physical Exam Vitals reviewed.  Constitutional:      General: She is not in acute distress.    Appearance:  Normal appearance. She is normal weight. She is not ill-appearing.  HENT:     Head: Normocephalic and atraumatic.   Eyes:     Pupils: Pupils are equal, round, and reactive to light.  Cardiovascular:     Rate and Rhythm: Normal rate and regular rhythm.     Heart sounds: Normal heart sounds. No murmur heard. Pulmonary:     Effort: Pulmonary effort is normal. No respiratory distress.     Breath sounds: Normal breath sounds. No wheezing.  Neurological:     Mental Status: She is alert and oriented to person, place, and time.  Psychiatric:        Mood and Affect: Mood normal.        Behavior: Behavior normal.        Assessment/Plan: 1. Encounter for subsequent annual wellness visit (AWV) in Medicare patient (Primary) Age-appropriate preventive screenings and vaccinations discussed, annual physical exam completed. Routine labs for health maintenance written on prescription for patient to get done at [facility]. PHM updated.    2. Multiple myeloma not having achieved remission [facility]) Currently receiving chemo treatments via [facility] oncology  3. Mixed hyperlipidemia Repeat cholesterol panel with her upcoming labs via [facility]  4. Vitamin D deficiency Will repeat vitamin D level with her next labs via [facility]  5. Generalized anxiety disorder Continue alprazolam as needed as prescribed.      General Counseling: shanekqua wooderson understanding of the findings of todays visit and agrees with plan of treatment. I have discussed any further diagnostic evaluation that may be needed or ordered today. We also reviewed her medications today. she has been encouraged to call the office with any questions or concerns that should arise related to todays visit.    No orders of the defined types were placed in this encounter.   No orders of the defined types were placed in this encounter.   Return in about 6 months (around 06/19/2024) for F/U, Tayvian Holycross PCP.   Total time spent:30 Minutes Time spent includes review of chart, medications, test results, and follow up plan with the patient.   [place] Controlled Substance Database was  reviewed by me.  This patient was seen by Sallyanne Kuster, FNP-C in collaboration with Dr. Beverely Risen as a part of collaborative care agreement.  Nickoles Gregori R. Tedd Sias, MSN, FNP-C Internal medicine

## 2023-12-23 ENCOUNTER — Other Ambulatory Visit: Payer: Self-pay | Admitting: Nurse Practitioner

## 2023-12-23 DIAGNOSIS — E782 Mixed hyperlipidemia: Secondary | ICD-10-CM

## 2023-12-23 DIAGNOSIS — R7301 Impaired fasting glucose: Secondary | ICD-10-CM | POA: Diagnosis not present

## 2023-12-23 DIAGNOSIS — C9002 Multiple myeloma in relapse: Secondary | ICD-10-CM | POA: Diagnosis not present

## 2023-12-23 DIAGNOSIS — R0789 Other chest pain: Secondary | ICD-10-CM | POA: Diagnosis not present

## 2023-12-23 DIAGNOSIS — E559 Vitamin D deficiency, unspecified: Secondary | ICD-10-CM | POA: Diagnosis not present

## 2023-12-23 DIAGNOSIS — R0609 Other forms of dyspnea: Secondary | ICD-10-CM | POA: Diagnosis not present

## 2023-12-23 DIAGNOSIS — E785 Hyperlipidemia, unspecified: Secondary | ICD-10-CM | POA: Diagnosis not present

## 2024-01-05 DIAGNOSIS — R0609 Other forms of dyspnea: Secondary | ICD-10-CM | POA: Diagnosis not present

## 2024-01-05 DIAGNOSIS — R0789 Other chest pain: Secondary | ICD-10-CM | POA: Diagnosis not present

## 2024-01-05 DIAGNOSIS — C9002 Multiple myeloma in relapse: Secondary | ICD-10-CM | POA: Diagnosis not present

## 2024-01-19 DIAGNOSIS — F32A Depression, unspecified: Secondary | ICD-10-CM | POA: Diagnosis not present

## 2024-01-19 DIAGNOSIS — R6884 Jaw pain: Secondary | ICD-10-CM | POA: Diagnosis not present

## 2024-01-19 DIAGNOSIS — E611 Iron deficiency: Secondary | ICD-10-CM | POA: Diagnosis not present

## 2024-01-19 DIAGNOSIS — D508 Other iron deficiency anemias: Secondary | ICD-10-CM | POA: Diagnosis not present

## 2024-01-19 DIAGNOSIS — R0609 Other forms of dyspnea: Secondary | ICD-10-CM | POA: Diagnosis not present

## 2024-01-19 DIAGNOSIS — R5383 Other fatigue: Secondary | ICD-10-CM | POA: Diagnosis not present

## 2024-01-19 DIAGNOSIS — D61818 Other pancytopenia: Secondary | ICD-10-CM | POA: Diagnosis not present

## 2024-01-19 DIAGNOSIS — R0789 Other chest pain: Secondary | ICD-10-CM | POA: Diagnosis not present

## 2024-01-19 DIAGNOSIS — F419 Anxiety disorder, unspecified: Secondary | ICD-10-CM | POA: Diagnosis not present

## 2024-01-19 DIAGNOSIS — Z5112 Encounter for antineoplastic immunotherapy: Secondary | ICD-10-CM | POA: Diagnosis not present

## 2024-01-19 DIAGNOSIS — D649 Anemia, unspecified: Secondary | ICD-10-CM | POA: Diagnosis not present

## 2024-01-19 DIAGNOSIS — C9002 Multiple myeloma in relapse: Secondary | ICD-10-CM | POA: Diagnosis not present

## 2024-01-19 DIAGNOSIS — H3530 Unspecified macular degeneration: Secondary | ICD-10-CM | POA: Diagnosis not present

## 2024-01-19 DIAGNOSIS — E041 Nontoxic single thyroid nodule: Secondary | ICD-10-CM | POA: Diagnosis not present

## 2024-01-19 DIAGNOSIS — I10 Essential (primary) hypertension: Secondary | ICD-10-CM | POA: Diagnosis not present

## 2024-01-24 DIAGNOSIS — Z961 Presence of intraocular lens: Secondary | ICD-10-CM | POA: Diagnosis not present

## 2024-01-24 DIAGNOSIS — H353112 Nonexudative age-related macular degeneration, right eye, intermediate dry stage: Secondary | ICD-10-CM | POA: Diagnosis not present

## 2024-01-24 DIAGNOSIS — H43813 Vitreous degeneration, bilateral: Secondary | ICD-10-CM | POA: Diagnosis not present

## 2024-01-24 DIAGNOSIS — H353221 Exudative age-related macular degeneration, left eye, with active choroidal neovascularization: Secondary | ICD-10-CM | POA: Diagnosis not present

## 2024-02-02 DIAGNOSIS — R0789 Other chest pain: Secondary | ICD-10-CM | POA: Diagnosis not present

## 2024-02-02 DIAGNOSIS — D508 Other iron deficiency anemias: Secondary | ICD-10-CM | POA: Diagnosis not present

## 2024-02-02 DIAGNOSIS — R0609 Other forms of dyspnea: Secondary | ICD-10-CM | POA: Diagnosis not present

## 2024-02-02 DIAGNOSIS — Z5112 Encounter for antineoplastic immunotherapy: Secondary | ICD-10-CM | POA: Diagnosis not present

## 2024-02-02 DIAGNOSIS — C9002 Multiple myeloma in relapse: Secondary | ICD-10-CM | POA: Diagnosis not present

## 2024-02-09 DIAGNOSIS — R0789 Other chest pain: Secondary | ICD-10-CM | POA: Diagnosis not present

## 2024-02-09 DIAGNOSIS — D508 Other iron deficiency anemias: Secondary | ICD-10-CM | POA: Diagnosis not present

## 2024-02-09 DIAGNOSIS — C9002 Multiple myeloma in relapse: Secondary | ICD-10-CM | POA: Diagnosis not present

## 2024-02-15 DIAGNOSIS — R0789 Other chest pain: Secondary | ICD-10-CM | POA: Diagnosis not present

## 2024-02-15 DIAGNOSIS — C9002 Multiple myeloma in relapse: Secondary | ICD-10-CM | POA: Diagnosis not present

## 2024-02-15 DIAGNOSIS — R0609 Other forms of dyspnea: Secondary | ICD-10-CM | POA: Diagnosis not present

## 2024-03-01 DIAGNOSIS — C9002 Multiple myeloma in relapse: Secondary | ICD-10-CM | POA: Diagnosis not present

## 2024-03-01 DIAGNOSIS — R0609 Other forms of dyspnea: Secondary | ICD-10-CM | POA: Diagnosis not present

## 2024-03-01 DIAGNOSIS — R0789 Other chest pain: Secondary | ICD-10-CM | POA: Diagnosis not present

## 2024-03-01 DIAGNOSIS — Z5112 Encounter for antineoplastic immunotherapy: Secondary | ICD-10-CM | POA: Diagnosis not present

## 2024-03-07 DIAGNOSIS — H353221 Exudative age-related macular degeneration, left eye, with active choroidal neovascularization: Secondary | ICD-10-CM | POA: Diagnosis not present

## 2024-03-13 ENCOUNTER — Other Ambulatory Visit: Payer: Self-pay | Admitting: Nurse Practitioner

## 2024-03-13 DIAGNOSIS — E782 Mixed hyperlipidemia: Secondary | ICD-10-CM

## 2024-03-15 DIAGNOSIS — R0609 Other forms of dyspnea: Secondary | ICD-10-CM | POA: Diagnosis not present

## 2024-03-15 DIAGNOSIS — R0789 Other chest pain: Secondary | ICD-10-CM | POA: Diagnosis not present

## 2024-03-15 DIAGNOSIS — C9002 Multiple myeloma in relapse: Secondary | ICD-10-CM | POA: Diagnosis not present

## 2024-03-15 DIAGNOSIS — D508 Other iron deficiency anemias: Secondary | ICD-10-CM | POA: Diagnosis not present

## 2024-03-17 DIAGNOSIS — H353112 Nonexudative age-related macular degeneration, right eye, intermediate dry stage: Secondary | ICD-10-CM | POA: Diagnosis not present

## 2024-03-17 DIAGNOSIS — H353221 Exudative age-related macular degeneration, left eye, with active choroidal neovascularization: Secondary | ICD-10-CM | POA: Diagnosis not present

## 2024-03-17 DIAGNOSIS — H353211 Exudative age-related macular degeneration, right eye, with active choroidal neovascularization: Secondary | ICD-10-CM | POA: Diagnosis not present

## 2024-03-17 DIAGNOSIS — H40003 Preglaucoma, unspecified, bilateral: Secondary | ICD-10-CM | POA: Diagnosis not present

## 2024-03-29 DIAGNOSIS — D61818 Other pancytopenia: Secondary | ICD-10-CM | POA: Diagnosis not present

## 2024-03-29 DIAGNOSIS — R0609 Other forms of dyspnea: Secondary | ICD-10-CM | POA: Diagnosis not present

## 2024-03-29 DIAGNOSIS — E041 Nontoxic single thyroid nodule: Secondary | ICD-10-CM | POA: Diagnosis not present

## 2024-03-29 DIAGNOSIS — M25561 Pain in right knee: Secondary | ICD-10-CM | POA: Diagnosis not present

## 2024-03-29 DIAGNOSIS — Z5112 Encounter for antineoplastic immunotherapy: Secondary | ICD-10-CM | POA: Diagnosis not present

## 2024-03-29 DIAGNOSIS — F32A Depression, unspecified: Secondary | ICD-10-CM | POA: Diagnosis not present

## 2024-03-29 DIAGNOSIS — D801 Nonfamilial hypogammaglobulinemia: Secondary | ICD-10-CM | POA: Diagnosis not present

## 2024-03-29 DIAGNOSIS — Z886 Allergy status to analgesic agent status: Secondary | ICD-10-CM | POA: Diagnosis not present

## 2024-03-29 DIAGNOSIS — Z882 Allergy status to sulfonamides status: Secondary | ICD-10-CM | POA: Diagnosis not present

## 2024-03-29 DIAGNOSIS — F419 Anxiety disorder, unspecified: Secondary | ICD-10-CM | POA: Diagnosis not present

## 2024-03-29 DIAGNOSIS — Z79899 Other long term (current) drug therapy: Secondary | ICD-10-CM | POA: Diagnosis not present

## 2024-03-29 DIAGNOSIS — Z885 Allergy status to narcotic agent status: Secondary | ICD-10-CM | POA: Diagnosis not present

## 2024-03-29 DIAGNOSIS — M898X9 Other specified disorders of bone, unspecified site: Secondary | ICD-10-CM | POA: Diagnosis not present

## 2024-03-29 DIAGNOSIS — H3530 Unspecified macular degeneration: Secondary | ICD-10-CM | POA: Diagnosis not present

## 2024-03-29 DIAGNOSIS — Z880 Allergy status to penicillin: Secondary | ICD-10-CM | POA: Diagnosis not present

## 2024-03-29 DIAGNOSIS — C9002 Multiple myeloma in relapse: Secondary | ICD-10-CM | POA: Diagnosis not present

## 2024-03-29 DIAGNOSIS — R0789 Other chest pain: Secondary | ICD-10-CM | POA: Diagnosis not present

## 2024-03-29 DIAGNOSIS — Z881 Allergy status to other antibiotic agents status: Secondary | ICD-10-CM | POA: Diagnosis not present

## 2024-04-17 DIAGNOSIS — H353221 Exudative age-related macular degeneration, left eye, with active choroidal neovascularization: Secondary | ICD-10-CM | POA: Diagnosis not present

## 2024-04-17 DIAGNOSIS — H353211 Exudative age-related macular degeneration, right eye, with active choroidal neovascularization: Secondary | ICD-10-CM | POA: Diagnosis not present

## 2024-04-18 DIAGNOSIS — M25561 Pain in right knee: Secondary | ICD-10-CM | POA: Diagnosis not present

## 2024-04-18 DIAGNOSIS — M79651 Pain in right thigh: Secondary | ICD-10-CM | POA: Diagnosis not present

## 2024-04-18 DIAGNOSIS — C9002 Multiple myeloma in relapse: Secondary | ICD-10-CM | POA: Diagnosis not present

## 2024-04-18 DIAGNOSIS — R0789 Other chest pain: Secondary | ICD-10-CM | POA: Diagnosis not present

## 2024-04-18 DIAGNOSIS — I129 Hypertensive chronic kidney disease with stage 1 through stage 4 chronic kidney disease, or unspecified chronic kidney disease: Secondary | ICD-10-CM | POA: Diagnosis not present

## 2024-04-18 DIAGNOSIS — D749 Methemoglobinemia, unspecified: Secondary | ICD-10-CM | POA: Diagnosis not present

## 2024-04-18 DIAGNOSIS — I340 Nonrheumatic mitral (valve) insufficiency: Secondary | ICD-10-CM | POA: Diagnosis not present

## 2024-04-18 DIAGNOSIS — S2243XA Multiple fractures of ribs, bilateral, initial encounter for closed fracture: Secondary | ICD-10-CM | POA: Diagnosis not present

## 2024-04-18 DIAGNOSIS — K029 Dental caries, unspecified: Secondary | ICD-10-CM | POA: Diagnosis not present

## 2024-04-18 DIAGNOSIS — E876 Hypokalemia: Secondary | ICD-10-CM | POA: Diagnosis not present

## 2024-04-18 DIAGNOSIS — K219 Gastro-esophageal reflux disease without esophagitis: Secondary | ICD-10-CM | POA: Diagnosis not present

## 2024-04-18 DIAGNOSIS — M8448XA Pathological fracture, other site, initial encounter for fracture: Secondary | ICD-10-CM | POA: Diagnosis not present

## 2024-04-18 DIAGNOSIS — E8809 Other disorders of plasma-protein metabolism, not elsewhere classified: Secondary | ICD-10-CM | POA: Diagnosis not present

## 2024-04-18 DIAGNOSIS — E782 Mixed hyperlipidemia: Secondary | ICD-10-CM | POA: Diagnosis not present

## 2024-04-18 DIAGNOSIS — Z1152 Encounter for screening for COVID-19: Secondary | ICD-10-CM | POA: Diagnosis not present

## 2024-04-18 DIAGNOSIS — E871 Hypo-osmolality and hyponatremia: Secondary | ICD-10-CM | POA: Diagnosis not present

## 2024-04-18 DIAGNOSIS — Z5111 Encounter for antineoplastic chemotherapy: Secondary | ICD-10-CM | POA: Diagnosis not present

## 2024-04-18 DIAGNOSIS — Z572 Occupational exposure to dust: Secondary | ICD-10-CM | POA: Diagnosis not present

## 2024-04-18 DIAGNOSIS — T451X5A Adverse effect of antineoplastic and immunosuppressive drugs, initial encounter: Secondary | ICD-10-CM | POA: Diagnosis not present

## 2024-04-18 DIAGNOSIS — R0609 Other forms of dyspnea: Secondary | ICD-10-CM | POA: Diagnosis not present

## 2024-04-18 DIAGNOSIS — Z882 Allergy status to sulfonamides status: Secondary | ICD-10-CM | POA: Diagnosis not present

## 2024-04-18 DIAGNOSIS — D61810 Antineoplastic chemotherapy induced pancytopenia: Secondary | ICD-10-CM | POA: Diagnosis not present

## 2024-04-18 DIAGNOSIS — D89831 Cytokine release syndrome, grade 1: Secondary | ICD-10-CM | POA: Diagnosis not present

## 2024-04-18 DIAGNOSIS — N181 Chronic kidney disease, stage 1: Secondary | ICD-10-CM | POA: Diagnosis not present

## 2024-04-18 DIAGNOSIS — H3530 Unspecified macular degeneration: Secondary | ICD-10-CM | POA: Diagnosis not present

## 2024-04-18 DIAGNOSIS — R0902 Hypoxemia: Secondary | ICD-10-CM | POA: Diagnosis not present

## 2024-04-18 DIAGNOSIS — F411 Generalized anxiety disorder: Secondary | ICD-10-CM | POA: Diagnosis not present

## 2024-04-18 DIAGNOSIS — Z880 Allergy status to penicillin: Secondary | ICD-10-CM | POA: Diagnosis not present

## 2024-04-18 DIAGNOSIS — C9000 Multiple myeloma not having achieved remission: Secondary | ICD-10-CM | POA: Diagnosis not present

## 2024-04-18 DIAGNOSIS — D84821 Immunodeficiency due to drugs: Secondary | ICD-10-CM | POA: Diagnosis not present

## 2024-04-18 DIAGNOSIS — Z881 Allergy status to other antibiotic agents status: Secondary | ICD-10-CM | POA: Diagnosis not present

## 2024-04-18 DIAGNOSIS — D801 Nonfamilial hypogammaglobulinemia: Secondary | ICD-10-CM | POA: Diagnosis not present

## 2024-04-20 DIAGNOSIS — C9000 Multiple myeloma not having achieved remission: Secondary | ICD-10-CM | POA: Diagnosis not present

## 2024-04-27 DIAGNOSIS — R0902 Hypoxemia: Secondary | ICD-10-CM | POA: Diagnosis not present

## 2024-04-30 DIAGNOSIS — D801 Nonfamilial hypogammaglobulinemia: Secondary | ICD-10-CM | POA: Diagnosis not present

## 2024-04-30 DIAGNOSIS — I10 Essential (primary) hypertension: Secondary | ICD-10-CM | POA: Diagnosis not present

## 2024-04-30 DIAGNOSIS — F411 Generalized anxiety disorder: Secondary | ICD-10-CM | POA: Diagnosis not present

## 2024-04-30 DIAGNOSIS — K219 Gastro-esophageal reflux disease without esophagitis: Secondary | ICD-10-CM | POA: Diagnosis not present

## 2024-04-30 DIAGNOSIS — I129 Hypertensive chronic kidney disease with stage 1 through stage 4 chronic kidney disease, or unspecified chronic kidney disease: Secondary | ICD-10-CM | POA: Diagnosis not present

## 2024-04-30 DIAGNOSIS — D702 Other drug-induced agranulocytosis: Secondary | ICD-10-CM | POA: Diagnosis not present

## 2024-04-30 DIAGNOSIS — N200 Calculus of kidney: Secondary | ICD-10-CM | POA: Diagnosis not present

## 2024-04-30 DIAGNOSIS — N181 Chronic kidney disease, stage 1: Secondary | ICD-10-CM | POA: Diagnosis not present

## 2024-04-30 DIAGNOSIS — E782 Mixed hyperlipidemia: Secondary | ICD-10-CM | POA: Diagnosis not present

## 2024-04-30 DIAGNOSIS — C9002 Multiple myeloma in relapse: Secondary | ICD-10-CM | POA: Diagnosis not present

## 2024-04-30 DIAGNOSIS — H353221 Exudative age-related macular degeneration, left eye, with active choroidal neovascularization: Secondary | ICD-10-CM | POA: Diagnosis not present

## 2024-04-30 DIAGNOSIS — D630 Anemia in neoplastic disease: Secondary | ICD-10-CM | POA: Diagnosis not present

## 2024-04-30 DIAGNOSIS — E538 Deficiency of other specified B group vitamins: Secondary | ICD-10-CM | POA: Diagnosis not present

## 2024-04-30 DIAGNOSIS — F32A Depression, unspecified: Secondary | ICD-10-CM | POA: Diagnosis not present

## 2024-04-30 DIAGNOSIS — M8448XD Pathological fracture, other site, subsequent encounter for fracture with routine healing: Secondary | ICD-10-CM | POA: Diagnosis not present

## 2024-04-30 DIAGNOSIS — D509 Iron deficiency anemia, unspecified: Secondary | ICD-10-CM | POA: Diagnosis not present

## 2024-05-08 ENCOUNTER — Telehealth: Payer: Self-pay

## 2024-05-08 NOTE — Telephone Encounter (Signed)
 Gave verbal to [facility] to Sherry Oneal [phone] for Physical therapy twice a week for 3 weeks and 1 times a week for 6 week

## 2024-05-10 ENCOUNTER — Telehealth: Payer: Self-pay

## 2024-05-10 NOTE — Telephone Encounter (Signed)
 Gave verbal to physical therapy to angie from [facility] [phone]  to decreased a physical therapy once a week for 9 week

## 2024-05-11 DIAGNOSIS — R0789 Other chest pain: Secondary | ICD-10-CM | POA: Diagnosis not present

## 2024-05-11 DIAGNOSIS — D801 Nonfamilial hypogammaglobulinemia: Secondary | ICD-10-CM | POA: Diagnosis not present

## 2024-05-11 DIAGNOSIS — R0609 Other forms of dyspnea: Secondary | ICD-10-CM | POA: Diagnosis not present

## 2024-05-11 DIAGNOSIS — N181 Chronic kidney disease, stage 1: Secondary | ICD-10-CM | POA: Diagnosis not present

## 2024-05-11 DIAGNOSIS — Z5112 Encounter for antineoplastic immunotherapy: Secondary | ICD-10-CM | POA: Diagnosis not present

## 2024-05-11 DIAGNOSIS — C9002 Multiple myeloma in relapse: Secondary | ICD-10-CM | POA: Diagnosis not present

## 2024-05-12 DIAGNOSIS — C9002 Multiple myeloma in relapse: Secondary | ICD-10-CM | POA: Diagnosis not present

## 2024-05-15 DIAGNOSIS — H353231 Exudative age-related macular degeneration, bilateral, with active choroidal neovascularization: Secondary | ICD-10-CM | POA: Diagnosis not present

## 2024-05-15 DIAGNOSIS — H353211 Exudative age-related macular degeneration, right eye, with active choroidal neovascularization: Secondary | ICD-10-CM | POA: Diagnosis not present

## 2024-05-16 DIAGNOSIS — C9002 Multiple myeloma in relapse: Secondary | ICD-10-CM | POA: Diagnosis not present

## 2024-05-19 DIAGNOSIS — C9002 Multiple myeloma in relapse: Secondary | ICD-10-CM | POA: Diagnosis not present

## 2024-05-23 DIAGNOSIS — R0789 Other chest pain: Secondary | ICD-10-CM | POA: Diagnosis not present

## 2024-05-23 DIAGNOSIS — C9002 Multiple myeloma in relapse: Secondary | ICD-10-CM | POA: Diagnosis not present

## 2024-05-23 DIAGNOSIS — F32A Depression, unspecified: Secondary | ICD-10-CM | POA: Diagnosis not present

## 2024-05-23 DIAGNOSIS — E785 Hyperlipidemia, unspecified: Secondary | ICD-10-CM | POA: Diagnosis not present

## 2024-05-23 DIAGNOSIS — Z79899 Other long term (current) drug therapy: Secondary | ICD-10-CM | POA: Diagnosis not present

## 2024-05-23 DIAGNOSIS — I129 Hypertensive chronic kidney disease with stage 1 through stage 4 chronic kidney disease, or unspecified chronic kidney disease: Secondary | ICD-10-CM | POA: Diagnosis not present

## 2024-05-23 DIAGNOSIS — D801 Nonfamilial hypogammaglobulinemia: Secondary | ICD-10-CM | POA: Diagnosis not present

## 2024-05-23 DIAGNOSIS — H3530 Unspecified macular degeneration: Secondary | ICD-10-CM | POA: Diagnosis not present

## 2024-05-23 DIAGNOSIS — F419 Anxiety disorder, unspecified: Secondary | ICD-10-CM | POA: Diagnosis not present

## 2024-05-23 DIAGNOSIS — D61818 Other pancytopenia: Secondary | ICD-10-CM | POA: Diagnosis not present

## 2024-05-23 DIAGNOSIS — R438 Other disturbances of smell and taste: Secondary | ICD-10-CM | POA: Diagnosis not present

## 2024-05-23 DIAGNOSIS — E041 Nontoxic single thyroid nodule: Secondary | ICD-10-CM | POA: Diagnosis not present

## 2024-05-23 DIAGNOSIS — N181 Chronic kidney disease, stage 1: Secondary | ICD-10-CM | POA: Diagnosis not present

## 2024-05-23 DIAGNOSIS — R0609 Other forms of dyspnea: Secondary | ICD-10-CM | POA: Diagnosis not present

## 2024-05-23 DIAGNOSIS — L299 Pruritus, unspecified: Secondary | ICD-10-CM | POA: Diagnosis not present

## 2024-05-23 DIAGNOSIS — Z5112 Encounter for antineoplastic immunotherapy: Secondary | ICD-10-CM | POA: Diagnosis not present

## 2024-05-26 DIAGNOSIS — C9002 Multiple myeloma in relapse: Secondary | ICD-10-CM | POA: Diagnosis not present

## 2024-05-30 ENCOUNTER — Encounter: Payer: Self-pay | Admitting: Internal Medicine

## 2024-05-30 ENCOUNTER — Other Ambulatory Visit: Payer: Self-pay | Admitting: Nurse Practitioner

## 2024-05-30 DIAGNOSIS — C9002 Multiple myeloma in relapse: Secondary | ICD-10-CM | POA: Diagnosis not present

## 2024-05-30 DIAGNOSIS — Z1231 Encounter for screening mammogram for malignant neoplasm of breast: Secondary | ICD-10-CM

## 2024-06-02 DIAGNOSIS — C9002 Multiple myeloma in relapse: Secondary | ICD-10-CM | POA: Diagnosis not present

## 2024-06-08 DIAGNOSIS — R0789 Other chest pain: Secondary | ICD-10-CM | POA: Diagnosis not present

## 2024-06-08 DIAGNOSIS — R0609 Other forms of dyspnea: Secondary | ICD-10-CM | POA: Diagnosis not present

## 2024-06-08 DIAGNOSIS — D801 Nonfamilial hypogammaglobulinemia: Secondary | ICD-10-CM | POA: Diagnosis not present

## 2024-06-08 DIAGNOSIS — C9002 Multiple myeloma in relapse: Secondary | ICD-10-CM | POA: Diagnosis not present

## 2024-06-08 DIAGNOSIS — N181 Chronic kidney disease, stage 1: Secondary | ICD-10-CM | POA: Diagnosis not present

## 2024-06-12 DIAGNOSIS — H353221 Exudative age-related macular degeneration, left eye, with active choroidal neovascularization: Secondary | ICD-10-CM | POA: Diagnosis not present

## 2024-06-12 DIAGNOSIS — H353211 Exudative age-related macular degeneration, right eye, with active choroidal neovascularization: Secondary | ICD-10-CM | POA: Diagnosis not present

## 2024-06-15 ENCOUNTER — Ambulatory Visit
Admission: RE | Admit: 2024-06-15 | Discharge: 2024-06-15 | Disposition: A | Discharge status: Home / Self Care | Source: Ambulatory Visit | Attending: Nurse Practitioner | Admitting: Nurse Practitioner

## 2024-06-15 ENCOUNTER — Other Ambulatory Visit: Payer: Self-pay | Admitting: Nurse Practitioner

## 2024-06-15 DIAGNOSIS — E782 Mixed hyperlipidemia: Secondary | ICD-10-CM

## 2024-06-15 DIAGNOSIS — Z1231 Encounter for screening mammogram for malignant neoplasm of breast: Secondary | ICD-10-CM | POA: Insufficient documentation

## 2024-06-19 ENCOUNTER — Other Ambulatory Visit: Payer: Self-pay | Admitting: Nurse Practitioner

## 2024-06-19 DIAGNOSIS — R928 Other abnormal and inconclusive findings on diagnostic imaging of breast: Secondary | ICD-10-CM

## 2024-06-20 ENCOUNTER — Encounter: Payer: Self-pay | Admitting: Nurse Practitioner

## 2024-06-20 ENCOUNTER — Ambulatory Visit (INDEPENDENT_AMBULATORY_CARE_PROVIDER_SITE_OTHER): Payer: PPO | Admitting: Nurse Practitioner

## 2024-06-20 VITALS — BP 130/82 | HR 67 | Temp 98.0°F | Resp 16 | Ht 63.0 in | Wt 126.0 lb

## 2024-06-20 DIAGNOSIS — I10 Essential (primary) hypertension: Secondary | ICD-10-CM

## 2024-06-20 DIAGNOSIS — H3530 Unspecified macular degeneration: Secondary | ICD-10-CM | POA: Diagnosis not present

## 2024-06-20 DIAGNOSIS — E782 Mixed hyperlipidemia: Secondary | ICD-10-CM

## 2024-06-20 DIAGNOSIS — K219 Gastro-esophageal reflux disease without esophagitis: Secondary | ICD-10-CM

## 2024-06-20 DIAGNOSIS — C9000 Multiple myeloma not having achieved remission: Secondary | ICD-10-CM | POA: Diagnosis not present

## 2024-06-20 DIAGNOSIS — F411 Generalized anxiety disorder: Secondary | ICD-10-CM | POA: Diagnosis not present

## 2024-06-20 MED ORDER — ALPRAZOLAM 0.25 MG PO TABS: 0.2500 mg | ORAL_TABLET | Freq: Two times a day (BID) | ORAL | 2 refills | Status: AC | PRN

## 2024-06-20 MED ORDER — ATORVASTATIN CALCIUM 10 MG PO TABS: 10.0000 mg | ORAL_TABLET | Freq: Every day | ORAL | 3 refills | Status: AC

## 2024-06-20 NOTE — Progress Notes (Signed)
 [facility] [address] [place], [place] [address]  Internal MEDICINE  Office Visit Note  Patient Name: Sherry Oneal  [id number]  [id number]  Date of Service: 06/20/2024  Chief Complaint  Patient presents with   Hyperlipidemia   Hypertension   Follow-up    HPI Sherry Oneal presents for a follow-up visit for hypertension, anxiety, and high cholesterol.  Recent mammogram was abnormal, has a diagnostic mammogram planned Had a recent pet scan, checmo was not working so they changed the medication again. Hyperlipidemia -- currently taking atorvastatin  Anxiety -- takes alprazolam  as needed.  GERD -- controlled with omeprazole   Current Medication: Outpatient Encounter Medications as of 06/20/2024  Medication Sig Note   diclofenac Sodium (VOLTAREN) 1 % GEL Apply 2 g topically.    erythromycin ophthalmic ointment SMARTSIG:sparingly Left Eye Every Night    lidocaine  4 % Place 1 patch onto the skin.    TALQUETAMAB -TGVS [place] Inject into the skin.    acetaminophen  (TYLENOL ) 500 MG tablet Take 650 mg by mouth in the morning and at bedtime.    ALPRAZolam  (XANAX ) 0.25 MG tablet Take 1 tablet (0.25 mg total) by mouth 2 (two) times daily as needed for anxiety or sleep.    atorvastatin  (LIPITOR) 10 MG tablet Take 1 tablet (10 mg total) by mouth daily.    atovaquone (MEPRON) 750 MG/5ML suspension Take by mouth.    Cranberry 500 MG CAPS Take 500 mg by mouth every morning.    dapsone  100 MG tablet Take 100 mg by mouth daily.    dexamethasone  (DECADRON ) 4 MG tablet Take 20 mg by mouth daily. 12/14/2022: For chemo   dicyclomine  (BENTYL ) 10 MG capsule TAKE 1 CAPSULE BY MOUTH THREE TIMES DAILY AS NEEDED WITH MEALS    diphenhydrAMINE  (BENADRYL ) 25 MG tablet Take 25 mg by mouth daily as needed.    faricimab -svoa (VABYSMO ) 6 MG/0.05ML SOLN intravitreal injection by Intravitreal route.    fluticasone  (FLONASE ) 50 MCG/ACT nasal spray Use 2 spray(s) in each nostril once daily    loperamide  (IMODIUM )  2 MG capsule Take 2 mg by mouth.    Multiple Vitamins-Minerals (PRESERVISION AREDS PO) Take 1 tablet by mouth every morning.    omeprazole (PRILOSEC) 20 MG capsule Take 20 mg by mouth daily.    ondansetron  (ZOFRAN -ODT) 4 MG disintegrating tablet DISSOLVE 1 TABLET IN MOUTH EVERY 8 HOURS AS NEEDED FOR NAUSEA    oxyCODONE  (OXY IR/ROXICODONE ) 5 MG immediate release tablet Take 5 mg by mouth every 4 (four) hours as needed.    polyethylene glycol (MIRALAX  / GLYCOLAX ) packet Take 17 g by mouth daily as needed.    prochlorperazine  (COMPAZINE ) 5 MG tablet Take 5 mg by mouth every 8 (eight) hours as needed.    promethazine  (PHENERGAN ) 25 MG tablet Take 1 tablet (25 mg total) by mouth every 6 (six) hours as needed for nausea or vomiting. (Patient taking differently: Take 12.5 mg by mouth every 6 (six) hours as needed for nausea or vomiting.)    senna (SENOKOT) 8.6 MG tablet Take 1 tablet by mouth daily as needed for constipation.    traZODone  (DESYREL ) 50 MG tablet Take 50 mg by mouth at bedtime.    valACYclovir  (VALTREX ) 500 MG tablet Take 500 mg by mouth daily.    vitamin B-12 (CYANOCOBALAMIN ) 500 MCG tablet Take 500 mcg by mouth daily.    [DISCONTINUED] ALPRAZolam  (XANAX ) 0.25 MG tablet Take 1 tablet (0.25 mg total) by mouth 2 (two) times daily as needed for anxiety or  sleep.    [DISCONTINUED] atorvastatin  (LIPITOR) 10 MG tablet Take 1 tablet by mouth once daily    [DISCONTINUED] TECLISTAMAB -CQYV [place] Inject into the skin.    No facility-administered encounter medications on file as of 06/20/2024.    Surgical History: Past Surgical History:  Procedure Laterality Date   CATARACT EXTRACTION W/PHACO Right 08/17/2018   Procedure: CATARACT EXTRACTION PHACO AND INTRAOCULAR LENS PLACEMENT (IOC)  RIGHT;  Surgeon: Mittie Gaskin, MD;  Location: [facility];  Service: Ophthalmology;  Laterality: Right;   CATARACT EXTRACTION W/PHACO Left 11/09/2018   Procedure: CATARACT EXTRACTION PHACO AND  INTRAOCULAR LENS PLACEMENT (IOC) LEFT;  Surgeon: Mittie Gaskin, MD;  Location: [facility];  Service: Ophthalmology;  Laterality: Left;   EXPLORATORY LAPAROTOMY      Medical History: Past Medical History:  Diagnosis Date   Anemia    Arthritis    lower back   High blood pressure    High cholesterol    History of actinic keratosis    Macular degeneration    recieving 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    Multiple myeloma (HCC)    Renal insufficiency    Wears dentures    partial upper    Family History: Family History  Problem Relation Age of Onset   Breast cancer Maternal Grandmother 26   CAD Mother    CVA Mother     Social History   Socioeconomic History   Marital status: Married    Spouse name: Not on file   Number of children: 1   Years of education: Not on file   Highest education level: Not on file  Occupational History   Not on file  Tobacco Use   Smoking status: Never   Smokeless tobacco: Never  Vaping Use   Vaping status: Never Used  Substance and Sexual Activity   Alcohol use: No   Drug use: No   Sexual activity: Not Currently    Birth control/protection: None  Other Topics Concern   Not on file  Social History Narrative   Lives at home with her husband, independent at baseline   Social Drivers of Health   Financial Resource Strain: Low Risk  (04/19/2024)   Received from [facility]   Overall Financial Resource Strain (CARDIA)    Difficulty of Paying Living Expenses: Not hard at all  Food Insecurity: No Food Insecurity (04/19/2024)   Received from [facility]   Hunger Vital Sign    Within the past 12 months, you worried that your food would run out before you got the money to buy more.: Never true    Within the past 12 months, the food you bought just didn't last and you didn't have money to get more.: Never true  Transportation Needs: No Transportation Needs (04/19/2024)   Received from [facility]   PRAPARE - Transportation    Lack of Transportation (Medical): No    Lack of Transportation (Non-Medical): No  Physical Activity: Inactive (03/22/2018)   Exercise Vital Sign    Days of Exercise per Week: 0 days    Minutes of Exercise per Session: 0 min  Stress: No Stress Concern Present (03/22/2018)   [employer] of Occupational Health - Occupational Stress Questionnaire    Feeling of Stress : Only a little  Social Connections: Moderately Integrated (03/22/2018)   Social Connection and Isolation Panel    Frequency of Communication with Friends and Family: More than three times a week  Frequency of Social Gatherings with Friends and Family: Three times a week    Attends Religious Services: 1 to 4 times per year    Active Member of Clubs or Organizations: No    Attends Banker Meetings: Never    Marital Status: Married  Catering manager Violence: Not At Risk (02/22/2023)   Humiliation, Afraid, Rape, and Kick questionnaire    Fear of Current or Ex-Partner: No    Emotionally Abused: No    Physically Abused: No    Sexually Abused: No      Review of Systems  Constitutional:  Negative for chills, fatigue and unexpected weight change.  HENT:  Negative for congestion, rhinorrhea, sneezing and sore throat.   Eyes:  Negative for redness.  Respiratory: Negative.  Negative for cough, chest tightness, shortness of breath and wheezing.   Cardiovascular: Negative.  Negative for chest pain and palpitations.  Gastrointestinal:  Negative for abdominal pain, constipation, diarrhea, nausea and vomiting.  Genitourinary:  Negative for dysuria and frequency.  Musculoskeletal:  Positive for arthralgias and back pain. Negative for joint swelling and neck pain.  Skin:  Negative for rash.  Neurological: Negative.  Negative for tremors and numbness.  Hematological:  Negative for adenopathy. Does not bruise/bleed easily.  Psychiatric/Behavioral:  Negative for behavioral  problems (Depression), self-injury, sleep disturbance and suicidal ideas. The patient is nervous/anxious.     Vital Signs: BP 130/82   Pulse 67   Temp 98 F (36.7 C)   Resp 16   Ht 5' 3 (1.6 m)   Wt 126 lb (57.2 kg)   SpO2 98%   BMI 22.32 kg/m    Physical Exam Vitals reviewed.  Constitutional:      General: She is not in acute distress.    Appearance: Normal appearance. She is normal weight. She is not ill-appearing.  HENT:     Head: Normocephalic and atraumatic.  Eyes:     Pupils: Pupils are equal, round, and reactive to light.  Cardiovascular:     Rate and Rhythm: Normal rate and regular rhythm.  Pulmonary:     Effort: Pulmonary effort is normal. No respiratory distress.  Neurological:     Mental Status: She is alert and oriented to person, place, and time.  Psychiatric:        Mood and Affect: Mood normal.        Behavior: Behavior normal.        Assessment/Plan: 1. Gastroesophageal reflux disease without esophagitis (Primary) Continue omeprazole as prescribed.   2. Mixed hyperlipidemia Continue atorvastatin  as prescribed.  - atorvastatin  (LIPITOR) 10 MG tablet; Take 1 tablet (10 mg total) by mouth daily.  Dispense: 90 tablet; Refill: 3  3. Essential hypertension Stable, not currently on any medication   4. Macular degeneration of both eyes, unspecified type Followed by ophthalmology closely   5. Multiple myeloma not having achieved remission (HCC) Followed by oncology, receiving chemo treatments  6. Generalized anxiety disorder Continue alprazolam  as needed, follow up in 3 months for additional refills  - ALPRAZolam  (XANAX ) 0.25 MG tablet; Take 1 tablet (0.25 mg total) by mouth 2 (two) times daily as needed for anxiety or sleep.  Dispense: 60 tablet; Refill: 2   General Counseling: Sherry Oneal verbalizes understanding of the findings of todays visit and agrees with plan of treatment. I have discussed any further diagnostic evaluation that may be needed or  ordered today. We also reviewed her medications today. she has been encouraged to call the office with any questions or  concerns that should arise related to todays visit.    No orders of the defined types were placed in this encounter.   Meds ordered this encounter  Medications   ALPRAZolam  (XANAX ) 0.25 MG tablet    Sig: Take 1 tablet (0.25 mg total) by mouth 2 (two) times daily as needed for anxiety or sleep.    Dispense:  60 tablet    Refill:  2    Refills ordered   atorvastatin  (LIPITOR) 10 MG tablet    Sig: Take 1 tablet (10 mg total) by mouth daily.    Dispense:  90 tablet    Refill:  3    Return in about 3 months (around 09/13/2024) for F/U, anxiety med refill, Sherry Oneal PCP.   Total time spent:30 Minutes Time spent includes review of chart, medications, test results, and follow up plan with the patient.   [place] Controlled Substance Database was reviewed by me.  This patient was seen by Mardy Maxin, FNP-C in collaboration with Dr. Sigrid Bathe as a part of collaborative care agreement.   Sherry Oneal R. Maxin, MSN, FNP-C Internal medicine

## 2024-06-21 DIAGNOSIS — N181 Chronic kidney disease, stage 1: Secondary | ICD-10-CM | POA: Diagnosis not present

## 2024-06-21 DIAGNOSIS — R0789 Other chest pain: Secondary | ICD-10-CM | POA: Diagnosis not present

## 2024-06-21 DIAGNOSIS — L309 Dermatitis, unspecified: Secondary | ICD-10-CM | POA: Diagnosis not present

## 2024-06-21 DIAGNOSIS — D801 Nonfamilial hypogammaglobulinemia: Secondary | ICD-10-CM | POA: Diagnosis not present

## 2024-06-21 DIAGNOSIS — Z5112 Encounter for antineoplastic immunotherapy: Secondary | ICD-10-CM | POA: Diagnosis not present

## 2024-06-21 DIAGNOSIS — R432 Parageusia: Secondary | ICD-10-CM | POA: Diagnosis not present

## 2024-06-21 DIAGNOSIS — R0609 Other forms of dyspnea: Secondary | ICD-10-CM | POA: Diagnosis not present

## 2024-06-21 DIAGNOSIS — C9002 Multiple myeloma in relapse: Secondary | ICD-10-CM | POA: Diagnosis not present

## 2024-06-22 ENCOUNTER — Ambulatory Visit
Admission: RE | Admit: 2024-06-22 | Discharge: 2024-06-22 | Disposition: A | Discharge status: Home / Self Care | Source: Ambulatory Visit | Attending: Nurse Practitioner | Admitting: Nurse Practitioner

## 2024-06-22 DIAGNOSIS — R928 Other abnormal and inconclusive findings on diagnostic imaging of breast: Secondary | ICD-10-CM | POA: Insufficient documentation

## 2024-06-22 DIAGNOSIS — R92333 Mammographic heterogeneous density, bilateral breasts: Secondary | ICD-10-CM | POA: Diagnosis not present

## 2024-07-05 DIAGNOSIS — C9002 Multiple myeloma in relapse: Secondary | ICD-10-CM | POA: Diagnosis not present

## 2024-07-05 DIAGNOSIS — D801 Nonfamilial hypogammaglobulinemia: Secondary | ICD-10-CM | POA: Diagnosis not present

## 2024-07-05 DIAGNOSIS — Z87892 Personal history of anaphylaxis: Secondary | ICD-10-CM | POA: Diagnosis not present

## 2024-07-05 DIAGNOSIS — Z885 Allergy status to narcotic agent status: Secondary | ICD-10-CM | POA: Diagnosis not present

## 2024-07-05 DIAGNOSIS — N181 Chronic kidney disease, stage 1: Secondary | ICD-10-CM | POA: Diagnosis not present

## 2024-07-05 DIAGNOSIS — Z882 Allergy status to sulfonamides status: Secondary | ICD-10-CM | POA: Diagnosis not present

## 2024-07-05 DIAGNOSIS — Z880 Allergy status to penicillin: Secondary | ICD-10-CM | POA: Diagnosis not present

## 2024-07-05 DIAGNOSIS — Z883 Allergy status to other anti-infective agents status: Secondary | ICD-10-CM | POA: Diagnosis not present

## 2024-07-05 DIAGNOSIS — Z888 Allergy status to other drugs, medicaments and biological substances status: Secondary | ICD-10-CM | POA: Diagnosis not present

## 2024-07-05 DIAGNOSIS — Z91041 Radiographic dye allergy status: Secondary | ICD-10-CM | POA: Diagnosis not present

## 2024-07-05 DIAGNOSIS — Z881 Allergy status to other antibiotic agents status: Secondary | ICD-10-CM | POA: Diagnosis not present

## 2024-07-05 DIAGNOSIS — Z886 Allergy status to analgesic agent status: Secondary | ICD-10-CM | POA: Diagnosis not present

## 2024-07-17 DIAGNOSIS — H353231 Exudative age-related macular degeneration, bilateral, with active choroidal neovascularization: Secondary | ICD-10-CM | POA: Diagnosis not present

## 2024-07-18 DIAGNOSIS — R0609 Other forms of dyspnea: Secondary | ICD-10-CM | POA: Diagnosis not present

## 2024-07-18 DIAGNOSIS — R0789 Other chest pain: Secondary | ICD-10-CM | POA: Diagnosis not present

## 2024-07-18 DIAGNOSIS — N181 Chronic kidney disease, stage 1: Secondary | ICD-10-CM | POA: Diagnosis not present

## 2024-07-18 DIAGNOSIS — D801 Nonfamilial hypogammaglobulinemia: Secondary | ICD-10-CM | POA: Diagnosis not present

## 2024-07-18 DIAGNOSIS — C9002 Multiple myeloma in relapse: Secondary | ICD-10-CM | POA: Diagnosis not present

## 2024-07-24 ENCOUNTER — Encounter: Payer: Self-pay | Admitting: Nurse Practitioner

## 2024-07-25 ENCOUNTER — Ambulatory Visit (INDEPENDENT_AMBULATORY_CARE_PROVIDER_SITE_OTHER): Admitting: Internal Medicine

## 2024-07-25 ENCOUNTER — Encounter: Payer: Self-pay | Admitting: Internal Medicine

## 2024-07-25 VITALS — BP 133/76 | HR 79 | Temp 97.6°F | Resp 16 | Ht 63.0 in | Wt 126.0 lb

## 2024-07-25 DIAGNOSIS — C9000 Multiple myeloma not having achieved remission: Secondary | ICD-10-CM

## 2024-07-25 DIAGNOSIS — R030 Elevated blood-pressure reading, without diagnosis of hypertension: Secondary | ICD-10-CM | POA: Diagnosis not present

## 2024-07-25 MED ORDER — CLONIDINE HCL 0.1 MG PO TABS: ORAL_TABLET | ORAL | 1 refills | Status: AC

## 2024-07-25 NOTE — Progress Notes (Signed)
 [facility] [address] [place], [place] [address]  Internal MEDICINE  Office Visit Note  Patient Name: Sherry Oneal  [id number]  [id number]  Date of Service: 09/25/2024  Chief Complaint  Patient presents with   Acute Visit   Hypertension    BP is fluctuating, patient has a log of readings.    HPI Pt is seen for concerns of elevated and fluctuating bp  Denies any other problems Has multiple Myeloma     Current Medication: Outpatient Encounter Medications as of 07/25/2024  Medication Sig Note   acetaminophen  (TYLENOL ) 500 MG tablet Take 650 mg by mouth in the morning and at bedtime.    Aflibercept  (EYLEA  IO) Inject into the eye every 5 (five) weeks.    ALPRAZolam  (XANAX ) 0.25 MG tablet Take 1 tablet (0.25 mg total) by mouth 2 (two) times daily as needed for anxiety or sleep.    atorvastatin  (LIPITOR) 10 MG tablet Take 1 tablet (10 mg total) by mouth daily.    atovaquone (MEPRON) 750 MG/5ML suspension Take by mouth.    cloNIDine  (CATAPRES ) 0.1 MG tablet Take one tab po every day more bp greater than 160    Cranberry 500 MG CAPS Take 500 mg by mouth every morning.    diclofenac Sodium (VOLTAREN) 1 % GEL Apply 2 g topically.    faricimab -svoa (VABYSMO ) 6 MG/0.05ML SOLN intravitreal injection by Intravitreal route.    lidocaine  4 % Place 1 patch onto the skin.    Multiple Vitamins-Minerals (PRESERVISION AREDS PO) Take 1 tablet by mouth every morning.    omeprazole (PRILOSEC) 20 MG capsule Take 20 mg by mouth daily.    ondansetron  (ZOFRAN -ODT) 4 MG disintegrating tablet DISSOLVE 1 TABLET IN MOUTH EVERY 8 HOURS AS NEEDED FOR NAUSEA    oxyCODONE  (OXY IR/ROXICODONE ) 5 MG immediate release tablet Take 5 mg by mouth every 4 (four) hours as needed.    TALQUETAMAB -TGVS [place] Inject into the skin.    valACYclovir  (VALTREX ) 500 MG tablet Take 500 mg by mouth daily.    vitamin B-12 (CYANOCOBALAMIN ) 500 MCG tablet Take 500 mcg by mouth daily.    Zoledronic  Acid (ZOMETA  IV) Inject  into the vein every 3 (three) months.    [DISCONTINUED] dapsone  100 MG tablet Take 100 mg by mouth daily.    [DISCONTINUED] dexamethasone  (DECADRON ) 4 MG tablet Take 20 mg by mouth daily. 12/14/2022: For chemo   [DISCONTINUED] dicyclomine  (BENTYL ) 10 MG capsule TAKE 1 CAPSULE BY MOUTH THREE TIMES DAILY AS NEEDED WITH MEALS    [DISCONTINUED] diphenhydrAMINE  (BENADRYL ) 25 MG tablet Take 25 mg by mouth daily as needed.    [DISCONTINUED] erythromycin ophthalmic ointment SMARTSIG:sparingly Left Eye Every Night    [DISCONTINUED] fluticasone  (FLONASE ) 50 MCG/ACT nasal spray Use 2 spray(s) in each nostril once daily    [DISCONTINUED] loperamide  (IMODIUM ) 2 MG capsule Take 2 mg by mouth.    [DISCONTINUED] polyethylene glycol (MIRALAX  / GLYCOLAX ) packet Take 17 g by mouth daily as needed.    [DISCONTINUED] prochlorperazine  (COMPAZINE ) 5 MG tablet Take 5 mg by mouth every 8 (eight) hours as needed.    [DISCONTINUED] promethazine  (PHENERGAN ) 25 MG tablet Take 1 tablet (25 mg total) by mouth every 6 (six) hours as needed for nausea or vomiting. (Patient taking differently: Take 12.5 mg by mouth every 6 (six) hours as needed for nausea or vomiting.)    [DISCONTINUED] senna (SENOKOT) 8.6 MG tablet Take 1 tablet by mouth daily as needed for constipation.    [DISCONTINUED] traZODone  (DESYREL )  50 MG tablet Take 50 mg by mouth at bedtime.    No facility-administered encounter medications on file as of 07/25/2024.    Surgical History: Past Surgical History:  Procedure Laterality Date   CATARACT EXTRACTION W/PHACO Right 08/17/2018   Procedure: CATARACT EXTRACTION PHACO AND INTRAOCULAR LENS PLACEMENT (IOC)  RIGHT;  Surgeon: Mittie Gaskin, MD;  Location: [facility];  Service: Ophthalmology;  Laterality: Right;   CATARACT EXTRACTION W/PHACO Left 11/09/2018   Procedure: CATARACT EXTRACTION PHACO AND INTRAOCULAR LENS PLACEMENT (IOC) LEFT;  Surgeon: Mittie Gaskin, MD;  Location: [facility];   Service: Ophthalmology;  Laterality: Left;   EXPLORATORY LAPAROTOMY      Medical History: Past Medical History:  Diagnosis Date   Anemia    Arthritis    lower back   High blood pressure    High cholesterol    History of actinic keratosis    Macular degeneration    recieving 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    Multiple myeloma (HCC)    Renal insufficiency    Wears dentures    partial upper    Family History: Family History  Problem Relation Age of Onset   Breast cancer Maternal Grandmother 41   CAD Mother    CVA Mother     Social History   Socioeconomic History   Marital status: Married    Spouse name: Not on file   Number of children: 1   Years of education: Not on file   Highest education level: Not on file  Occupational History   Not on file  Tobacco Use   Smoking status: Never   Smokeless tobacco: Never  Vaping Use   Vaping status: Never Used  Substance and Sexual Activity   Alcohol use: No   Drug use: No   Sexual activity: Not Currently    Birth control/protection: None  Other Topics Concern   Not on file  Social History Narrative   Lives at home with her husband, independent at baseline   Social Drivers of Health   Financial Resource Strain: Low Risk  (09/15/2024)   Received from [facility]   Overall Financial Resource Strain (CARDIA)    Difficulty of Paying Living Expenses: Not hard at all  Food Insecurity: No Food Insecurity (09/15/2024)   [facility]   Hunger Vital Sign    Within the past 12 months, you worried that your food would run out before you got the money to buy more.: Never true    Within the past 12 months, the food you bought just didn't last and you didn't have money to get more.: Never true  Transportation Needs: No Transportation Needs (09/15/2024)   Received from [facility] - [facility]    In the past 12 months, has  lack of transportation kept you from medical appointments or from getting medications?: No    Lack of Transportation (Non-Medical): No  Physical Activity: Inactive (03/22/2018)   Exercise Vital Sign    Days of Exercise per Week: 0 days    Minutes of Exercise per Session: 0 min  Stress: No Stress Concern Present (03/22/2018)   [employer] of Occupational Health - Occupational Stress Questionnaire    Feeling of Stress : Only a little  Social Connections: Moderately Integrated (03/22/2018)   Social Connection and Isolation Panel    Frequency of Communication with Friends and Family: More than three times a week  Frequency of Social Gatherings with Friends and Family: Three times a week    Attends Religious Services: 1 to 4 times per year    Active Member of Clubs or Organizations: No    Attends Banker Meetings: Never    Marital Status: Married  Catering manager Violence: Not At Risk (02/22/2023)   Humiliation, Afraid, Rape, and Kick questionnaire    Fear of Current or Ex-Partner: No    Emotionally Abused: No    Physically Abused: No    Sexually Abused: No      Review of Systems  Constitutional:  Negative for fatigue and fever.  HENT:  Negative for congestion, mouth sores and postnasal drip.   Respiratory:  Negative for cough.   Cardiovascular:  Negative for chest pain.  Genitourinary:  Negative for flank pain.  Psychiatric/Behavioral: Negative.      Vital Signs: BP 133/76   Pulse 79   Temp 97.6 F (36.4 C)   Resp 16   Ht 5' 3 (1.6 m)   Wt 126 lb (57.2 kg)   SpO2 98%   BMI 22.32 kg/m    Physical Exam Constitutional:      Appearance: Normal appearance.  HENT:     Head: Normocephalic and atraumatic.     Nose: Nose normal.     Mouth/Throat:     Mouth: Mucous membranes are moist.     Pharynx: No posterior oropharyngeal erythema.  Eyes:     Extraocular Movements: Extraocular movements intact.     Pupils: Pupils are equal, round, and reactive to  light.  Cardiovascular:     Pulses: Normal pulses.     Heart sounds: Normal heart sounds.  Pulmonary:     Effort: Pulmonary effort is normal.     Breath sounds: Normal breath sounds.  Neurological:     General: No focal deficit present.     Mental Status: She is alert.  Psychiatric:        Mood and Affect: Mood normal.        Behavior: Behavior normal.        Assessment/Plan: 1. Elevated systolic blood pressure reading without diagnosis of hypertension (Primary) Instructed her to monitor BP, take Clonidine  prn for elevated bp > 160  2. Multiple myeloma not having achieved remission (HCC) Followed by heme/onco   General Counseling: [facility] [place] understanding of the findings of todays visit and agrees with plan of treatment. I have discussed any further diagnostic evaluation that may be needed or ordered today. We also reviewed her medications today. she has been encouraged to call the office with any questions or concerns that should arise related to todays visit.    No orders of the defined types were placed in this encounter.   Meds ordered this encounter  Medications   cloNIDine  (CATAPRES ) 0.1 MG tablet    Sig: Take one tab po every day more bp greater than 160    Dispense:  20 tablet    Refill:  1    Total time spent:30 Minutes Time spent includes review of chart, medications, test results, and follow up plan with the patient.   [place] Controlled Substance Database was reviewed by me.   Dr Swayze Pries M Arjay Jaskiewicz Internal medicine

## 2024-08-02 DIAGNOSIS — R0789 Other chest pain: Secondary | ICD-10-CM | POA: Diagnosis not present

## 2024-08-02 DIAGNOSIS — D801 Nonfamilial hypogammaglobulinemia: Secondary | ICD-10-CM | POA: Diagnosis not present

## 2024-08-02 DIAGNOSIS — Z5112 Encounter for antineoplastic immunotherapy: Secondary | ICD-10-CM | POA: Diagnosis not present

## 2024-08-02 DIAGNOSIS — R0609 Other forms of dyspnea: Secondary | ICD-10-CM | POA: Diagnosis not present

## 2024-08-02 DIAGNOSIS — C9002 Multiple myeloma in relapse: Secondary | ICD-10-CM | POA: Diagnosis not present

## 2024-08-02 DIAGNOSIS — N181 Chronic kidney disease, stage 1: Secondary | ICD-10-CM | POA: Diagnosis not present

## 2024-08-16 DIAGNOSIS — F32A Depression, unspecified: Secondary | ICD-10-CM | POA: Diagnosis not present

## 2024-08-16 DIAGNOSIS — R0609 Other forms of dyspnea: Secondary | ICD-10-CM | POA: Diagnosis not present

## 2024-08-16 DIAGNOSIS — D801 Nonfamilial hypogammaglobulinemia: Secondary | ICD-10-CM | POA: Diagnosis not present

## 2024-08-16 DIAGNOSIS — D61818 Other pancytopenia: Secondary | ICD-10-CM | POA: Diagnosis not present

## 2024-08-16 DIAGNOSIS — G629 Polyneuropathy, unspecified: Secondary | ICD-10-CM | POA: Diagnosis not present

## 2024-08-16 DIAGNOSIS — I129 Hypertensive chronic kidney disease with stage 1 through stage 4 chronic kidney disease, or unspecified chronic kidney disease: Secondary | ICD-10-CM | POA: Diagnosis not present

## 2024-08-16 DIAGNOSIS — H3530 Unspecified macular degeneration: Secondary | ICD-10-CM | POA: Diagnosis not present

## 2024-08-16 DIAGNOSIS — C9002 Multiple myeloma in relapse: Secondary | ICD-10-CM | POA: Diagnosis not present

## 2024-08-16 DIAGNOSIS — F419 Anxiety disorder, unspecified: Secondary | ICD-10-CM | POA: Diagnosis not present

## 2024-08-16 DIAGNOSIS — Z79899 Other long term (current) drug therapy: Secondary | ICD-10-CM | POA: Diagnosis not present

## 2024-08-16 DIAGNOSIS — E041 Nontoxic single thyroid nodule: Secondary | ICD-10-CM | POA: Diagnosis not present

## 2024-08-16 DIAGNOSIS — R0789 Other chest pain: Secondary | ICD-10-CM | POA: Diagnosis not present

## 2024-08-16 DIAGNOSIS — T451X5A Adverse effect of antineoplastic and immunosuppressive drugs, initial encounter: Secondary | ICD-10-CM | POA: Diagnosis not present

## 2024-08-16 DIAGNOSIS — N181 Chronic kidney disease, stage 1: Secondary | ICD-10-CM | POA: Diagnosis not present

## 2024-08-21 DIAGNOSIS — H353211 Exudative age-related macular degeneration, right eye, with active choroidal neovascularization: Secondary | ICD-10-CM | POA: Diagnosis not present

## 2024-08-21 DIAGNOSIS — H353221 Exudative age-related macular degeneration, left eye, with active choroidal neovascularization: Secondary | ICD-10-CM | POA: Diagnosis not present

## 2024-08-30 DIAGNOSIS — D801 Nonfamilial hypogammaglobulinemia: Secondary | ICD-10-CM | POA: Diagnosis not present

## 2024-08-30 DIAGNOSIS — Z5112 Encounter for antineoplastic immunotherapy: Secondary | ICD-10-CM | POA: Diagnosis not present

## 2024-08-30 DIAGNOSIS — R0609 Other forms of dyspnea: Secondary | ICD-10-CM | POA: Diagnosis not present

## 2024-08-30 DIAGNOSIS — R0789 Other chest pain: Secondary | ICD-10-CM | POA: Diagnosis not present

## 2024-08-30 DIAGNOSIS — C9002 Multiple myeloma in relapse: Secondary | ICD-10-CM | POA: Diagnosis not present

## 2024-08-30 DIAGNOSIS — N181 Chronic kidney disease, stage 1: Secondary | ICD-10-CM | POA: Diagnosis not present

## 2024-08-31 ENCOUNTER — Telehealth: Payer: Self-pay

## 2024-08-31 NOTE — Progress Notes (Signed)
   08/31/2024  Patient ID: Sherry Oneal, female   DOB: 05-Apr-1946, 78 y.o.   MRN: [id number]  This patient is appearing on a report for being at risk of failing the adherence measure for identified medications this calendar year.   Medication Adherence Summary (STAR/HEDIS Monitoring): Adherence Category: cholesterol (statin)    Drug Name: Atorvastatin  10 mg  Last Fill or Sold Date:06/20/2024 Days' Supply: 90  Notes: ? Adherence data pulled from pharmacy claims portal Dr. Annemarie. ? Reviewed barriers to adherence: N/A. ? Plan: Follow up prior to next fill in mid-September  Dorcas Solian, PharmD Clinical Pharmacist Cell: [phone]

## 2024-09-11 ENCOUNTER — Other Ambulatory Visit: Payer: Self-pay | Admitting: Nurse Practitioner

## 2024-09-11 DIAGNOSIS — K529 Noninfective gastroenteritis and colitis, unspecified: Secondary | ICD-10-CM

## 2024-09-13 DIAGNOSIS — R0789 Other chest pain: Secondary | ICD-10-CM | POA: Diagnosis not present

## 2024-09-13 DIAGNOSIS — R0609 Other forms of dyspnea: Secondary | ICD-10-CM | POA: Diagnosis not present

## 2024-09-13 DIAGNOSIS — N181 Chronic kidney disease, stage 1: Secondary | ICD-10-CM | POA: Diagnosis not present

## 2024-09-13 DIAGNOSIS — D801 Nonfamilial hypogammaglobulinemia: Secondary | ICD-10-CM | POA: Diagnosis not present

## 2024-09-13 DIAGNOSIS — C9002 Multiple myeloma in relapse: Secondary | ICD-10-CM | POA: Diagnosis not present

## 2024-09-15 DIAGNOSIS — M79674 Pain in right toe(s): Secondary | ICD-10-CM | POA: Diagnosis not present

## 2024-09-15 DIAGNOSIS — M79675 Pain in left toe(s): Secondary | ICD-10-CM | POA: Diagnosis not present

## 2024-09-15 DIAGNOSIS — M778 Other enthesopathies, not elsewhere classified: Secondary | ICD-10-CM | POA: Diagnosis not present

## 2024-09-15 DIAGNOSIS — B351 Tinea unguium: Secondary | ICD-10-CM | POA: Diagnosis not present

## 2024-09-15 DIAGNOSIS — L600 Ingrowing nail: Secondary | ICD-10-CM | POA: Diagnosis not present

## 2024-09-19 DIAGNOSIS — H353211 Exudative age-related macular degeneration, right eye, with active choroidal neovascularization: Secondary | ICD-10-CM | POA: Diagnosis not present

## 2024-09-19 DIAGNOSIS — H353221 Exudative age-related macular degeneration, left eye, with active choroidal neovascularization: Secondary | ICD-10-CM | POA: Diagnosis not present

## 2024-09-26 DIAGNOSIS — Z961 Presence of intraocular lens: Secondary | ICD-10-CM | POA: Diagnosis not present

## 2024-09-26 DIAGNOSIS — H40003 Preglaucoma, unspecified, bilateral: Secondary | ICD-10-CM | POA: Diagnosis not present

## 2024-09-26 DIAGNOSIS — H43813 Vitreous degeneration, bilateral: Secondary | ICD-10-CM | POA: Diagnosis not present

## 2024-09-27 DIAGNOSIS — C9002 Multiple myeloma in relapse: Secondary | ICD-10-CM | POA: Diagnosis not present

## 2024-09-27 DIAGNOSIS — R0609 Other forms of dyspnea: Secondary | ICD-10-CM | POA: Diagnosis not present

## 2024-09-27 DIAGNOSIS — R0789 Other chest pain: Secondary | ICD-10-CM | POA: Diagnosis not present

## 2024-09-27 DIAGNOSIS — N181 Chronic kidney disease, stage 1: Secondary | ICD-10-CM | POA: Diagnosis not present

## 2024-09-27 DIAGNOSIS — Z5112 Encounter for antineoplastic immunotherapy: Secondary | ICD-10-CM | POA: Diagnosis not present

## 2024-09-27 DIAGNOSIS — D801 Nonfamilial hypogammaglobulinemia: Secondary | ICD-10-CM | POA: Diagnosis not present

## 2024-10-11 DIAGNOSIS — D61818 Other pancytopenia: Secondary | ICD-10-CM | POA: Diagnosis not present

## 2024-10-11 DIAGNOSIS — D801 Nonfamilial hypogammaglobulinemia: Secondary | ICD-10-CM | POA: Diagnosis not present

## 2024-10-11 DIAGNOSIS — L731 Pseudofolliculitis barbae: Secondary | ICD-10-CM | POA: Diagnosis not present

## 2024-10-11 DIAGNOSIS — R0789 Other chest pain: Secondary | ICD-10-CM | POA: Diagnosis not present

## 2024-10-11 DIAGNOSIS — I129 Hypertensive chronic kidney disease with stage 1 through stage 4 chronic kidney disease, or unspecified chronic kidney disease: Secondary | ICD-10-CM | POA: Diagnosis not present

## 2024-10-11 DIAGNOSIS — N181 Chronic kidney disease, stage 1: Secondary | ICD-10-CM | POA: Diagnosis not present

## 2024-10-11 DIAGNOSIS — C9002 Multiple myeloma in relapse: Secondary | ICD-10-CM | POA: Diagnosis not present

## 2024-10-11 DIAGNOSIS — E041 Nontoxic single thyroid nodule: Secondary | ICD-10-CM | POA: Diagnosis not present

## 2024-10-11 DIAGNOSIS — G629 Polyneuropathy, unspecified: Secondary | ICD-10-CM | POA: Diagnosis not present

## 2024-10-11 DIAGNOSIS — Z23 Encounter for immunization: Secondary | ICD-10-CM | POA: Diagnosis not present

## 2024-10-11 DIAGNOSIS — H3530 Unspecified macular degeneration: Secondary | ICD-10-CM | POA: Diagnosis not present

## 2024-10-11 DIAGNOSIS — F419 Anxiety disorder, unspecified: Secondary | ICD-10-CM | POA: Diagnosis not present

## 2024-10-11 DIAGNOSIS — R110 Nausea: Secondary | ICD-10-CM | POA: Diagnosis not present

## 2024-10-11 DIAGNOSIS — F32A Depression, unspecified: Secondary | ICD-10-CM | POA: Diagnosis not present

## 2024-10-11 DIAGNOSIS — R0609 Other forms of dyspnea: Secondary | ICD-10-CM | POA: Diagnosis not present

## 2024-10-11 DIAGNOSIS — R432 Parageusia: Secondary | ICD-10-CM | POA: Diagnosis not present

## 2024-10-13 DIAGNOSIS — B351 Tinea unguium: Secondary | ICD-10-CM | POA: Diagnosis not present

## 2024-10-13 DIAGNOSIS — L600 Ingrowing nail: Secondary | ICD-10-CM | POA: Diagnosis not present

## 2024-10-23 DIAGNOSIS — H353221 Exudative age-related macular degeneration, left eye, with active choroidal neovascularization: Secondary | ICD-10-CM | POA: Diagnosis not present

## 2024-10-23 DIAGNOSIS — H353211 Exudative age-related macular degeneration, right eye, with active choroidal neovascularization: Secondary | ICD-10-CM | POA: Diagnosis not present

## 2024-10-25 DIAGNOSIS — Z95828 Presence of other vascular implants and grafts: Secondary | ICD-10-CM | POA: Diagnosis not present

## 2024-10-25 DIAGNOSIS — C9002 Multiple myeloma in relapse: Secondary | ICD-10-CM | POA: Diagnosis not present

## 2024-10-25 DIAGNOSIS — R0789 Other chest pain: Secondary | ICD-10-CM | POA: Diagnosis not present

## 2024-10-25 DIAGNOSIS — N181 Chronic kidney disease, stage 1: Secondary | ICD-10-CM | POA: Diagnosis not present

## 2024-10-25 DIAGNOSIS — Z5112 Encounter for antineoplastic immunotherapy: Secondary | ICD-10-CM | POA: Diagnosis not present

## 2024-10-25 DIAGNOSIS — D801 Nonfamilial hypogammaglobulinemia: Secondary | ICD-10-CM | POA: Diagnosis not present

## 2024-10-25 DIAGNOSIS — R0609 Other forms of dyspnea: Secondary | ICD-10-CM | POA: Diagnosis not present

## 2024-11-08 DIAGNOSIS — E0789 Other specified disorders of thyroid: Secondary | ICD-10-CM | POA: Diagnosis not present

## 2024-11-08 DIAGNOSIS — D801 Nonfamilial hypogammaglobulinemia: Secondary | ICD-10-CM | POA: Diagnosis not present

## 2024-11-08 DIAGNOSIS — R0789 Other chest pain: Secondary | ICD-10-CM | POA: Diagnosis not present

## 2024-11-08 DIAGNOSIS — R0609 Other forms of dyspnea: Secondary | ICD-10-CM | POA: Diagnosis not present

## 2024-11-08 DIAGNOSIS — N181 Chronic kidney disease, stage 1: Secondary | ICD-10-CM | POA: Diagnosis not present

## 2024-11-08 DIAGNOSIS — C9002 Multiple myeloma in relapse: Secondary | ICD-10-CM | POA: Diagnosis not present

## 2024-11-13 DIAGNOSIS — L600 Ingrowing nail: Secondary | ICD-10-CM | POA: Diagnosis not present

## 2024-11-20 DIAGNOSIS — H353221 Exudative age-related macular degeneration, left eye, with active choroidal neovascularization: Secondary | ICD-10-CM | POA: Diagnosis not present

## 2024-11-20 DIAGNOSIS — H353211 Exudative age-related macular degeneration, right eye, with active choroidal neovascularization: Secondary | ICD-10-CM | POA: Diagnosis not present

## 2024-11-22 DIAGNOSIS — Z5112 Encounter for antineoplastic immunotherapy: Secondary | ICD-10-CM | POA: Diagnosis not present

## 2024-11-22 DIAGNOSIS — N181 Chronic kidney disease, stage 1: Secondary | ICD-10-CM | POA: Diagnosis not present

## 2024-11-22 DIAGNOSIS — R0789 Other chest pain: Secondary | ICD-10-CM | POA: Diagnosis not present

## 2024-11-22 DIAGNOSIS — R0609 Other forms of dyspnea: Secondary | ICD-10-CM | POA: Diagnosis not present

## 2024-11-22 DIAGNOSIS — D801 Nonfamilial hypogammaglobulinemia: Secondary | ICD-10-CM | POA: Diagnosis not present

## 2024-11-22 DIAGNOSIS — C9002 Multiple myeloma in relapse: Secondary | ICD-10-CM | POA: Diagnosis not present

## 2024-11-27 ENCOUNTER — Telehealth: Payer: Self-pay

## 2024-11-27 DIAGNOSIS — N189 Chronic kidney disease, unspecified: Secondary | ICD-10-CM

## 2024-11-27 DIAGNOSIS — L600 Ingrowing nail: Secondary | ICD-10-CM | POA: Diagnosis not present

## 2024-11-27 DIAGNOSIS — B351 Tinea unguium: Secondary | ICD-10-CM | POA: Diagnosis not present

## 2024-11-27 NOTE — Progress Notes (Signed)
 Complex Care Management Note  Care Guide Note 11/27/2024 Name: Sherry Oneal MRN: [id number] DOB: [birth year]  Sherry Oneal is a 78 y.o. year old female who sees Liana Fish, NP for primary care. I reached out to Rock LULLA Cleaves by phone today to offer complex care management services.  [name] was given information about Complex Care Management services today including:   The Complex Care Management services include support from the care team which includes your Nurse Care Manager, Clinical Social Worker, or Pharmacist.  The Complex Care Management team is here to help remove barriers to the health concerns and goals most important to you. Complex Care Management services are voluntary, and the patient may decline or stop services at any time by request to their care team member.   Complex Care Management Consent Status: Patient did not agree to participate in complex care management services at this time.  Encounter Outcome:  Patient Refused  [place]reama Agent [facility], [facility] Assistant Direct Dial: [phone]  Fax: [phone]

## 2024-12-04 ENCOUNTER — Other Ambulatory Visit: Payer: Self-pay | Admitting: Nurse Practitioner

## 2024-12-25 ENCOUNTER — Ambulatory Visit (INDEPENDENT_AMBULATORY_CARE_PROVIDER_SITE_OTHER): Payer: PPO | Admitting: Nurse Practitioner

## 2024-12-25 ENCOUNTER — Encounter: Payer: Self-pay | Admitting: Nurse Practitioner

## 2024-12-25 VITALS — BP 136/80 | HR 79 | Temp 96.5°F | Resp 16 | Ht 63.0 in | Wt 123.6 lb

## 2024-12-25 DIAGNOSIS — C9000 Multiple myeloma not having achieved remission: Secondary | ICD-10-CM | POA: Diagnosis not present

## 2024-12-25 DIAGNOSIS — I10 Essential (primary) hypertension: Secondary | ICD-10-CM | POA: Diagnosis not present

## 2024-12-25 DIAGNOSIS — Z0001 Encounter for general adult medical examination with abnormal findings: Secondary | ICD-10-CM | POA: Diagnosis not present

## 2024-12-25 DIAGNOSIS — E782 Mixed hyperlipidemia: Secondary | ICD-10-CM

## 2024-12-25 DIAGNOSIS — H353130 Nonexudative age-related macular degeneration, bilateral, stage unspecified: Secondary | ICD-10-CM

## 2024-12-25 NOTE — Progress Notes (Signed)
 [facility] [address] [place], [place] [address]  Internal MEDICINE  Office Visit Note  Patient Name: Sherry Oneal  [id number]  [id number]  Date of Service: 12/25/2024  Chief Complaint  Patient presents with   Hyperlipidemia   Hypertension   Medicare Wellness    HPI [place] presents for a medicare annual wellness visit.  Well-appearing 78 y.o. female with multiple myeloma, hypertension, GERD, anxiety, low B12 level, and insomnia  Routine CRC screening: discontinued, aged out Routine mammogram: done in June this year, repeat in June next year  DEXA scan: discontinued  Labs: gets routine labs via oncology. May need cholesterol panel checked, last done in 2022.  New or worsening pain: chronic pain due to multiple myeloma. But this is controlled with her current chemotherapy treatment.  Other concerns: [facility] oncology -- multiple myeloma Goes to [facility] for macular degeneration -- gets injections for this  Dermatology -- goes to [facility] dermatology -- diagnosed with seborrheic keratoses.      12/25/2024    8:50 AM 12/20/2023    9:02 AM 12/14/2022    8:59 AM  MMSE - Mini Mental State Exam  Orientation to time 5 5 5   Orientation to Place 5 5 5   Registration 3 3 3   Attention/ Calculation 5 5 5   Recall 3 3 3   Language- name 2 objects 2 2 2   Language- repeat 1 1 1   Language- follow 3 step command 3 3 3   Language- read & follow direction 1 1 1   Write a sentence 1 1 1   Copy design 1 1 1   Total score 30 30 30     Functional Status Survey: Is the patient deaf or have difficulty hearing?: Yes Does the patient have difficulty seeing, even when wearing glasses/contacts?: Yes Does the patient have difficulty concentrating, remembering, or making decisions?: No Does the patient have difficulty walking or climbing stairs?: No Does the patient have difficulty dressing or bathing?: No Does the patient have difficulty doing errands alone such as visiting  a doctor's office or shopping?: No     04/16/2022   10:30 AM 08/13/2022    8:27 AM 12/14/2022    8:57 AM 12/20/2023    9:00 AM 12/25/2024    8:49 AM  Fall Risk  Falls in the past year? 0 0 0 0 0  Was there an injury with Fall?   0  0  0  Fall Risk Category Calculator   0 0 0  Fall Risk Category (Retired)   Low     (RETIRED) Patient Fall Risk Level Low fall risk   Low fall risk     Patient at Risk for Falls Due to No Fall Risks  No Fall Risks No Fall Risks   Fall risk Follow up Falls evaluation completed   Falls evaluation completed  Falls evaluation completed Falls evaluation completed     Data saved with a previous flowsheet row definition       12/25/2024    8:49 AM  Depression screen PHQ 2/9  Decreased Interest 0  Down, Depressed, Hopeless 0  PHQ - 2 Score 0       Current Medication: Outpatient Encounter Medications as of 12/25/2024  Medication Sig   acetaminophen  (TYLENOL ) 500 MG tablet Take 650 mg by mouth in the morning and at bedtime.   Aflibercept  (EYLEA  IO) Inject into the eye every 5 (five) weeks.   ALPRAZolam  (XANAX ) 0.25 MG tablet Take 1 tablet (0.25 mg total) by mouth  2 (two) times daily as needed for anxiety or sleep.   atorvastatin  (LIPITOR) 10 MG tablet Take 1 tablet (10 mg total) by mouth daily.   atovaquone (MEPRON) 750 MG/5ML suspension Take by mouth. (Patient not taking: Reported on 12/25/2024)   cloNIDine  (CATAPRES ) 0.1 MG tablet Take one tab po every day more bp greater than 160   Cranberry 500 MG CAPS Take 500 mg by mouth every morning.   diclofenac Sodium (VOLTAREN) 1 % GEL Apply 2 g topically.   dicyclomine  (BENTYL ) 10 MG capsule TAKE 1 CAPSULE BY MOUTH THREE TIMES DAILY AS NEEDED WITH MEALS   faricimab -svoa (VABYSMO ) 6 MG/0.05ML SOLN intravitreal injection by Intravitreal route. (Patient not taking: Reported on 12/25/2024)   fluticasone  (FLONASE ) 50 MCG/ACT nasal spray Use 2 spray(s) in each nostril once daily   lidocaine  4 % Place 1 patch onto  the skin.   Multiple Vitamins-Minerals (PRESERVISION AREDS PO) Take 1 tablet by mouth every morning.   omeprazole (PRILOSEC) 20 MG capsule Take 20 mg by mouth daily.   ondansetron  (ZOFRAN -ODT) 4 MG disintegrating tablet DISSOLVE 1 TABLET IN MOUTH EVERY 8 HOURS AS NEEDED FOR NAUSEA   oxyCODONE  (OXY IR/ROXICODONE ) 5 MG immediate release tablet Take 5 mg by mouth every 4 (four) hours as needed. (Patient not taking: Reported on 12/25/2024)   TALQUETAMAB -TGVS [place] Inject into the skin.   valACYclovir  (VALTREX ) 500 MG tablet Take 500 mg by mouth daily.   vitamin B-12 (CYANOCOBALAMIN ) 500 MCG tablet Take 500 mcg by mouth daily.   Zoledronic  Acid (ZOMETA  IV) Inject into the vein every 3 (three) months.   No facility-administered encounter medications on file as of 12/25/2024.    Surgical History: Past Surgical History:  Procedure Laterality Date   CATARACT EXTRACTION W/PHACO Right 08/17/2018   Procedure: CATARACT EXTRACTION PHACO AND INTRAOCULAR LENS PLACEMENT (IOC)  RIGHT;  Surgeon: Mittie Gaskin, MD;  Location: [facility];  Service: Ophthalmology;  Laterality: Right;   CATARACT EXTRACTION W/PHACO Left 11/09/2018   Procedure: CATARACT EXTRACTION PHACO AND INTRAOCULAR LENS PLACEMENT (IOC) LEFT;  Surgeon: Mittie Gaskin, MD;  Location: [facility];  Service: Ophthalmology;  Laterality: Left;   EXPLORATORY LAPAROTOMY      Medical History: Past Medical History:  Diagnosis Date   Anemia    Arthritis    lower back   High blood pressure    High cholesterol    History of actinic keratosis    Macular degeneration    recieving 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    Multiple myeloma (HCC)    Renal insufficiency    Wears dentures    partial upper    Family History: Family History  Problem Relation Age of Onset   Breast cancer Maternal Grandmother 51   CAD Mother    CVA Mother     Social History   Socioeconomic History   Marital  status: Married    Spouse name: Not on file   Number of children: 1   Years of education: Not on file   Highest education level: Not on file  Occupational History   Not on file  Tobacco Use   Smoking status: Never   Smokeless tobacco: Never  Vaping Use   Vaping status: Never Used  Substance and Sexual Activity   Alcohol use: No   Drug use: No   Sexual activity: Not Currently    Birth control/protection: None  Other Topics Concern   Not on file  Social History Narrative  Lives at home with her husband, independent at baseline   Social Drivers of Health   Tobacco Use: Low Risk (12/25/2024)   Patient History    Smoking Tobacco Use: Never    Smokeless Tobacco Use: Never    Passive Exposure: Not on file  Financial Resource Strain: Low Risk  (10/13/2024)   Received from [facility]   Overall Financial Resource Strain (CARDIA)    Difficulty of Paying Living Expenses: Not hard at all  Food Insecurity: No Food Insecurity (10/13/2024)   Received from [facility]   Epic    Within the past 12 months, you worried that your food would run out before you got the money to buy more.: Never true    Within the past 12 months, the food you bought just didn't last and you didn't have money to get more.: Never true  Transportation Needs: No Transportation Needs (10/13/2024)   Received from [facility] - Transportation    In the past 12 months, has lack of transportation kept you from medical appointments or from getting medications?: No    Lack of Transportation (Non-Medical): No  Physical Activity: Not on file  Stress: Not on file  Social Connections: Not on file  Intimate Partner Violence: Not At Risk (02/22/2023)   Humiliation, Afraid, Rape, and Kick questionnaire    Fear of Current or Ex-Partner: No    Emotionally Abused: No    Physically Abused: No    Sexually Abused: No  Depression (PHQ2-9): Low Risk (12/25/2024)    Depression (PHQ2-9)    PHQ-2 Score: 0  Alcohol Screen: Low Risk (04/16/2022)   Alcohol Screen    Last Alcohol Screening Score (AUDIT): 0  Housing: Low Risk  (11/27/2024)   Received from [facility]   Epic    In the last 12 months, was there a time when you were not able to pay the mortgage or rent on time?: No    In the past 12 months, how many times have you moved where you were living?: 0    At any time in the past 12 months, were you homeless or living in a shelter (including now)?: No  Utilities: Not At Risk (10/13/2024)   Received from [facility]   Epic    In the past 12 months has the electric, gas, oil, or water company threatened to shut off services in your home?: No  Health Literacy: Low Risk (11/13/2022)   Received from [facility] Literacy    How often do you need to have someone help you when you read instructions, pamphlets, or other written material from your doctor or pharmacy?: Never      Review of Systems  Constitutional:  Positive for appetite change and fatigue. Negative for activity change, chills, fever and unexpected weight change.  HENT:  Negative for congestion, dental problem, ear pain, rhinorrhea, sore throat and trouble swallowing.   Eyes: Negative.   Respiratory:  Negative for cough, chest tightness, shortness of breath and wheezing.   Cardiovascular: Negative.  Negative for chest pain and palpitations.  Gastrointestinal:  Positive for nausea. Negative for abdominal pain, blood in stool, constipation, diarrhea and vomiting.  Endocrine: Negative.   Genitourinary: Negative.  Negative for difficulty urinating, dysuria, frequency, hematuria and urgency.  Musculoskeletal: Negative.  Negative for arthralgias, back pain, joint swelling, myalgias and neck pain.  Skin: Negative.  Negative for rash and wound.  Allergic/Immunologic: Negative.  Negative for immunocompromised state.  Neurological: Negative.  Negative  for dizziness, seizures, numbness and headaches.  Hematological: Negative.   Psychiatric/Behavioral: Negative.  Negative for behavioral problems, self-injury and suicidal ideas. The patient is not nervous/anxious.     Vital Signs: BP 136/80   Pulse 79   Temp (!) 96.5 F (35.8 C)   Resp 16   Ht 5' 3 (1.6 m)   Wt 123 lb 9.6 oz (56.1 kg)   SpO2 98%   BMI 21.89 kg/m    Physical Exam Vitals reviewed.  Constitutional:      General: She is awake. She is not in acute distress.    Appearance: Normal appearance. She is well-developed and well-groomed. She is obese. She is not ill-appearing or diaphoretic.  HENT:     Head: Normocephalic and atraumatic.     Jaw: Tenderness, pain on movement and malocclusion present.     Right Ear: Tympanic membrane, ear canal and external ear normal.     Left Ear: Tympanic membrane, ear canal and external ear normal.     Nose: Nose normal. No congestion or rhinorrhea.     Mouth/Throat:     Lips: Pink.     Mouth: Mucous membranes are moist.     Pharynx: Oropharynx is clear. Uvula midline. No oropharyngeal exudate or posterior oropharyngeal erythema.  Eyes:     General: Lids are normal. Vision grossly intact. Gaze aligned appropriately. No scleral icterus.       Right eye: No discharge.        Left eye: No discharge.     Extraocular Movements: Extraocular movements intact.     Conjunctiva/sclera: Conjunctivae normal.     Pupils: Pupils are equal, round, and reactive to light.     Funduscopic exam:    Right eye: Red reflex present.        Left eye: Red reflex present. Neck:     Thyroid : No thyromegaly.     Vascular: No JVD.     Trachea: Trachea and phonation normal. No tracheal deviation.  Cardiovascular:     Rate and Rhythm: Normal rate and regular rhythm.     Pulses: Normal pulses.     Heart sounds: Normal heart sounds, S1 normal and S2 normal. No murmur heard.    No friction rub. No gallop.  Pulmonary:     Effort: Pulmonary effort is normal.  Tachypnea present. No accessory muscle usage or respiratory distress.     Breath sounds: Normal breath sounds and air entry. No stridor. No wheezing or rales.  Chest:     Chest wall: No tenderness.     Comments: Declined clinical breast exam Abdominal:     General: Bowel sounds are normal. There is no distension.     Palpations: Abdomen is soft. There is no shifting dullness, fluid wave, mass or pulsatile mass.     Tenderness: There is no abdominal tenderness. There is no guarding or rebound.  Musculoskeletal:        General: No tenderness or deformity. Normal range of motion.     Cervical back: Normal range of motion and neck supple.     Right lower leg: No edema.     Left lower leg: No edema.  Lymphadenopathy:     Cervical: No cervical adenopathy.  Skin:    General: Skin is warm and dry.     Capillary Refill: Capillary refill takes less than 2 seconds.     Coloration: Skin is not pale.  Findings: No erythema or rash.  Neurological:     Mental Status: She is alert and oriented to person, place, and time.     Cranial Nerves: No cranial nerve deficit.     Motor: No abnormal muscle tone.     Coordination: Coordination normal.     Gait: Gait normal.     Deep Tendon Reflexes: Reflexes are normal and symmetric.  Psychiatric:        Mood and Affect: Mood normal.        Behavior: Behavior normal. Behavior is cooperative.        Thought Content: Thought content normal.        Judgment: Judgment normal.        Assessment/Plan: 1. Encounter for Medicare annual examination with abnormal findings (Primary) Age-appropriate preventive screenings and vaccinations discussed. Routine labs for health maintenance results are up to date at this time. PHM updated.    2. Essential hypertension Stable, continue medication as prescribed.   3. Mixed hyperlipidemia Checked in December last year. Mildly elevated. Triglycerides rechecked in April and were mildly elevated. Continue atorvastatin   as prescribed.   4. Multiple myeloma not having achieved remission (HCC) Currently on a new chemo drug she started in may this year. This medication is helping and she does not have any pain related to her multiple myeloma at this time.   5. Bilateral nonexudative age-related macular degeneration, unspecified stage Sees ophthalmology regularly for surveillance and treatment.      General Counseling: aurore redinger understanding of the findings of todays visit and agrees with plan of treatment. I have discussed any further diagnostic evaluation that may be needed or ordered today. We also reviewed her medications today. she has been encouraged to call the office with any questions or concerns that should arise related to todays visit.    No orders of the defined types were placed in this encounter.   No orders of the defined types were placed in this encounter.   Return in about 6 months (around 06/25/2025) for F/U, Haidyn Chadderdon PCP.   Total time spent:30 Minutes Time spent includes review of chart, medications, test results, and follow up plan with the patient.   [place] Controlled Substance Database was reviewed by me.  This patient was seen by Mardy Maxin, FNP-C in collaboration with Dr. Sigrid Bathe as a part of collaborative care agreement.  Christy Friede R. Maxin, MSN, FNP-C Internal medicine

## 2025-06-28 ENCOUNTER — Ambulatory Visit: Admitting: Nurse Practitioner

## 2025-12-26 ENCOUNTER — Ambulatory Visit: Admitting: Nurse Practitioner
